# Patient Record
Sex: Male | Born: 1935 | Race: White | Hispanic: No | Marital: Married | Smoking: Never smoker
Health system: Southern US, Community
[De-identification: ages and names within clinical notes are randomized; demographics above are authoritative.]

## PROBLEM LIST (undated history)

## (undated) DIAGNOSIS — I421 Obstructive hypertrophic cardiomyopathy: Secondary | ICD-10-CM

## (undated) DIAGNOSIS — I69251 Hemiplegia and hemiparesis following other nontraumatic intracranial hemorrhage affecting right dominant side: Secondary | ICD-10-CM

## (undated) DIAGNOSIS — I959 Hypotension, unspecified: Secondary | ICD-10-CM

## (undated) DIAGNOSIS — I69351 Hemiplegia and hemiparesis following cerebral infarction affecting right dominant side: Secondary | ICD-10-CM

## (undated) DIAGNOSIS — D696 Thrombocytopenia, unspecified: Secondary | ICD-10-CM

## (undated) DIAGNOSIS — I69391 Dysphagia following cerebral infarction: Secondary | ICD-10-CM

## (undated) DIAGNOSIS — R410 Disorientation, unspecified: Secondary | ICD-10-CM

## (undated) DIAGNOSIS — R1312 Dysphagia, oropharyngeal phase: Secondary | ICD-10-CM

## (undated) DIAGNOSIS — R569 Unspecified convulsions: Secondary | ICD-10-CM

## (undated) DIAGNOSIS — M25551 Pain in right hip: Secondary | ICD-10-CM

## (undated) DIAGNOSIS — R498 Other voice and resonance disorders: Secondary | ICD-10-CM

## (undated) DIAGNOSIS — I69128 Other speech and language deficits following nontraumatic intracerebral hemorrhage: Secondary | ICD-10-CM

## (undated) DIAGNOSIS — I10 Essential (primary) hypertension: Secondary | ICD-10-CM

## (undated) DIAGNOSIS — T7840XA Allergy, unspecified, initial encounter: Secondary | ICD-10-CM

## (undated) DIAGNOSIS — F419 Anxiety disorder, unspecified: Secondary | ICD-10-CM

## (undated) DIAGNOSIS — M6281 Muscle weakness (generalized): Secondary | ICD-10-CM

## (undated) DIAGNOSIS — I69320 Aphasia following cerebral infarction: Secondary | ICD-10-CM

## (undated) DIAGNOSIS — S72001D Fracture of unspecified part of neck of right femur, subsequent encounter for closed fracture with routine healing: Secondary | ICD-10-CM

## (undated) DIAGNOSIS — M47812 Spondylosis without myelopathy or radiculopathy, cervical region: Secondary | ICD-10-CM

## (undated) DIAGNOSIS — K5901 Slow transit constipation: Secondary | ICD-10-CM

## (undated) DIAGNOSIS — S42112D Displaced fracture of body of scapula, left shoulder, subsequent encounter for fracture with routine healing: Secondary | ICD-10-CM

## (undated) DIAGNOSIS — I447 Left bundle-branch block, unspecified: Secondary | ICD-10-CM

## (undated) DIAGNOSIS — Z9889 Other specified postprocedural states: Secondary | ICD-10-CM

## (undated) DIAGNOSIS — J300 Vasomotor rhinitis: Secondary | ICD-10-CM

## (undated) DIAGNOSIS — F331 Major depressive disorder, recurrent, moderate: Secondary | ICD-10-CM

## (undated) DIAGNOSIS — G4700 Insomnia, unspecified: Secondary | ICD-10-CM

## (undated) DIAGNOSIS — I480 Paroxysmal atrial fibrillation: Secondary | ICD-10-CM

## (undated) DIAGNOSIS — E785 Hyperlipidemia, unspecified: Secondary | ICD-10-CM

## (undated) DIAGNOSIS — I69918 Other symptoms and signs involving cognitive functions following unspecified cerebrovascular disease: Secondary | ICD-10-CM

## (undated) HISTORY — PX: INGUINAL HERNIA REPAIR: SHX194

## (undated) HISTORY — DX: Allergy, unspecified, initial encounter: T78.40XA

---
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
## 2017-02-08 ENCOUNTER — Encounter: Payer: Self-pay | Admitting: Physical Therapy

## 2017-02-08 ENCOUNTER — Ambulatory Visit: Payer: Medicare Other | Attending: Orthopedic Surgery | Admitting: Physical Therapy

## 2017-02-08 ENCOUNTER — Other Ambulatory Visit: Payer: Self-pay

## 2017-02-08 DIAGNOSIS — R2689 Other abnormalities of gait and mobility: Secondary | ICD-10-CM | POA: Insufficient documentation

## 2017-02-08 DIAGNOSIS — M542 Cervicalgia: Secondary | ICD-10-CM

## 2017-02-08 DIAGNOSIS — M791 Myalgia, unspecified site: Secondary | ICD-10-CM | POA: Insufficient documentation

## 2017-02-08 NOTE — Patient Instructions (Signed)
[facility]  (dragging arms without lifting   10  Reps     _______  Back of head, and upper arms pressed with slight chin tuck    5 sec holds with breathing x 5      ________  Notice when crossing arms

## 2017-02-10 NOTE — Therapy (Addendum)
[facility] MAIN [facility] SERVICES [address] [place], [place], [address] Phone: [phone]   Fax:  [phone]  Physical Therapy Evaluation  Patient Details  Name: Alvin Hudson MRN: [id number] Date of Birth: 16-Jul-1935 Referring Provider: Leonel Ramsay    Encounter Date: 02/08/2017  PT End of Session - 02/13/17 2040    Visit Number  1    Number of Visits  12    Date for PT Re-Evaluation  05/05/17    PT Start Time  1405    PT Stop Time  1515    PT Time Calculation (min)  70 min    Activity Tolerance  Patient tolerated treatment well;No increased pain    Behavior During Therapy  WFL for tasks assessed/performed       Past Medical History:  Diagnosis Date  . Allergy     Past Surgical History:  Procedure Laterality Date  . INGUINAL HERNIA REPAIR Bilateral     There were no vitals filed for this visit.   Subjective Assessment - 02/13/17 2029    Subjective  Pt reported when he was 60-75 years old, he hurt his neck real badly when playing a game. He is always aware of it and is always in the same place on both sides where the shoulder meets the neck.  50 years ago, pt woke up because the pain was so intense and his wife brought him to the ER.  Pt received a shot that at that time which knocked him out. The next morning the pain was gone.   Since that severe episode, the pain had returned to normal.  Pt has tolerated it. 4-5/10 pain.  Denied tingling/ numbness nor weakness in B arms and hands. Pt performs yoga 1-2 x week and it does not bother him. Pt has stopped doing cobra pose. It did not seem comfortable enough yet it does not increase neck pain.  Pt has had X- rays in the past 10 years ago but has not had updated xrays. 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 pt has not sat over a drawing board for the past 20 years. Pt sleeps 10 hours and uses 2 pillows under his head. Nocturia  2-3x /night.  Pt sleeps on his side  R > L and never on his back nor belly.      Patient Stated Goals  to learn exercises to relief the pain.         [facility] PT Assessment - 02/13/17 2029      Assessment   Medical Diagnosis  shoulder pain     Referring Provider  Votyek       Precautions   Precautions  None      Restrictions   Weight Bearing Restrictions  No      Balance Screen   Has the patient fallen in the past 6 months  No      Observation/Other Assessments   Observations  L scapular delayed in downward rotation compare R, functional reach behind back digit III L at C7, R at T3.  behind back < L digit III T3, R T7         Coordination   Gross Motor Movements are Fluid and Coordinated  -- L scapular delayed in downward rotation      Other:   Other/ Comments  endurance of cervial extensors: 2:02 min befor e fatigue and untolerable pain  AROM   Overall AROM Comments  ext 45 deg ( pain) , flex 45 . L sidebend 45 deg , R 55 deg with pain.  L rotate 40 deg, R 40 deg pain with both rotation      Strength   Overall Strength Comments  L shoulder flexion 4-/5, L 5/5  other  MMT equal  of BUE        Palpation   Palpation comment  reproduction of pain T1-2, R laterally shifted, hypomobilty upper thoraicic              Objective measurements completed on examination: See above findings.      [facility] Adult PT Treatment/Exercise - 02/13/17 2030      Neuro Re-ed    Neuro Re-ed Details   see pt instructions      Manual Therapy   Manual therapy comments  STM at medial border of scapula and inferior mob at L first rib and spine of scapula , STM upper trap, scalenes. levator  order to faciliate downward rotation of L scapula                   PT Long Term Goals - 02/10/17 1433      PT LONG TERM GOAL #1   Title  Pt will decrease his NDI score from 16% to < % in order to improve QOL    Time  12    Period  Weeks    Status  New    Target Date  05/05/17       PT LONG TERM GOAL #2   Title  Pt will demo no deviation of upper thoracic segments , no increased mm tightness at medial border of scapula L in order to progress to deep extensor mm strengthening to support weight of skull and to increase ROM for driving     Time  2    Period  Weeks    Target Date  02/24/17      PT LONG TERM GOAL #3   Title  Pt will demo increased cervical mm endurance from 2 min to 2:30 min in order to minimize pain     Time  8    Period  Weeks    Status  New    Target Date  04/07/17      PT LONG TERM GOAL #4   Title  Pt will demo IND with modificaitons and proper alignment and technique to yoga poses in order to minimize injuries nor worseing of Sx in community yoga classes    Time  10    Period  Weeks    Status  New    Target Date  04/21/17      PT LONG TERM GOAL #5   Title  Pt will demo increased AROM to [facility] in extensions, sidebend, rotation B without pain and without cue for scapular retraction/depression prior to cervical movements in order to ambulate safely with head turns and change lanes in car     Time  4    Period  Weeks    Status  New    Target Date  05/05/17             Plan - 02/13/17 2048    Clinical Impression Statement  Pt is a 82 yo male who reports chronic neck pain that is non-radiating that impacts his QOL. Pt's clinical presentations include L scapular dyskinesis, increased mm tensions medial to L scapula, slight thoracic kyphosis, forward head, weakness  in scapular stabilizing mm, poor sitting posture, and  limited cervical ROM. Pt also sleeps on 2 pillows on his R side more than L and never on his back. Following Tx today, pt reported a decrease from 5/10 to 4/10 and demonstrated decreased mm tensions, increased L downward scapular mobility, and increased propioception of cervical retraction/scapular depression/retraction. Pt also demo'd improved sitting posture.    Clinical Presentation  Stable    Clinical Decision Making  Low     Rehab Potential  Good    PT Frequency  1x / week    PT Duration  12 weeks    PT Treatment/Interventions  Gait training;Therapeutic exercise;Therapeutic activities;Neuromuscular re-education;Moist Heat;Manual techniques;Patient/family education;Scar mobilization;Functional mobility training    Consulted and Agree with Plan of Care  Patient       Patient will benefit from skilled therapeutic intervention in order to improve the following deficits and impairments:  Improper body mechanics, Pain, Postural dysfunction, Increased muscle spasms, Decreased coordination, Decreased mobility, Decreased endurance, Decreased range of motion, Decreased safety awareness, Hypomobility, Decreased strength  Visit Diagnosis: Cervicalgia  Myalgia  Other abnormalities of gait and mobility     Problem List There are no active problems to display for this patient.   [name] ,PT, DPT, E-RYT  02/13/2017, 8:49 PM  [facility] MAIN [facility] SERVICES [address] [place], [place], [address] Phone: [phone]   Fax:  [phone]  Name: Alvin Hudson MRN: [id number] Date of Birth: May 09, 1935

## 2017-02-13 NOTE — Addendum Note (Signed)
Addended by: Mariane MastersYEUNG, SHIN-YIING on: 02/13/2017 08:59 PM   Modules accepted: Orders

## 2017-02-15 ENCOUNTER — Ambulatory Visit: Payer: Medicare Other | Admitting: Physical Therapy

## 2017-02-15 DIAGNOSIS — M542 Cervicalgia: Secondary | ICD-10-CM | POA: Diagnosis not present

## 2017-02-15 DIAGNOSIS — M791 Myalgia, unspecified site: Secondary | ICD-10-CM

## 2017-02-15 DIAGNOSIS — R2689 Other abnormalities of gait and mobility: Secondary | ICD-10-CM

## 2017-02-15 NOTE — Therapy (Signed)
[facility] MAIN [facility] SERVICES [address] [place], [place], [address] Phone: [phone]   Fax:  [phone]  Physical Therapy Treatment  Patient Details  Name: Alvin Hudson MRN: [id number] Date of Birth: Oct 04, 1935 Referring Provider: Leonel Ramsay    Encounter Date: 02/15/2017  PT End of Session - 02/15/17 1558    Visit Number  2    Number of Visits  12    Date for PT Re-Evaluation  05/05/17    PT Start Time  1405    PT Stop Time  1500    PT Time Calculation (min)  55 min    Activity Tolerance  Patient tolerated treatment well;No increased pain    Behavior During Therapy  WFL for tasks assessed/performed       Past Medical History:  Diagnosis Date  . Allergy     Past Surgical History:  Procedure Laterality Date  . INGUINAL HERNIA REPAIR Bilateral     There were no vitals filed for this visit.  Subjective Assessment - 02/15/17 1405    Subjective  Pt reports his neck pain is a 4/10 today, Pt felt good after last session. Pt is doing his exercises once a day     Patient Stated Goals  to exercises to relief the pain.         [facility] PT Assessment - 02/15/17 1406      Coordination   Gross Motor Movements are Fluid and Coordinated  -- R scapular downward rotation limited, L scap normal       AROM   Overall AROM Comments  seated: cervical ext 50 deg no pain, sidebend 45 deg B no pain .  supine: pain on R abduction at ~160 deg ( post Tx: no pain)         Palpation   Palpation comment  hypomobility at T3-5, tensions at upper trap/scalenes/ supraspinatus on R                   [facility] Adult PT Treatment/Exercise - 02/15/17 1555      Neuro Re-ed    Neuro Re-ed Details   see pt instructions. yoga modifications to increase neuromuscular re-ed for cervical/ scap retraction       Manual Therapy   Manual therapy comments  inferior mob at scapula spine, PA mobs at T3-5, STM at [employer] R             PT Education -  02/15/17 1555    Education provided  Yes    Education Details  HEP    Person(s) Educated  Patient    Methods  Explanation;Demonstration;Tactile cues;Verbal cues;Handout    Comprehension  Returned demonstration;Verbalized understanding          PT Long Term Goals - 02/15/17 1604      PT LONG TERM GOAL #1   Title  Pt will decrease his NDI score from 16% to < % in order to improve QOL    Time  12    Period  Weeks    Status  New      PT LONG TERM GOAL #2   Title  Pt will demo no deviation of upper thoracic segments , no increased mm tightness at medial border of scapula L in order to progress to deep extensor mm strengthening to support weight of skull and to increase ROM for driving     Time  2    Period  Weeks      PT LONG TERM  GOAL #3   Title  Pt will demo increased cervical mm endurance from 2 min to 2:30 min in order to minimize pain     Time  8    Period  Weeks    Status  New      PT LONG TERM GOAL #4   Title  Pt will demo IND with modificaitons and proper alignment and technique to yoga poses in order to minimize injuries nor worseing of Sx in community yoga classes    Time  10    Period  Weeks    Status  New      PT LONG TERM GOAL #5   Title  Pt will demo increased AROM to [facility] in extensions, sidebend, rotation B without pain and without cue for scapular retraction/depression prior to cervical movements in order to ambulate safely with head turns and change lanes in car     Time  4    Period  Weeks    Status  New            Plan - 02/15/17 1601    Clinical Impression Statement  Pt showed good carryover from last session and reported today decreased ain compared to last session ( 6/10 to 4/10). Pt showed restored cervical ROM B.   Applied manual Tx to addressed R scapular downward rotation which was limited and related to pain at R abduction at ~160 deg. Pt showed decreased thoracic hypomobility at T3-5 and mm tensions above spine scapula post Tx.  Addressed  proper technique with yoga poses to promote more neuromuscular re-edu for scapular stabilization. Pt demo'd correctly after 3 trials with downward facing dog to standing. Introduced low cobra and locust for neuromuscular training but not added to HEP.  Pt continues to benefit from skilled PT.      Rehab Potential  Good    PT Frequency  1x / week    PT Duration  12 weeks    PT Treatment/Interventions  Gait training;Therapeutic exercise;Therapeutic activities;Neuromuscular re-education;Moist Heat;Manual techniques;Patient/family education;Scar mobilization;Functional mobility training    Consulted and Agree with Plan of Care  Patient       Patient will benefit from skilled therapeutic intervention in order to improve the following deficits and impairments:  Improper body mechanics, Pain, Postural dysfunction, Increased muscle spasms, Decreased coordination, Decreased mobility, Decreased endurance, Decreased range of motion, Decreased safety awareness, Hypomobility, Decreased strength  Visit Diagnosis: Cervicalgia  Myalgia  Other abnormalities of gait and mobility     Problem List There are no active problems to display for this patient.   Mariane MastersYeung,Shin Yiing ,PT, DPT, E-RYT  02/15/2017, 4:05 PM  [facility] MAIN [facility] [address] [place], [place], [address] Phone: [phone]   Fax:  [phone]  Name: Alvin Hudson MRN: [id number] Date of Birth: 06/10/1935

## 2017-02-15 NOTE — Patient Instructions (Signed)
Low cobra  Locust pose  With emphasis chin tuck, shoulders down and together    _________  Modified downward facing dog transition to standing , avoid forward fold in standing  Use: Step halfway to hands, wider, knees behind toes, scoop hips under  Mini squat and elbows back

## 2017-02-22 ENCOUNTER — Ambulatory Visit: Payer: Medicare Other | Admitting: Physical Therapy

## 2017-02-22 DIAGNOSIS — M542 Cervicalgia: Secondary | ICD-10-CM

## 2017-02-22 DIAGNOSIS — M791 Myalgia, unspecified site: Secondary | ICD-10-CM

## 2017-02-22 DIAGNOSIS — R2689 Other abnormalities of gait and mobility: Secondary | ICD-10-CM

## 2017-02-22 NOTE — Patient Instructions (Signed)
   Clam shells propped on L forearm R knee up, propped on forearms   20 reps

## 2017-02-24 NOTE — Therapy (Signed)
[facility] MAIN [facility] SERVICES [address] [place], [place], [address] Phone: [phone]   Fax:  [phone]  Physical Therapy Treatment  Patient Details  Name: Alvin Hudson MRN: [id number] Date of Birth: 1935-08-10 Referring Provider: Leonel Ramsay    Encounter Date: 02/22/2017    Past Medical History:  Diagnosis Date  . Allergy     Past Surgical History:  Procedure Laterality Date  . INGUINAL HERNIA REPAIR Bilateral     There were no vitals filed for this visit.  Subjective Assessment - 02/24/17 1545    Subjective  Pt reported feeling better after leaving last session but 3 days ago, pt woke up with a catch by the left shoulder blade which prevented him from doing his exercises. He notices it today but it is better.     Patient Stated Goals  to exercises to relief the pain.         [facility] PT Assessment - 02/24/17 1545      Observation/Other Assessments   Observations  5 cm shorter on L ASIS to medial malleoli       Coordination   Gross Motor Movements are Fluid and Coordinated  -- L scap delayed in downward rotation      AROM   Overall AROM Comments  cervical rotation 30 deg L, 40 deg R       Strength   Overall Strength Comments  R retraction 3/5, L 4/5. R hip abd 3+/5, L 4/5.       Palpation   Spinal mobility  L lumbar curve more posterior. minor tensions at L iliocostalis mm. limited costovertebral joins on L posterior ribs     SI assessment   R iliac crest higher >L .                  [facility] Adult PT Treatment/Exercise - 02/24/17 1545      Neuro Re-ed    Neuro Re-ed Details   see pt instructions.      Manual Therapy   Manual therapy comments  STM medial to L scapula , interspinals       provided shoe lift in  L shoe             PT Long Term Goals - 02/15/17 1604      PT LONG TERM GOAL #1   Title  Pt will decrease his NDI score from 16% to < % in order to improve QOL    Time  12    Period   Weeks    Status  New      PT LONG TERM GOAL #2   Title  Pt will demo no deviation of upper thoracic segments , no increased mm tightness at medial border of scapula L in order to progress to deep extensor mm strengthening to support weight of skull and to increase ROM for driving     Time  2    Period  Weeks      PT LONG TERM GOAL #3   Title  Pt will demo increased cervical mm endurance from 2 min to 2:30 min in order to minimize pain     Time  8    Period  Weeks    Status  New      PT LONG TERM GOAL #4   Title  Pt will demo IND with modificaitons and proper alignment and technique to yoga poses in order to minimize injuries nor worseing of Sx in community yoga  classes    Time  10    Period  Weeks    Status  New      PT LONG TERM GOAL #5   Title  Pt will demo increased AROM to [facility] in extensions, sidebend, rotation B without pain and without cue for scapular retraction/depression prior to cervical movements in order to ambulate safely with head turns and change lanes in car     Time  4    Period  Weeks    Status  New            Plan - 02/24/17 1543    Clinical Impression Statement Pt showed shorter L leg length compared to R with slight R thoracolumbar curve and R glut weakness, L para/interspinal mm tightness. Pt was provided a shoe lift in L shoe and R glut strengthening HEP. Pt's cervical ROM is improving but suspect his slightly scoliosis related imbalances (  L back and R lower kinetic chain) are involved and needs to be addressed for longer lasting benefits. Pt continues to benefit skilled PT.   Rehab Potential  Good    PT Frequency  1x / week    PT Duration  12 weeks    PT Treatment/Interventions  Gait training;Therapeutic exercise;Therapeutic activities;Neuromuscular re-education;Moist Heat;Manual techniques;Patient/family education;Scar mobilization;Functional mobility training    Consulted and Agree with Plan of Care  Patient       Patient will benefit from skilled  therapeutic intervention in order to improve the following deficits and impairments:  Improper body mechanics, Pain, Postural dysfunction, Increased muscle spasms, Decreased coordination, Decreased mobility, Decreased endurance, Decreased range of motion, Decreased safety awareness, Hypomobility, Decreased strength  Visit Diagnosis: Myalgia  Cervicalgia  Other abnormalities of gait and mobility     Problem List There are no active problems to display for this patient.   Mariane MastersYeung,Shin Yiing  ,PT, DPT, E-RYT  02/24/2017, 3:46 PM  [place] [facility] MAIN [facility] [address] [place], [place], [address] Phone: [phone]   Fax:  [phone]  Name: Alvin Hudson MRN: [id number] Date of Birth: 11/28/1935

## 2017-03-01 ENCOUNTER — Ambulatory Visit: Payer: Medicare Other | Attending: Orthopedic Surgery | Admitting: Physical Therapy

## 2017-03-01 DIAGNOSIS — M791 Myalgia, unspecified site: Secondary | ICD-10-CM | POA: Insufficient documentation

## 2017-03-01 DIAGNOSIS — R2689 Other abnormalities of gait and mobility: Secondary | ICD-10-CM | POA: Diagnosis present

## 2017-03-01 DIAGNOSIS — M542 Cervicalgia: Secondary | ICD-10-CM | POA: Diagnosis present

## 2017-03-01 NOTE — Patient Instructions (Signed)
Back turned away from door  Pull band in R hand, shoulder down and back  As you walk forward and back   10 reps      Wall lean for scoliosis   Forearm slide against wall as you stand perpendicular to wall, band under feet, feet hip width apart,  R  elbow by side, , imagine holding pencil under armpit as you lean toward wall, lowering forearm against the wall and opposite hand pulls band without letting elbow move away from side body  And slide back up , straightening body    Make sure the upper trapezius muscle does not hike up to ear as band is being pulled as you lean towards wall   10 x 2

## 2017-03-03 NOTE — Therapy (Signed)
[facility] [address] [place], [place], [address] Phone: [phone]   Fax:  [phone]  Physical Therapy Treatment  Patient Details  Name: Alvin BroodDon Cua MRN: [id number] Date of Birth: 09/17/1935 Referring Provider: Leonel RamsayVotyek    Encounter Date: 03/01/2017  PT End of Session - 03/03/17 1731    Visit Number  4    Number of Visits  12    Date for PT Re-Evaluation  05/05/17    PT Start Time  1405    PT Stop Time  1500    PT Time Calculation (min)  55 min    Activity Tolerance  Patient tolerated treatment well;No increased pain    Behavior During Therapy  WFL for tasks assessed/performed       Past Medical History:  Diagnosis Date  . Allergy     Past Surgical History:  Procedure Laterality Date  . INGUINAL HERNIA REPAIR Bilateral     There were no vitals filed for this visit.  Subjective Assessment - 03/03/17 1735    Subjective  Pt reported he is still feeling the catch behind the L shoulder blade and there is pain when he moves his arms in "angel wing" exercise    Patient Stated Goals  to exercises to relief the pain.         [facility] - 03/03/17 1730      Observation/Other Assessments   Observations  post Tx: less report of pulling on the back of R upper arm during shoulder abduction on R       Coordination   Gross Motor Movements are Fluid and Coordinated  -- improved scap downrotation post Tx , less winging of R       Palpation   Spinal mobility  interspinals at T3 and rhombhoids L with tensions and tenderness, slight tingling along the top of shoulder blade ( post Tx: no tingling and less tightenss /tenderness)                   [facility] Adult PT Treatment/Exercise - 03/03/17 1727      Manual Therapy   Manual therapy comments  STM medial to L scapula , interspinals, PA mob /medial glide in supine at L, T 4-6 segments        Neuromuscular reedu   See pt instructions              PT Long Term Goals - 03/03/17 1733      PT LONG TERM GOAL #1   Title  Pt will decrease his NDI score from 16% to < % in order to improve QOL    Time  12    Period  Weeks    Status  On-going      PT LONG TERM GOAL #2   Title  Pt will demo no deviation of upper thoracic segments , no increased mm tightness at medial border of scapula L in order to progress to deep extensor mm strengthening to support weight of skull and to increase ROM for driving     Time  2    Period  Weeks    Status  On-going      PT LONG TERM GOAL #3   Title  Pt will demo increased cervical mm endurance from 2 min to 2:30 min in order to minimize pain     Time  8    Period  Weeks    Status  On-going  PT LONG TERM GOAL #4   Title  Pt will demo IND with modificaitons and proper alignment and technique to yoga poses in order to minimize injuries nor worseing of Sx in community yoga classes    Time  10    Period  Weeks    Status  On-going      PT LONG TERM GOAL #5   Title  Pt will demo increased AROM to [facility] in extensions, sidebend, rotation B without pain and without cue for scapular retraction/depression prior to cervical movements in order to ambulate safely with head turns and change lanes in car     Time  4    Period  Weeks    Status  On-going      Additional Long Term Goals   Additional Long Term Goals  Yes      PT LONG TERM GOAL #6   Title  Pt will be IND with HEP customized for scoliosis    Time  8    Period  Weeks    Status  New    Target Date  04/28/17            Plan - 03/03/17 1731    Clinical Impression Statement  Pt showed improved scapular downward rotation after manual Tx which targeted tight muscles at T3, rhomboids and slightly lateral shifted T3-6 segments.  Pt reported no tingling and pain with shoulder abduction in supine position post Tx. Added HEP for upper spine , customized for scoliosis.  At next session, plan to address increased L paraspinal mm  compared to R. Pt continues to benefit from skilled PT.      Rehab Potential  Good    PT Frequency  1x / week    PT Duration  12 weeks    PT Treatment/Interventions  Gait training;Therapeutic exercise;Therapeutic activities;Neuromuscular re-education;Moist Heat;Manual techniques;Patient/family education;Scar mobilization;Functional mobility training    Consulted and Agree with Plan of Care  Patient       Patient will benefit from skilled therapeutic intervention in order to improve the following deficits and impairments:  Improper body mechanics, Pain, Postural dysfunction, Increased muscle spasms, Decreased coordination, Decreased mobility, Decreased endurance, Decreased range of motion, Decreased safety awareness, Hypomobility, Decreased strength  Visit Diagnosis: Myalgia  Cervicalgia  Other abnormalities of gait and mobility     Problem List There are no active problems to display for this patient.   [name] ,PT, DPT, E-RYT    03/03/2017, 5:35 PM  [facility] MAIN [facility] SERVICES [address] [place], [place], [address] Phone: [phone]   Fax:  [phone]  Name: Doroteo Nickolson MRN: [id number] Date of Birth: 25-Mar-1935

## 2017-03-15 ENCOUNTER — Ambulatory Visit: Payer: Medicare Other | Admitting: Physical Therapy

## 2017-03-15 DIAGNOSIS — M791 Myalgia, unspecified site: Secondary | ICD-10-CM

## 2017-03-15 DIAGNOSIS — R2689 Other abnormalities of gait and mobility: Secondary | ICD-10-CM

## 2017-03-15 DIAGNOSIS — M542 Cervicalgia: Secondary | ICD-10-CM

## 2017-03-15 NOTE — Patient Instructions (Addendum)
Mini Lunges  10 reps on R side only      Feet are hip width apart, L foot one behind like you are on ski tracks,   R knee bent over ankle but not more forward then the ankle.   Make sure 50% weight is in the front foot/leg , 50% weight is the back foot/ leg    Back toes tucked under, lower back knee while maintaining front knee above ankle.  Feel the power in the Back Buttocks as you straighten knee   Hand on wall      ______________  Tug-a war with green band doubled  10reps x 2    Ski track stance, L in front, R foot is back, knees slightly bent   Shoulders down and back Pull R elbow back on exhale    ________________  Raising arm up, make sure to squeeze shoulders down and back   Low cobra [place]ust  Warrior I

## 2017-03-15 NOTE — Therapy (Signed)
[facility] MAIN [facility] SERVICES [address] [place], [place], [address] Phone: [phone]   Fax:  [phone]  Physical Therapy Treatment  Patient Details  Name: Cordie Buening MRN: [id number] Date of Birth: [birth year] Referring Provider: Leonel Ramsay    Encounter Date: 03/15/2017  PT End of Session - 03/15/17 2224    Visit Number  5    Number of Visits  12    Date for PT Re-Evaluation  05/05/17    PT Start Time  1405    PT Stop Time  1500    PT Time Calculation (min)  55 min    Activity Tolerance  Patient tolerated treatment well;No increased pain    Behavior During Therapy  WFL for tasks assessed/performed       Past Medical History:  Diagnosis Date  . Allergy     Past Surgical History:  Procedure Laterality Date  . INGUINAL HERNIA REPAIR Bilateral     There were no vitals filed for this visit.  Subjective Assessment - 03/15/17 1410    Subjective  Pt reports the pain that runs up the R upper arm is not has painful during the "snow angel" and raising his arms up in yoga poses.  Pt no longer has the catch behind his L shoulder blade. The reamining ache in the upper shoulder mm.      Patient Stated Goals  to exercises to relief the pain.         [facility] PT Assessment - 03/15/17 2223      Observation/Other Assessments   Observations  less radiating upper arm pain with cue to squeeze shoudler down and back .  Showed dyskinesis without cue       Other:   Other/ Comments  front lunge with knees more anterior to ankle ( improved alignment wi th tactile and visual cues)       Strength   Overall Strength Comments  hip abd R 3+/5       Palpation   Palpation comment  no tightness in previous areas in thoracic area.  increased tightenss in upper trap where pt reported achiness.                  [facility] Adult PT Treatment/Exercise - 03/15/17 2224      Neuro Re-ed    Neuro Re-ed Details   see pt instructions.              PT Education - 03/15/17 2224    Education provided  Yes    Education Details  HEP    Person(s) Educated  Patient    Methods  Explanation;Demonstration;Tactile cues;Verbal cues;Handout    Comprehension  Returned demonstration;Verbalized understanding          PT Long Term Goals - 03/03/17 1733      PT LONG TERM GOAL #1   Title  Pt will decrease his NDI score from 16% to < % in order to improve QOL    Time  12    Period  Weeks    Status  On-going      PT LONG TERM GOAL #2   Title  Pt will demo no deviation of upper thoracic segments , no increased mm tightness at medial border of scapula L in order to progress to deep extensor mm strengthening to support weight of skull and to increase ROM for driving     Time  2    Period  Weeks    Status  On-going      PT LONG TERM GOAL #3   Title  Pt will demo increased cervical mm endurance from 2 min to 2:30 min in order to minimize pain     Time  8    Period  Weeks    Status  On-going      PT LONG TERM GOAL #4   Title  Pt will demo IND with modificaitons and proper alignment and technique to yoga poses in order to minimize injuries nor worseing of Sx in community yoga classes    Time  10    Period  Weeks    Status  On-going      PT LONG TERM GOAL #5   Title  Pt will demo increased [facility]OM to [facility] extensions, sidebend, rotation B without pain and without cue for scapular retraction/depression prior to cervical movements in order to ambulate safely with head turns and change lanes in car     Time  4    Period  Weeks    Status  On-going      Additional Long Term Goals   Additional Long Term Goals  Yes      PT LONG TERM GOAL #6   Title  Pt will be IND with HEP customized for scoliosis    Time  8    Period  Weeks    Status  New    Target Date  04/28/17            Plan - 03/15/17 2229    Clinical Impression Statement  Pt is progressing well with customized scoliosis HEP. Advanced pt propioception and  alignment training to promote improved balance. Pt presents with hypermobility and required emphasis on joint stability and body mechanics training with floor <> stand transfers from half kneeling and yoga poses.  Pt demo'd improved scapular control in functional positions and in yoga poses. Pt continues to be compliant with yoga modifications in his community classes. Pt continues to benefit from skilled PT.     Rehab Potential  Good    PT Frequency  1x / week    PT Duration  12 weeks    PT Treatment/Interventions  Gait training;Therapeutic exercise;Therapeutic activities;Neuromuscular re-education;Moist Heat;Manual techniques;Patient/family education;Scar mobilization;Functional mobility training    Consulted and Agree with Plan of Care  Patient       Patient will benefit from skilled therapeutic intervention in order to improve the following deficits and impairments:  Improper body mechanics, Pain, Postural dysfunction, Increased muscle spasms, Decreased coordination, Decreased mobility, Decreased endurance, Decreased range of motion, Decreased safety awareness, Hypomobility, Decreased strength  Visit Diagnosis: Myalgia  Cervicalgia  Other abnormalities of gait and mobility     Problem List There are no active problems to display for this patient.   Mariane MastersYeung,Shin Yiing ,PT, DPT, E-RYT  03/15/2017, 10:29 PM  [facility] [address] [place], [place], [address] Phone: [phone]   Fax:  [phone]  Name: Kipp BroodDon Upson MRN: [id number] Date of Birth: 09/06/1935

## 2017-03-29 ENCOUNTER — Ambulatory Visit: Payer: Medicare Other | Attending: Orthopedic Surgery | Admitting: Physical Therapy

## 2017-03-29 DIAGNOSIS — M791 Myalgia, unspecified site: Secondary | ICD-10-CM | POA: Insufficient documentation

## 2017-03-29 DIAGNOSIS — M542 Cervicalgia: Secondary | ICD-10-CM

## 2017-03-29 DIAGNOSIS — R2689 Other abnormalities of gait and mobility: Secondary | ICD-10-CM | POA: Diagnosis present

## 2017-03-29 NOTE — Patient Instructions (Signed)
Lions breath   Palm pressed all four corners, elbows straight and wrap around motion of shoulder blades  ... The neck lengthens long and the the lions breath  3 reps    Wall push ups with Palm pressed all four corners, elbows bent, and pointed down tot he floor, and wrap around motion of shoulder blade Without popping ribs forward  5

## 2017-03-30 NOTE — Therapy (Signed)
[place] [facility] MAIN [facility] SERVICES [address] [place], [place], [address] Phone: [phone]   Fax:  [phone]  Physical Therapy Treatment  Patient Details  Name: Alvin Hudson MRN: [id number] Date of Birth: 1935/12/11 Referring Provider: Leonel Ramsay    Encounter Date: 03/29/2017  PT End of Session - 03/30/17 0845    Visit Number  6    Number of Visits  12    Date for PT Re-Evaluation  05/05/17    PT Start Time  1400    PT Stop Time  1500    PT Time Calculation (min)  60 min    Activity Tolerance  Patient tolerated treatment well;No increased pain    Behavior During Therapy  WFL for tasks assessed/performed       Past Medical History:  Diagnosis Date  . Allergy     Past Surgical History:  Procedure Laterality Date  . INGUINAL HERNIA REPAIR Bilateral     There were no vitals filed for this visit.  Subjective Assessment - 03/29/17 1409    Subjective  Pt feels tightness in the upper shoulders, on either of his neck     Patient Stated Goals  to exercises to relief the pain.         [facility] PT Assessment - 03/30/17 0842      Observation/Other Assessments   Observations  difficulty with scap retraction/depression dissassociation with elbow mobility      Palpation   Spinal mobility  signficant tensions in upper trap /levator L, interspinal mm tensions at T3     Palpation comment  delayed downward rotation on R with shoulder adductor,  upward rotation of scap B with overactivity of upper trap. Improved post Tx                    [facility] Adult PT Treatment/Exercise - 03/30/17 0840      Neuro Re-ed    Neuro Re-ed Details   see pt instructions.      Manual Therapy   Manual therapy comments  R lateral glide at T3, distraction at lower cervical segments with upper trap/levator STM              PT Education - 03/30/17 0845    Education provided  Yes    Education Details  HEP    Person(s) Educated  Patient    Methods   Explanation;Demonstration;Tactile cues;Verbal cues;Handout    Comprehension  Returned demonstration;Verbalized understanding;Tactile cues required;Verbal cues required          PT Long Term Goals - 03/03/17 1733      PT LONG TERM GOAL #1   Title  Pt will decrease his NDI score from 16% to < % in order to improve QOL    Time  12    Period  Weeks    Status  On-going      PT LONG TERM GOAL #2   Title  Pt will demo no deviation of upper thoracic segments , no increased mm tightness at medial border of scapula L in order to progress to deep extensor mm strengthening to support weight of skull and to increase ROM for driving     Time  2    Period  Weeks    Status  On-going      PT LONG TERM GOAL #3   Title  Pt will demo increased cervical mm endurance from 2 min to 2:30 min in order to minimize pain  Time  8    Period  Weeks    Status  On-going      PT LONG TERM GOAL #4   Title  Pt will demo IND with modificaitons and proper alignment and technique to yoga poses in order to minimize injuries nor worseing of Sx in community yoga classes    Time  10    Period  Weeks    Status  On-going      PT LONG TERM GOAL #5   Title  Pt will demo increased AROM to [facility] in extensions, sidebend, rotation B without pain and without cue for scapular retraction/depression prior to cervical movements in order to ambulate safely with head turns and change lanes in car     Time  4    Period  Weeks    Status  On-going      Additional Long Term Goals   Additional Long Term Goals  Yes      PT LONG TERM GOAL #6   Title  Pt will be IND with HEP customized for scoliosis    Time  8    Period  Weeks    Status  New    Target Date  04/28/17            Plan - 03/30/17 0846    Clinical Impression Statement  Pt showed improved scapular downward rotation of L scapula post Tx and reported decreased tightness at his upper trap muscles bilaterally after Tx. Initiated serratus anterior activation  exercises for proper scapular control. Added scapular depression/ retraction neuromuscular reedu into HEP. Pt showed difficulty  with isolating this motion but demo'd improved control post Tx.  Pt continues to benefit from skilled PT.     Rehab Potential  Good    PT Frequency  1x / week    PT Duration  12 weeks    PT Treatment/Interventions  Gait training;Therapeutic exercise;Therapeutic activities;Neuromuscular re-education;Moist Heat;Manual techniques;Patient/family education;Scar mobilization;Functional mobility training    Consulted and Agree with Plan of Care  Patient       Patient will benefit from skilled therapeutic intervention in order to improve the following deficits and impairments:  Improper body mechanics, Pain, Postural dysfunction, Increased muscle spasms, Decreased coordination, Decreased mobility, Decreased endurance, Decreased range of motion, Decreased safety awareness, Hypomobility, Decreased strength  Visit Diagnosis: Myalgia  Cervicalgia  Other abnormalities of gait and mobility     Problem List There are no active problems to display for this patient.   Mariane MastersYeung,Shin Yiing ,PT, DPT, E-RYT  03/30/2017, 8:54 AM  [place] [address] [place], [place], [address] Phone: [phone]   Fax:  [phone]  Name: Kipp BroodDon Sellers MRN: [id number] Date of Birth: 09/10/1935

## 2017-04-12 ENCOUNTER — Ambulatory Visit: Payer: Medicare Other | Admitting: Physical Therapy

## 2017-04-26 ENCOUNTER — Ambulatory Visit: Payer: Medicare Other | Attending: Orthopedic Surgery | Admitting: Physical Therapy

## 2017-04-26 DIAGNOSIS — M542 Cervicalgia: Secondary | ICD-10-CM | POA: Insufficient documentation

## 2017-04-26 DIAGNOSIS — R2689 Other abnormalities of gait and mobility: Secondary | ICD-10-CM | POA: Diagnosis present

## 2017-04-26 DIAGNOSIS — M791 Myalgia, unspecified site: Secondary | ICD-10-CM | POA: Diagnosis not present

## 2017-04-26 NOTE — Patient Instructions (Addendum)
10 reps x 2 sideplank by the wall   Modified dolphin plank on forearms  ( squeeze " armpit muscles")  10 reps    Walking forward and back ward with band at B hips  Center of mass forward, sink into the floor Forward: ballmound down,  Backward ballmound then lower heel  While keeping center of mass forward

## 2017-04-29 NOTE — Therapy (Signed)
[place] [facility] [address] , [place], [address] Phone: [phone]   Fax:  [phone]  Physical Therapy Treatment Michael Litter/Progress Note  Patient Details  Name: Alvin Hudson MRN: [id number] Date of Birth: 09/27/1935 Referring Provider: Leonel RamsayVotyek    Encounter Date: 04/26/2017  PT End of Session - 04/29/17 1336    Visit Number  7    Number of Visits  12    Date for PT Re-Evaluation  07/19/17    PT Start Time  1305    PT Stop Time  1400    PT Time Calculation (min)  55 min    Activity Tolerance  Patient tolerated treatment well;No increased pain    Behavior During Therapy  WFL for tasks assessed/performed       Past Medical History:  Diagnosis Date  . Allergy     Past Surgical History:  Procedure Laterality Date  . INGUINAL HERNIA REPAIR Bilateral     There were no vitals filed for this visit.      [facility] - 04/29/17 1335      Observation/Other Assessments   Observations  poor eccentric control with band at waist on backward walking      Coordination   Gross Motor Movements are Fluid and Coordinated  -- scapular downward rotation equal and no delay      Balance   Balance comment  ankle strategies to pertubation at waist, delayed stepping strategies                    [facility] Adult PT Treatment/Exercise - 04/29/17 1335      Neuro Re-ed    Neuro Re-ed Details   see pt instructions, 5 sts of 20 ft perturbation training to increase stepping strategies .                  PT Long Term Goals - 04/26/17 1314      PT LONG TERM GOAL #1   Title  Pt will decrease his NDI score from 16% to < % in order to improve QOL    Time  12    Period  Weeks    Status  On-going      PT LONG TERM GOAL #2   Title  Pt will demo no deviation of upper thoracic segments , no increased mm tightness at medial border of scapula L in order to progress to deep extensor mm strengthening to support weight of  skull and to increase ROM for driving     Time  2    Period  Weeks    Status  Achieved      PT LONG TERM GOAL #3   Title  Pt will demo increased cervical mm endurance from 2 min to 2:30 min in order to minimize pain     Time  8    Period  Weeks    Status  Achieved      PT LONG TERM GOAL #4   Title  Pt will demo IND with modificaitons and proper alignment and technique to yoga poses in order to minimize injuries nor worseing of Sx in community yoga classes    Time  10    Period  Weeks    Status  Achieved      PT LONG TERM GOAL #5   Title  Pt will demo increased AROM to [facility] in extensions, sidebend, rotation B without pain and without cue for scapular retraction/depression prior to  cervical movements in order to ambulate safely with head turns and change lanes in car     Time  4    Period  Weeks    Status  Achieved      Additional Long Term Goals   Additional Long Term Goals  Yes      PT LONG TERM GOAL #6   Title  Pt will be IND with HEP customized for scoliosis    Time  8    Period  Weeks    Status  Achieved      PT LONG TERM GOAL #7   Title  Pt will demo improved scapular stabilization neuromuscular control in body weight positions and yoga postures in order to minimize overuse of upper trap and return of neck pain    Time  8    Period  Weeks    Status  New    Target Date  06/24/17      PT LONG TERM GOAL #8   Title  Pt will demo no delay to stepping strategies with pertubations at the waist in order to improve reflexes, postural stability, and to minimize risk for falls    Time  12    Period  Weeks    Status  New    Target Date  07/22/17            Plan - 04/29/17 1335    Clinical Impression Statement  Pt has achieved 6/8 goals and is progressing well towards his remaining goals.  Pt has been able to perform yoga poses with less complaint of neck issues. Pt also showed increased cervical extensor mm endurance. Pt has demo'd improved posture, less deviations to his  scoliosis -related mm/ spinal segments, increased neuromuscular control of scapula/ shoulder complex.Pt's remaining issues include overuse of upper trap and will continue to require shoulder stabilization training and strengthening. Today, pt showed poor dynamic postural stability to perturbation training and used ankle strategies over stepping strategies. Pt will require balance training to minimize risk for falls and to train co-activation of lower kinetic chain as pt's leg length difference has been addressed. Pt will continues to benefit from skilled PT.        Rehab Potential  Good    PT Frequency  1x / week    PT Duration  12 weeks    PT Treatment/Interventions  Gait training;Therapeutic exercise;Therapeutic activities;Neuromuscular re-education;Moist Heat;Manual techniques;Patient/family education;Scar mobilization;Functional mobility training    Consulted and Agree with Plan of Care  Patient       Patient will benefit from skilled therapeutic intervention in order to improve the following deficits and impairments:  Improper body mechanics, Pain, Postural dysfunction, Increased muscle spasms, Decreased coordination, Decreased mobility, Decreased endurance, Decreased range of motion, Decreased safety awareness, Hypomobility, Decreased strength  Visit Diagnosis: Myalgia  Cervicalgia  Other abnormalities of gait and mobility     Problem List There are no active problems to display for this patient.   Alvin Hudson 04/29/2017, 1:46 PM  [facility] MAIN [facility] SERVICES [address] [place], [place], [address] Phone: [phone]   Fax:  [phone]  Name: Alvin Hudson MRN: [id number] Date of Birth: 03-27-1935

## 2017-05-10 ENCOUNTER — Ambulatory Visit: Payer: Medicare Other | Admitting: Physical Therapy

## 2017-05-10 DIAGNOSIS — M791 Myalgia, unspecified site: Secondary | ICD-10-CM | POA: Diagnosis not present

## 2017-05-10 DIAGNOSIS — R2689 Other abnormalities of gait and mobility: Secondary | ICD-10-CM

## 2017-05-10 DIAGNOSIS — M542 Cervicalgia: Secondary | ICD-10-CM

## 2017-05-10 NOTE — Patient Instructions (Signed)
   band under ballmounds  while laying on back w/ knees bent  Or seated (MAKE SURE TO NOT OVER ARCH THE BACK)   "W" exercise  10 reps x 3  sets   Band is placed under feet, knees bent, feet are hip width apart Hold band with thumbs point out, keep upper arm and elbow touching the bed the whole time  - inhale and then exhale pull bands by bending elbows hands move in a "w"  (feel shoulder blades squeezing)   ___________ Laying on back  Red band at the thigh, yellow band at hands Exhale:  "pull band apart with upper arms, nack pressed to the floor ( "pulling dough" while squeezing armpit muscle down and back )  At the thigh, open the thigh out ~ 30 deg against the red band  10 reps x 3  X 1x day

## 2017-05-10 NOTE — Therapy (Signed)
[place] [facility] MAIN [facility] SERVICES [address] [place], [place], [address] Phone: [phone]   Fax:  [phone]  Physical Therapy Treatment  Patient Details  Name: Alvin Hudson MRN: [id number] Date of Birth: [birth year] Referring Provider: Leonel Ramsay    Encounter Date: 05/10/2017  PT End of Session - 05/10/17 1400    Visit Number  8    Number of Visits  12    Date for PT Re-Evaluation  07/19/17    PT Start Time  1305    PT Stop Time  1400    PT Time Calculation (min)  55 min    Activity Tolerance  Patient tolerated treatment well;No increased pain    Behavior During Therapy  WFL for tasks assessed/performed       Past Medical History:  Diagnosis Date  . Allergy     Past Surgical History:  Procedure Laterality Date  . INGUINAL HERNIA REPAIR Bilateral     There were no vitals filed for this visit.  Subjective Assessment - 05/10/17 1312    Subjective  Pt continues to feel constant ache at both upper trap mm, but not more pain at the middle of the neck. PT has not done his HEP this past week    Patient Stated Goals  to exercises to relief the pain.         [facility] PT Assessment - 05/10/17 1313      Observation/Other Assessments   Observations  scpaular dyskinesis ( R delayed)       AROM   Overall AROM Comments  report of pain at ~100 deg shoulder ab R, post Tx: ~120 deg with cue for scap retraction / depression      Strength   Overall Strength Comments  RUE 3/5, LUE 4/5 for retraction, lower trap       Palpation   Palpation comment  increased tightness at anterior [facility] joint attachments of pects                    [facility] Adult PT Treatment/Exercise - 05/10/17 1354      Neuro Re-ed    Neuro Re-ed Details   see pt instructions, 5 sts of 20 ft perturbation training to increase stepping strategies .      Manual Therapy   Manual therapy comments  R long axis distraction, PA stability on R acromium, MWM with cues for  retraction/depression,  STM at anterior attachments of GH joint -Pect mm attachments                   PT Long Term Goals - 04/26/17 1314      PT LONG TERM GOAL #1   Title  Pt will decrease his NDI score from 16% to < % in order to improve QOL    Time  12    Period  Weeks    Status  On-going      PT LONG TERM GOAL #2   Title  Pt will demo no deviation of upper thoracic segments , no increased mm tightness at medial border of scapula L in order to progress to deep extensor mm strengthening to support weight of skull and to increase ROM for driving     Time  2    Period  Weeks    Status  Achieved      PT LONG TERM GOAL #3   Title  Pt will demo increased cervical mm endurance from 2 min to 2:30 min  in order to minimize pain     Time  8    Period  Weeks    Status  Achieved      PT LONG TERM GOAL #4   Title  Pt will demo IND with modificaitons and proper alignment and technique to yoga poses in order to minimize injuries nor worseing of Sx in community yoga classes    Time  10    Period  Weeks    Status  Achieved      PT LONG TERM GOAL #5   Title  Pt will demo increased [facility]OM to [facility] extensions, sidebend, rotation B without pain and without cue for scapular retraction/depression prior to cervical movements in order to ambulate safely with head turns and change lanes in car     Time  4    Period  Weeks    Status  Achieved      Additional Long Term Goals   Additional Long Term Goals  Yes      PT LONG TERM GOAL #6   Title  Pt will be IND with HEP customized for scoliosis    Time  8    Period  Weeks    Status  Achieved      PT LONG TERM GOAL #7   Title  Pt will demo improved scapular stabilization neuromuscular control in body weight positions and yoga postures in order to minimize overuse of upper trap and return of neck pain    Time  8    Period  Weeks    Status  New    Target Date  06/24/17      PT LONG TERM GOAL #8   Title  Pt will demo no delay to stepping  strategies with pertubations at the waist in order to improve reflexes, postural stability, and to minimize risk for falls    Time  12    Period  Weeks    Status  New    Target Date  07/22/17            Plan - 05/10/17 1401    Clinical Impression Statement  Pt continues to progress well with increased shoulder abduction and less upper trap mm tensions post TX today. Addressed tight R pectoralis mm and initiated scapular retraction/ depressionmotor control training with shoulder abduction movement.  Anticipate scapular stabilization strengthening will help minimize pt's remaining complaints of upper trap achiness. 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  Pt continues to benefit from skilled PT    Rehab Potential  Good    PT Frequency  1x / week    PT Duration  12 weeks    PT Treatment/Interventions  Gait training;Therapeutic exercise;Therapeutic activities;Neuromuscular re-education;Moist Heat;Manual techniques;Patient/family education;Scar mobilization;Functional mobility training    Consulted and Agree with Plan of Care  Patient       Patient will benefit from skilled therapeutic intervention in order to improve the following deficits and impairments:  Improper body mechanics, Pain, Postural dysfunction, Increased muscle spasms, Decreased coordination, Decreased mobility, Decreased endurance, Decreased range of motion, Decreased safety awareness, Hypomobility, Decreased strength  Visit Diagnosis: Myalgia  Cervicalgia  Other abnormalities of gait and mobility     Problem List There are no active problems to display for this patient.   Mariane MastersYeung,Shin Yiing ,PT, DPT, E-RYT  05/10/2017, 2:06 PM  [place] [facility]IN [facility] [address] [place], [place],  [address] Phone: [phone]   Fax:  [phone]  Name: Alvin Hudson MRN: [id number] Date of Birth: 23-Feb-1935

## 2017-05-24 ENCOUNTER — Ambulatory Visit: Payer: Medicare Other | Admitting: Physical Therapy

## 2017-05-24 DIAGNOSIS — R2689 Other abnormalities of gait and mobility: Secondary | ICD-10-CM

## 2017-05-24 DIAGNOSIS — M791 Myalgia, unspecified site: Secondary | ICD-10-CM | POA: Diagnosis not present

## 2017-05-24 DIAGNOSIS — M542 Cervicalgia: Secondary | ICD-10-CM

## 2017-05-24 NOTE — Therapy (Signed)
[place] [facility] MAIN [facility] SERVICES [address] [place], [place], [address] Phone: [phone]   Fax:  [phone]  Physical Therapy Treatment  Patient Details  Name: Alvin Hudson MRN: [id number] Date of Birth: [birth year] Referring Provider: Leonel Ramsay    Encounter Date: 05/24/2017  PT End of Session - 05/24/17 1714    Visit Number  9    Number of Visits  12    Date for PT Re-Evaluation  07/19/17    PT Start Time  1713    PT Stop Time  1751    PT Time Calculation (min)  38 min    Activity Tolerance  Patient tolerated treatment well;No increased pain    Behavior During Therapy  WFL for tasks assessed/performed       Past Medical History:  Diagnosis Date  . Allergy     Past Surgical History:  Procedure Laterality Date  . INGUINAL HERNIA REPAIR Bilateral     There were no vitals filed for this visit.      [facility] PT Assessment - 05/24/17 1731      Observation/Other Assessments   Observations  minor cues for cervical retraction with new HEP      Ambulation/Gait   Gait Comments  perturbation at waist:  locked knees, poor carry over                    [facility] Adult PT Treatment/Exercise - 05/24/17 1730      Therapeutic Activites    Therapeutic Activities  -- stepping strategies with perturbations at waist 4 laps10'      Exercises   Exercises  -- see pt instructions              PT Education - 05/24/17 1800    Education provided  Yes    Education Details  HEP    Person(s) Educated  Patient    Methods  Explanation;Demonstration;Tactile cues;Verbal cues;Handout    Comprehension  Returned demonstration;Verbalized understanding          PT Long Term Goals - 04/26/17 1314      PT LONG TERM GOAL #1   Title  Pt will decrease his NDI score from 16% to < % in order to improve QOL    Time  12    Period  Weeks    Status  On-going      PT LONG TERM GOAL #2   Title  Pt will demo no deviation of upper thoracic  segments , no increased mm tightness at medial border of scapula L in order to progress to deep extensor mm strengthening to support weight of skull and to increase ROM for driving     Time  2    Period  Weeks    Status  Achieved      PT LONG TERM GOAL #3   Title  Pt will demo increased cervical mm endurance from 2 min to 2:30 min in order to minimize pain     Time  8    Period  Weeks    Status  Achieved      PT LONG TERM GOAL #4   Title  Pt will demo IND with modificaitons and proper alignment and technique to yoga poses in order to minimize injuries nor worseing of Sx in community yoga classes    Time  10    Period  Weeks    Status  Achieved      PT LONG TERM GOAL #5  Title  Pt will demo increased AROM to [facility] in extensions, sidebend, rotation B without pain and without cue for scapular retraction/depression prior to cervical movements in order to ambulate safely with head turns and change lanes in car     Time  4    Period  Weeks    Status  Achieved      Additional Long Term Goals   Additional Long Term Goals  Yes      PT LONG TERM GOAL #6   Title  Pt will be IND with HEP customized for scoliosis    Time  8    Period  Weeks    Status  Achieved      PT LONG TERM GOAL #7   Title  Pt will demo improved scapular stabilization neuromuscular control in body weight positions and yoga postures in order to minimize overuse of upper trap and return of neck pain    Time  8    Period  Weeks    Status  New    Target Date  06/24/17      PT LONG TERM GOAL #8   Title  Pt will demo no delay to stepping strategies with pertubations at the waist in order to improve reflexes, postural stability, and to minimize risk for falls    Time  12    Period  Weeks    Status  New    Target Date  07/22/17            Plan - 05/24/17 1801    Clinical Impression Statement  Pt is progressing well with less cue for cervical / scapular retraction with new seated HEP with resistance bands. Added  golfer's lift with isometric strengthening on single UE against wall to improve shoulder strengthening and balance. Pt showed poor carry over with stepping strategies to perturbation training and required cues. Plan to challenge balance with head turns since pt has increased neck ROM. Pt continues to benefit from skilled PT.      Rehab Potential  Good    PT Frequency  1x / week    PT Duration  12 weeks    PT Treatment/Interventions  Gait training;Therapeutic exercise;Therapeutic activities;Neuromuscular re-education;Moist Heat;Manual techniques;Patient/family education;Scar mobilization;Functional mobility training    Consulted and Agree with Plan of Care  Patient       Patient will benefit from skilled therapeutic intervention in order to improve the following deficits and impairments:  Improper body mechanics, Pain, Postural dysfunction, Increased muscle spasms, Decreased coordination, Decreased mobility, Decreased endurance, Decreased range of motion, Decreased safety awareness, Hypomobility, Decreased strength  Visit Diagnosis: Cervicalgia  Myalgia  Other abnormalities of gait and mobility     Problem List There are no active problems to display for this patient.   [name] 05/24/2017, 6:14 PM  [place] [facility] MAIN [facility] SERVICES [address] [place], [place], [address] Phone: [phone]   Fax:  [phone]  Name: Alvin Hudson MRN: [id number] Date of Birth: 05-09-1935

## 2017-05-24 NOTE — Patient Instructions (Addendum)
With illustrations and recorded on patient's phone as videos  Seated:  "w" with elbows at ribs, with yellow band  Open knees apart ~20 deg .  red band at thighs, feet on the floor , hip width apart   30 reps  X 2    _________   New:  Yellow band wrapped around L thigh, hold in R hand, elbow by ribs, pull band out from 0 deg to 30 deg for shoulder strengthening    30 x 2  ________   Alvin Hudson 's lift with one hand against the wall   Feet, hips, knees, squared to the front    15 reps on each leg

## 2017-06-08 ENCOUNTER — Ambulatory Visit: Payer: Medicare Other | Attending: Orthopedic Surgery | Admitting: Physical Therapy

## 2017-06-08 DIAGNOSIS — R2689 Other abnormalities of gait and mobility: Secondary | ICD-10-CM | POA: Insufficient documentation

## 2017-06-08 DIAGNOSIS — M791 Myalgia, unspecified site: Secondary | ICD-10-CM | POA: Insufficient documentation

## 2017-06-08 DIAGNOSIS — M542 Cervicalgia: Secondary | ICD-10-CM | POA: Diagnosis not present

## 2017-06-08 NOTE — Therapy (Signed)
[place] [facility] MAIN [facility] SERVICES [address] [place], [place], [address] Phone: [phone]   Fax:  [phone]  Physical Therapy Treatment / Discharge Note  Patient Details  Name: Alvin Hudson MRN: [id number] Date of Birth: 1935-08-12 Referring Provider: Leonel Ramsay    Encounter Date: 06/08/2017  PT End of Session - 06/08/17 2354    Visit Number  10    Number of Visits  12    Date for PT Re-Evaluation  07/19/17    PT Start Time  1510    PT Stop Time  1545    PT Time Calculation (min)  35 min    Activity Tolerance  Patient tolerated treatment well;No increased pain    Behavior During Therapy  WFL for tasks assessed/performed       Past Medical History:  Diagnosis Date  . Allergy     Past Surgical History:  Procedure Laterality Date  . INGUINAL HERNIA REPAIR Bilateral     There were no vitals filed for this visit.  Subjective Assessment - 06/08/17 1514    Subjective  [place] 10 hour for 2x day without issues. Pt has been lazy with his exercises.    Patient Stated Goals  to exercises to relief the pain.                      Reassessed goals and perturbation training ( Tx)               PT Long Term Goals - 06/08/17 1515      PT LONG TERM GOAL #1   Title  Pt will decrease his NDI score from 16% to < 11% in order to improve QOL  ( 06/08/17  6%)     Time  12    Period  Weeks    Status  Achieved      PT LONG TERM GOAL #2   Title  Pt will demo no deviation of upper thoracic segments , no increased mm tightness at medial border of scapula L in order to progress to deep extensor mm strengthening to support weight of skull and to increase ROM for driving     Time  2    Period  Weeks    Status  Achieved      PT LONG TERM GOAL #3   Title  Pt will demo increased cervical mm endurance from 2 min to 2:30 min in order to minimize pain     Time  8    Period  Weeks    Status  Achieved       PT LONG TERM GOAL #4   Title  Pt will demo IND with modificaitons and proper alignment and technique to yoga poses in order to minimize injuries nor worseing of Sx in community yoga classes    Time  10    Period  Weeks    Status  Achieved      PT LONG TERM GOAL #5   Title  Pt will demo increased AROM to [facility] in extensions, sidebend, rotation B without pain and without cue for scapular retraction/depression prior to cervical movements in order to ambulate safely with head turns and change lanes in car     Time  4    Period  Weeks    Status  Achieved      PT LONG TERM GOAL #6   Title  Pt will be IND with  HEP customized for scoliosis    Time  8    Period  Weeks    Status  Achieved      PT LONG TERM GOAL #7   Title  Pt will demo improved scapular stabilization neuromuscular control in body weight positions and yoga postures in order to minimize overuse of upper trap and return of neck pain    Time  8    Period  Weeks    Status  Achieved      PT LONG TERM GOAL #8   Title  Pt will demo no delay to stepping strategies with pertubations at the waist in order to improve reflexes, postural stability, and to minimize risk for falls    Time  12    Period  Weeks    Status  Achieved            Plan - 06/08/17 2355    Clinical Impression Statement  Pt has achieved 100% of his goals and demonstrates significantly improved posture, increased cervical ROM, gait, and decreased mm neck and shoulder tensions. Pt's scoliosis and leg length have been addressed with manual Tx and customized HEP which decreased mm imbalances and hypomobility in cervicothoracic spine. Pt demonstrated improved scapular control  with increased thoracolumbar/ glut strength.  Pt continues to be compliant with shoe lift wear to accommodate for leg length difference.Today, pt required minor cues for perturbation training to improve balance.Pt reports his neck Sx have imrproved "Quite a Bit Better" since [facility].   Pt is ready  for d/c.    Rehab Potential  Good    PT Frequency  1x / week    PT Duration  12 weeks    PT Treatment/Interventions  Gait training;Therapeutic exercise;Therapeutic activities;Neuromuscular re-education;Moist Heat;Manual techniques;Patient/family education;Scar mobilization;Functional mobility training    Consulted and Agree with Plan of Care  Patient       Patient will benefit from skilled therapeutic intervention in order to improve the following deficits and impairments:  Improper body mechanics, Pain, Postural dysfunction, Increased muscle spasms, Decreased coordination, Decreased mobility, Decreased endurance, Decreased range of motion, Decreased safety awareness, Hypomobility, Decreased strength  Visit Diagnosis: Cervicalgia  Myalgia  Other abnormalities of gait and mobility     Problem List There are no active problems to display for this patient.   [name] ,PT, DPT, E-RYT  06/08/2017, 11:59 PM  [place] [facility] MAIN [facility] SERVICES [address] [place], [place], [address] Phone: [phone]   Fax:  [phone]  Name: Alvin Hudson MRN: [id number] Date of Birth: 1935-09-12

## 2017-06-22 ENCOUNTER — Ambulatory Visit: Payer: Medicare Other | Admitting: Physical Therapy

## 2017-06-27 ENCOUNTER — Encounter: Payer: Medicare Other | Admitting: Physical Therapy

## 2018-03-01 ENCOUNTER — Other Ambulatory Visit: Payer: Self-pay

## 2018-03-01 ENCOUNTER — Emergency Department
Admission: EM | Admit: 2018-03-01 | Discharge: 2018-03-01 | Disposition: A | Discharge status: Home / Self Care | Payer: Medicare Other | Attending: Emergency Medicine | Admitting: Emergency Medicine

## 2018-03-01 ENCOUNTER — Emergency Department: Payer: Medicare Other

## 2018-03-01 DIAGNOSIS — R55 Syncope and collapse: Secondary | ICD-10-CM | POA: Insufficient documentation

## 2018-03-01 DIAGNOSIS — Z79899 Other long term (current) drug therapy: Secondary | ICD-10-CM | POA: Diagnosis not present

## 2018-03-01 DIAGNOSIS — Z7982 Long term (current) use of aspirin: Secondary | ICD-10-CM | POA: Insufficient documentation

## 2018-03-01 HISTORY — DX: Unspecified convulsions: R56.9

## 2018-03-01 LAB — URINALYSIS, COMPLETE (UACMP) WITH MICROSCOPIC
Bacteria, UA: NONE SEEN
Bilirubin Urine: NEGATIVE
Glucose, UA: NEGATIVE mg/dL
Ketones, ur: NEGATIVE mg/dL
Leukocytes, UA: NEGATIVE
Nitrite: NEGATIVE
Protein, ur: NEGATIVE mg/dL
Specific Gravity, Urine: 1.014 (ref 1.005–1.030)
Squamous Epithelial / HPF: NONE SEEN (ref 0–5)
pH: 5 (ref 5.0–8.0)

## 2018-03-01 LAB — CBC
HCT: 42.3 % (ref 39.0–52.0)
Hemoglobin: 14 g/dL (ref 13.0–17.0)
MCH: 35.8 pg — ABNORMAL HIGH (ref 26.0–34.0)
MCHC: 33.1 g/dL (ref 30.0–36.0)
MCV: 108.2 fL — ABNORMAL HIGH (ref 80.0–100.0)
Platelets: 170 10*3/uL (ref 150–400)
RBC: 3.91 MIL/uL — ABNORMAL LOW (ref 4.22–5.81)
RDW: 13.1 % (ref 11.5–15.5)
WBC: 7.6 10*3/uL (ref 4.0–10.5)
nRBC: 0 % (ref 0.0–0.2)

## 2018-03-01 LAB — BASIC METABOLIC PANEL
Anion gap: 4 — ABNORMAL LOW (ref 5–15)
BUN: 16 mg/dL (ref 8–23)
CO2: 30 mmol/L (ref 22–32)
CREATININE: 0.83 mg/dL (ref 0.61–1.24)
Calcium: 9.3 mg/dL (ref 8.9–10.3)
Chloride: 105 mmol/L (ref 98–111)
GFR calc Af Amer: >60 mL/min (ref >60)
GFR calc non Af Amer: >60 mL/min (ref >60)
Glucose, Bld: 109 mg/dL — ABNORMAL HIGH (ref 70–99)
Potassium: 3.9 mmol/L (ref 3.5–5.1)
Sodium: 139 mmol/L (ref 135–145)

## 2018-03-01 LAB — TROPONIN I
Troponin I: <0.03 ng/mL (ref <0.03)
Troponin I: <0.03 ng/mL (ref <0.03)

## 2018-03-01 LAB — GLUCOSE, CAPILLARY: Glucose-Capillary: 154 mg/dL — ABNORMAL HIGH (ref 70–99)

## 2018-03-01 MED ORDER — SODIUM CHLORIDE 0.9% FLUSH
3.0000 mL | Freq: Once | INTRAVENOUS | Status: AC
  Administered 2018-03-01: 3 mL via INTRAVENOUS

## 2018-03-01 NOTE — ED Provider Notes (Signed)
[facility] Emergency Department Provider Note   ____________________________________________   I have reviewed the triage vital signs and the nursing notes.   HISTORY  Chief Complaint Syncope  History limited by: Not Limited   HPI Alvin Hudson is a 83 y.o. male who presents to the emergency department today after a syncopal episode.  The patient had just driven to the grocery store and got out of the car.  He then felt dizzy and passed out.  Did hit his head on the way down to the ground.  When he came to he noticed a headache located in bilateral temples.  Is now primarily located in the left temple.  Patient denies any chest pain or palpitations prior to the syncopal episode.  Denies any headache prior to the syncopal episode.  Had been having a normal morning form.  Ate and drink his normal amount.  Denies previous syncopal episodes. No seizure like activity or incontinence.    Per medical record review patient has a history of seizures.  Past Medical History:  Diagnosis Date  . Allergy   . Seizures (HCC)     There are no active problems to display for this patient.   Past Surgical History:  Procedure Laterality Date  . INGUINAL HERNIA REPAIR Bilateral     Prior to Admission medications   Medication Sig Start Date End Date Taking? Authorizing Provider  aspirin EC 81 MG tablet Take 81 mg by mouth daily.    [provider]  atorvastatin (LIPITOR) 20 MG tablet Take 20 mg by mouth daily.    [provider]  b complex vitamins tablet Take 1 tablet by mouth daily.    [provider]  levETIRAcetam (KEPPRA) 750 MG tablet Take 750 mg by mouth 2 (two) times daily.    [provider]  sertraline (ZOLOFT) 100 MG tablet Take 100 mg by mouth daily.    [provider]    Allergies Aspirin and Naproxen  No family history on file.  Social History Social History   Tobacco Use  . Smoking status: Never Smoker   . Smokeless tobacco: Never Used  Substance Use Topics  . Alcohol use: Yes    Alcohol/week: 14.0 standard drinks    Types: 14 Standard drinks or equivalent per week  . Drug use: No    Review of Systems Constitutional: No fever/chills Eyes: No visual changes. ENT: No sore throat. Cardiovascular: Denies chest pain. Respiratory: Denies shortness of breath. Gastrointestinal: No abdominal pain.  No nausea, no vomiting.  No diarrhea.   Genitourinary: Negative for dysuria. Musculoskeletal: Negative for back pain. Skin: Negative for rash. Neurological: Positive for headache.   ____________________________________________   PHYSICAL EXAM:  VITAL SIGNS: ED Triage Vitals  Enc Vitals Group     BP 03/01/18 1530 (!) 153/89     Pulse Rate 03/01/18 1530 67     Resp 03/01/18 1530 18     Temp 03/01/18 1530 97.9 F (36.6 C)     Temp Source 03/01/18 1530 Oral     SpO2 03/01/18 1530 100 %     Weight 03/01/18 1527 155 lb (70.3 kg)     Height 03/01/18 1527 5\' 11"  (1.803 m)     Head Circumference --      Peak Flow --      Pain Score 03/01/18 1526 10    Constitutional: Alert and oriented.  Eyes: Conjunctivae are normal.  ENT      Head: Normocephalic and atraumatic.  Nose: No congestion/rhinnorhea.      Mouth/Throat: Mucous membranes are moist.      Neck: No stridor. Hematological/Lymphatic/Immunilogical: No cervical lymphadenopathy. Cardiovascular: Normal rate, regular rhythm.  Systolic murmur.  Respiratory: Normal respiratory effort without tachypnea nor retractions. Breath sounds are clear and equal bilaterally. No wheezes/rales/rhonchi. Gastrointestinal: Soft and non tender. No rebound. No guarding.  Genitourinary: Deferred Musculoskeletal: Normal range of motion in all extremities. No lower extremity edema. Neurologic:  Normal speech and language. No gross focal neurologic deficits are appreciated.  Skin:  Skin is warm, dry and intact. No rash noted. Psychiatric: Mood and  affect are normal. Speech and behavior are normal. Patient exhibits appropriate insight and judgment.  ____________________________________________    LABS (pertinent positives/negatives)  BMP wnl except glu 109, anion gap 4 CBC wbc 7.6, hgb 14.0, plt 170  ____________________________________________   EKG  I, Phineas Semen, attending physician, personally viewed and interpreted this EKG  EKG Time: 1526 Rate: 66 Rhythm: sinus rhythm with 1st degree av block Axis: left axis deviation Intervals: qtc 471 QRS: nonspecific intraventricular block ST changes: repol in v1-v4, st depression v6 Impression: abnormal ekg  I, Phineas Semen, attending physician, personally viewed and interpreted this EKG  EKG Time: 1949 Rate: 60 Rhythm: sinus rhythm with 1st degree av block Axis: left axis deviation Intervals: qtc 470 QRS: nonspecific intraventricular block ST changes: repol in v1-v4, st depression v6 Impression: abnormal ekg   ____________________________________________    RADIOLOGY  CT head/cervical spine No acute intracranial process/ no fracture  ____________________________________________   PROCEDURES  Procedures  ____________________________________________   INITIAL IMPRESSION / ASSESSMENT AND PLAN / ED COURSE  Pertinent labs & imaging results that were available during my care of the patient were reviewed by me and considered in my medical decision making (see chart for details).   Patient presented to the emergency department today after a syncopal episode.  Differential would include arrhythmia, ACS, vasovagal, intracranial bleed, anemia, electrolyte abnormality amongst other etiologies.  On exam patient appears well.  Blood work with negative troponin x2.  Repeat EKG continues to show IVCD.  Per care everywhere written description shows he has had ventricular block in the past.  Patient continued to feel well here.  Head CT without any concerning bleed.   Will plan on discharge to follow-up with primary care.   ____________________________________________   FINAL CLINICAL IMPRESSION(S) / ED DIAGNOSES  Final diagnoses:  Syncope, unspecified syncope type     Note: This dictation was prepared with Dragon dictation. Any transcriptional errors that result from this process are unintentional     Phineas Semen, MD 03/01/18 1958

## 2018-03-01 NOTE — ED Notes (Signed)
Patient awaiting ct head. Presently denies any cp/sob or concerns. Reports he fell back wards going to hi car today. Denies LOC, denies hitting head. S/o at bedside.

## 2018-03-01 NOTE — ED Notes (Signed)
Full rainbow sent to lab.  

## 2018-03-01 NOTE — ED Notes (Signed)
RN Para March updated on pt's complaint. RN at bedside at this time.

## 2018-03-01 NOTE — Discharge Instructions (Addendum)
Please seek medical attention for any high fevers, chest pain, shortness of breath, change in behavior, persistent vomiting, bloody stool or any other new or concerning symptoms.  

## 2018-03-01 NOTE — ED Triage Notes (Signed)
Pt comes via POV from home and was about to get into his car and just passed out. Pt states he believes he went unresponsive.  Pt states terrible headache. Pt denies any blurred vision, chest pain and SOB.  Pt hit the back head. Pt not on blood thinners. Pt has hx of seizures per wife.  Last seizure couple of years ago. Wife also reports pt was having chills yesterday.

## 2018-03-01 NOTE — ED Notes (Signed)
Patient back from ct head.

## 2018-03-01 NOTE — ED Notes (Signed)
Blood glucose 154

## 2018-03-10 ENCOUNTER — Emergency Department (HOSPITAL_COMMUNITY): Payer: Medicare Other | Admitting: Certified Registered"

## 2018-03-10 ENCOUNTER — Encounter (HOSPITAL_COMMUNITY): Payer: Self-pay

## 2018-03-10 ENCOUNTER — Inpatient Hospital Stay (HOSPITAL_COMMUNITY): Payer: Medicare Other

## 2018-03-10 ENCOUNTER — Emergency Department: Payer: Medicare Other

## 2018-03-10 ENCOUNTER — Emergency Department
Admission: EM | Admit: 2018-03-10 | Discharge: 2018-03-10 | Disposition: A | Discharge status: Other Acute Inpatient Hospital | Payer: Medicare Other | Attending: Emergency Medicine | Admitting: Emergency Medicine

## 2018-03-10 ENCOUNTER — Encounter (HOSPITAL_COMMUNITY)
Admission: EM | Disposition: A | Discharge status: Rehabilitation Facility | Payer: Self-pay | Source: Home / Self Care | Attending: Neurosurgery

## 2018-03-10 ENCOUNTER — Inpatient Hospital Stay (HOSPITAL_COMMUNITY)
Admission: EM | Admit: 2018-03-10 | Discharge: 2018-03-12 | DRG: 026 | Disposition: A | Discharge status: Rehabilitation Facility | Payer: Medicare Other | Attending: Neurosurgery | Admitting: Neurosurgery

## 2018-03-10 ENCOUNTER — Inpatient Hospital Stay
Admission: RE | Admit: 2018-03-10 | Payer: Medicare Other | Source: Other Acute Inpatient Hospital | Admitting: Neurosurgery

## 2018-03-10 DIAGNOSIS — S065XAA Traumatic subdural hemorrhage with loss of consciousness status unknown, initial encounter: Secondary | ICD-10-CM

## 2018-03-10 DIAGNOSIS — I6602 Occlusion and stenosis of left middle cerebral artery: Secondary | ICD-10-CM | POA: Diagnosis present

## 2018-03-10 DIAGNOSIS — S065X9A Traumatic subdural hemorrhage with loss of consciousness of unspecified duration, initial encounter: Secondary | ICD-10-CM | POA: Diagnosis present

## 2018-03-10 DIAGNOSIS — S065X1S Traumatic subdural hemorrhage with loss of consciousness of 30 minutes or less, sequela: Secondary | ICD-10-CM | POA: Diagnosis not present

## 2018-03-10 DIAGNOSIS — I248 Other forms of acute ischemic heart disease: Secondary | ICD-10-CM | POA: Diagnosis present

## 2018-03-10 DIAGNOSIS — S065X0S Traumatic subdural hemorrhage without loss of consciousness, sequela: Secondary | ICD-10-CM | POA: Diagnosis not present

## 2018-03-10 DIAGNOSIS — I351 Nonrheumatic aortic (valve) insufficiency: Secondary | ICD-10-CM | POA: Diagnosis not present

## 2018-03-10 DIAGNOSIS — R479 Unspecified speech disturbances: Secondary | ICD-10-CM | POA: Diagnosis present

## 2018-03-10 DIAGNOSIS — S065X2S Traumatic subdural hemorrhage with loss of consciousness of 31 minutes to 59 minutes, sequela: Secondary | ICD-10-CM | POA: Diagnosis not present

## 2018-03-10 DIAGNOSIS — Z8679 Personal history of other diseases of the circulatory system: Secondary | ICD-10-CM | POA: Diagnosis not present

## 2018-03-10 DIAGNOSIS — G40909 Epilepsy, unspecified, not intractable, without status epilepticus: Secondary | ICD-10-CM | POA: Diagnosis present

## 2018-03-10 DIAGNOSIS — I4811 Longstanding persistent atrial fibrillation: Secondary | ICD-10-CM | POA: Diagnosis not present

## 2018-03-10 DIAGNOSIS — D531 Other megaloblastic anemias, not elsewhere classified: Secondary | ICD-10-CM | POA: Diagnosis present

## 2018-03-10 DIAGNOSIS — I6200 Nontraumatic subdural hemorrhage, unspecified: Secondary | ICD-10-CM | POA: Insufficient documentation

## 2018-03-10 DIAGNOSIS — E871 Hypo-osmolality and hyponatremia: Secondary | ICD-10-CM | POA: Diagnosis not present

## 2018-03-10 DIAGNOSIS — I480 Paroxysmal atrial fibrillation: Secondary | ICD-10-CM | POA: Diagnosis not present

## 2018-03-10 DIAGNOSIS — Q248 Other specified congenital malformations of heart: Secondary | ICD-10-CM | POA: Diagnosis not present

## 2018-03-10 DIAGNOSIS — R4781 Slurred speech: Secondary | ICD-10-CM | POA: Diagnosis present

## 2018-03-10 DIAGNOSIS — G8384 Todd's paralysis (postepileptic): Secondary | ICD-10-CM | POA: Diagnosis present

## 2018-03-10 DIAGNOSIS — I340 Nonrheumatic mitral (valve) insufficiency: Secondary | ICD-10-CM | POA: Diagnosis not present

## 2018-03-10 DIAGNOSIS — R40242 Glasgow coma scale score 9-12, unspecified time: Secondary | ICD-10-CM | POA: Diagnosis present

## 2018-03-10 DIAGNOSIS — D638 Anemia in other chronic diseases classified elsewhere: Secondary | ICD-10-CM | POA: Diagnosis not present

## 2018-03-10 DIAGNOSIS — I679 Cerebrovascular disease, unspecified: Secondary | ICD-10-CM | POA: Diagnosis not present

## 2018-03-10 DIAGNOSIS — R569 Unspecified convulsions: Secondary | ICD-10-CM | POA: Diagnosis not present

## 2018-03-10 DIAGNOSIS — R4701 Aphasia: Secondary | ICD-10-CM | POA: Diagnosis present

## 2018-03-10 DIAGNOSIS — G8191 Hemiplegia, unspecified affecting right dominant side: Secondary | ICD-10-CM | POA: Diagnosis present

## 2018-03-10 DIAGNOSIS — D539 Nutritional anemia, unspecified: Secondary | ICD-10-CM | POA: Diagnosis present

## 2018-03-10 DIAGNOSIS — R410 Disorientation, unspecified: Secondary | ICD-10-CM | POA: Diagnosis not present

## 2018-03-10 DIAGNOSIS — J9811 Atelectasis: Secondary | ICD-10-CM | POA: Diagnosis present

## 2018-03-10 DIAGNOSIS — R1312 Dysphagia, oropharyngeal phase: Secondary | ICD-10-CM | POA: Diagnosis present

## 2018-03-10 DIAGNOSIS — Z79899 Other long term (current) drug therapy: Secondary | ICD-10-CM | POA: Diagnosis not present

## 2018-03-10 DIAGNOSIS — I6201 Nontraumatic acute subdural hemorrhage: Principal | ICD-10-CM | POA: Diagnosis present

## 2018-03-10 DIAGNOSIS — I4891 Unspecified atrial fibrillation: Secondary | ICD-10-CM | POA: Diagnosis present

## 2018-03-10 DIAGNOSIS — S065X9S Traumatic subdural hemorrhage with loss of consciousness of unspecified duration, sequela: Secondary | ICD-10-CM | POA: Diagnosis not present

## 2018-03-10 DIAGNOSIS — R7989 Other specified abnormal findings of blood chemistry: Secondary | ICD-10-CM | POA: Diagnosis not present

## 2018-03-10 DIAGNOSIS — R4702 Dysphasia: Secondary | ICD-10-CM | POA: Diagnosis present

## 2018-03-10 DIAGNOSIS — J811 Chronic pulmonary edema: Secondary | ICD-10-CM | POA: Diagnosis present

## 2018-03-10 DIAGNOSIS — I421 Obstructive hypertrophic cardiomyopathy: Secondary | ICD-10-CM | POA: Diagnosis present

## 2018-03-10 DIAGNOSIS — Z886 Allergy status to analgesic agent status: Secondary | ICD-10-CM

## 2018-03-10 DIAGNOSIS — R509 Fever, unspecified: Secondary | ICD-10-CM | POA: Diagnosis not present

## 2018-03-10 DIAGNOSIS — I10 Essential (primary) hypertension: Secondary | ICD-10-CM | POA: Diagnosis present

## 2018-03-10 DIAGNOSIS — I4819 Other persistent atrial fibrillation: Secondary | ICD-10-CM | POA: Diagnosis not present

## 2018-03-10 DIAGNOSIS — K5900 Constipation, unspecified: Secondary | ICD-10-CM | POA: Diagnosis not present

## 2018-03-10 DIAGNOSIS — Z681 Body mass index (BMI) 19 or less, adult: Secondary | ICD-10-CM | POA: Diagnosis not present

## 2018-03-10 DIAGNOSIS — S065X9D Traumatic subdural hemorrhage with loss of consciousness of unspecified duration, subsequent encounter: Secondary | ICD-10-CM | POA: Diagnosis present

## 2018-03-10 DIAGNOSIS — E46 Unspecified protein-calorie malnutrition: Secondary | ICD-10-CM | POA: Diagnosis present

## 2018-03-10 DIAGNOSIS — S065X0D Traumatic subdural hemorrhage without loss of consciousness, subsequent encounter: Secondary | ICD-10-CM | POA: Diagnosis not present

## 2018-03-10 DIAGNOSIS — I5032 Chronic diastolic (congestive) heart failure: Secondary | ICD-10-CM | POA: Diagnosis not present

## 2018-03-10 DIAGNOSIS — J90 Pleural effusion, not elsewhere classified: Secondary | ICD-10-CM | POA: Diagnosis present

## 2018-03-10 DIAGNOSIS — R1310 Dysphagia, unspecified: Secondary | ICD-10-CM | POA: Diagnosis not present

## 2018-03-10 DIAGNOSIS — R55 Syncope and collapse: Secondary | ICD-10-CM | POA: Diagnosis not present

## 2018-03-10 DIAGNOSIS — I447 Left bundle-branch block, unspecified: Secondary | ICD-10-CM | POA: Diagnosis present

## 2018-03-10 DIAGNOSIS — I609 Nontraumatic subarachnoid hemorrhage, unspecified: Secondary | ICD-10-CM | POA: Diagnosis not present

## 2018-03-10 DIAGNOSIS — W19XXXD Unspecified fall, subsequent encounter: Secondary | ICD-10-CM | POA: Diagnosis not present

## 2018-03-10 DIAGNOSIS — D72829 Elevated white blood cell count, unspecified: Secondary | ICD-10-CM | POA: Diagnosis present

## 2018-03-10 HISTORY — PX: CRANIOTOMY: SHX93

## 2018-03-10 LAB — BASIC METABOLIC PANEL
Anion gap: 6 (ref 5–15)
BUN: 10 mg/dL (ref 8–23)
CO2: 29 mmol/L (ref 22–32)
Calcium: 9.1 mg/dL (ref 8.9–10.3)
Chloride: 99 mmol/L (ref 98–111)
Creatinine, Ser: 0.66 mg/dL (ref 0.61–1.24)
GFR calc Af Amer: >60 mL/min (ref >60)
GFR calc non Af Amer: >60 mL/min (ref >60)
Glucose, Bld: 113 mg/dL — ABNORMAL HIGH (ref 70–99)
Potassium: 3.5 mmol/L (ref 3.5–5.1)
Sodium: 134 mmol/L — ABNORMAL LOW (ref 135–145)

## 2018-03-10 LAB — TYPE AND SCREEN
ABO/RH(D): O POS
Antibody Screen: NEGATIVE

## 2018-03-10 LAB — CBC
HCT: 39.9 % (ref 39.0–52.0)
Hemoglobin: 13.3 g/dL (ref 13.0–17.0)
MCH: 35.6 pg — ABNORMAL HIGH (ref 26.0–34.0)
MCHC: 33.3 g/dL (ref 30.0–36.0)
MCV: 106.7 fL — ABNORMAL HIGH (ref 80.0–100.0)
Platelets: 218 10*3/uL (ref 150–400)
RBC: 3.74 MIL/uL — ABNORMAL LOW (ref 4.22–5.81)
RDW: 13.1 % (ref 11.5–15.5)
WBC: 9.3 10*3/uL (ref 4.0–10.5)
nRBC: 0 % (ref 0.0–0.2)

## 2018-03-10 LAB — TROPONIN I: Troponin I: 0.04 ng/mL (ref <0.03)

## 2018-03-10 LAB — ABO/RH: ABO/RH(D): O POS

## 2018-03-10 LAB — GLUCOSE, CAPILLARY: Glucose-Capillary: 95 mg/dL (ref 70–99)

## 2018-03-10 SURGERY — CRANIOTOMY HEMATOMA EVACUATION SUBDURAL
Anesthesia: General | Laterality: Left

## 2018-03-10 MED ORDER — HYDRALAZINE HCL 20 MG/ML IJ SOLN
INTRAMUSCULAR | Status: AC
  Filled 2018-03-10: qty 1

## 2018-03-10 MED ORDER — OXYCODONE HCL 5 MG/5ML PO SOLN: 5.0000 mg | Freq: Once | ORAL | Status: DC | PRN

## 2018-03-10 MED ORDER — EPHEDRINE SULFATE-NACL 50-0.9 MG/10ML-% IV SOSY
PREFILLED_SYRINGE | INTRAVENOUS | Status: DC | PRN
  Administered 2018-03-10 (×2): 10 mg via INTRAVENOUS

## 2018-03-10 MED ORDER — HYDRALAZINE HCL 20 MG/ML IJ SOLN
INTRAMUSCULAR | Status: DC | PRN
  Administered 2018-03-10 (×2): 5 mg via INTRAVENOUS

## 2018-03-10 MED ORDER — ESMOLOL HCL 100 MG/10ML IV SOLN
INTRAVENOUS | Status: DC | PRN
  Administered 2018-03-10 (×2): 10 mg via INTRAVENOUS
  Administered 2018-03-10: 20 mg via INTRAVENOUS
  Administered 2018-03-10: 50 mg via INTRAVENOUS

## 2018-03-10 MED ORDER — THROMBIN 5000 UNITS EX SOLR
OROMUCOSAL | Status: DC | PRN
  Administered 2018-03-10: 17:00:00 via TOPICAL

## 2018-03-10 MED ORDER — FENTANYL CITRATE (PF) 100 MCG/2ML IJ SOLN
INTRAMUSCULAR | Status: DC | PRN
  Administered 2018-03-10: 50 ug via INTRAVENOUS
  Administered 2018-03-10: 25 ug via INTRAVENOUS
  Administered 2018-03-10: 100 ug via INTRAVENOUS
  Administered 2018-03-10: 25 ug via INTRAVENOUS
  Administered 2018-03-10: 50 ug via INTRAVENOUS
  Administered 2018-03-10: 25 ug via INTRAVENOUS

## 2018-03-10 MED ORDER — ACETAMINOPHEN 650 MG RE SUPP: 650.0000 mg | RECTAL | Status: DC | PRN

## 2018-03-10 MED ORDER — PHENYLEPHRINE 40 MCG/ML (10ML) SYRINGE FOR IV PUSH (FOR BLOOD PRESSURE SUPPORT)
PREFILLED_SYRINGE | INTRAVENOUS | Status: AC
  Filled 2018-03-10: qty 10

## 2018-03-10 MED ORDER — LIDOCAINE-EPINEPHRINE 1 %-1:100000 IJ SOLN
INTRAMUSCULAR | Status: AC
  Filled 2018-03-10: qty 1

## 2018-03-10 MED ORDER — DEXAMETHASONE SODIUM PHOSPHATE 10 MG/ML IJ SOLN
INTRAMUSCULAR | Status: DC | PRN
  Administered 2018-03-10: 10 mg via INTRAVENOUS

## 2018-03-10 MED ORDER — FENTANYL CITRATE (PF) 250 MCG/5ML IJ SOLN
INTRAMUSCULAR | Status: AC
  Filled 2018-03-10: qty 5

## 2018-03-10 MED ORDER — BACITRACIN ZINC 500 UNIT/GM EX OINT
TOPICAL_OINTMENT | CUTANEOUS | Status: DC | PRN
  Administered 2018-03-10 (×2): 1 via TOPICAL

## 2018-03-10 MED ORDER — ONDANSETRON HCL 4 MG/2ML IJ SOLN: 4.0000 mg | INTRAMUSCULAR | Status: DC | PRN

## 2018-03-10 MED ORDER — CEFAZOLIN SODIUM-DEXTROSE 2-4 GM/100ML-% IV SOLN
INTRAVENOUS | Status: AC
  Filled 2018-03-10: qty 100

## 2018-03-10 MED ORDER — SUCCINYLCHOLINE CHLORIDE 200 MG/10ML IV SOSY
PREFILLED_SYRINGE | INTRAVENOUS | Status: AC
  Filled 2018-03-10: qty 10

## 2018-03-10 MED ORDER — DEXMEDETOMIDINE HCL 200 MCG/2ML IV SOLN
INTRAVENOUS | Status: DC | PRN
  Administered 2018-03-10: 8 ug via INTRAVENOUS
  Administered 2018-03-10: 32 ug via INTRAVENOUS

## 2018-03-10 MED ORDER — HYDRALAZINE HCL 20 MG/ML IJ SOLN
10.0000 mg | INTRAMUSCULAR | Status: DC | PRN
  Administered 2018-03-10: 10 mg via INTRAVENOUS
  Filled 2018-03-10: qty 1

## 2018-03-10 MED ORDER — THROMBIN 5000 UNITS EX SOLR
CUTANEOUS | Status: AC
  Filled 2018-03-10: qty 5000

## 2018-03-10 MED ORDER — LEVETIRACETAM 750 MG PO TABS
750.0000 mg | ORAL_TABLET | Freq: Two times a day (BID) | ORAL | Status: DC
  Administered 2018-03-10 – 2018-03-12 (×4): 750 mg via ORAL
  Filled 2018-03-10 (×4): qty 1

## 2018-03-10 MED ORDER — PROMETHAZINE HCL 25 MG PO TABS: 12.5000 mg | ORAL_TABLET | ORAL | Status: DC | PRN

## 2018-03-10 MED ORDER — PROPOFOL 10 MG/ML IV BOLUS
INTRAVENOUS | Status: DC | PRN
  Administered 2018-03-10: 50 mg via INTRAVENOUS
  Administered 2018-03-10: 180 mg via INTRAVENOUS

## 2018-03-10 MED ORDER — BUPIVACAINE-EPINEPHRINE 0.5% -1:200000 IJ SOLN
INTRAMUSCULAR | Status: DC | PRN
  Administered 2018-03-10: 10 mL

## 2018-03-10 MED ORDER — PANTOPRAZOLE SODIUM 40 MG IV SOLR
40.0000 mg | Freq: Every day | INTRAVENOUS | Status: DC
  Administered 2018-03-10: 40 mg via INTRAVENOUS
  Filled 2018-03-10: qty 40

## 2018-03-10 MED ORDER — ONDANSETRON HCL 4 MG PO TABS: 4.0000 mg | ORAL_TABLET | ORAL | Status: DC | PRN

## 2018-03-10 MED ORDER — SUGAMMADEX SODIUM 200 MG/2ML IV SOLN
INTRAVENOUS | Status: DC | PRN
  Administered 2018-03-10: 200 mg via INTRAVENOUS

## 2018-03-10 MED ORDER — ESMOLOL HCL 100 MG/10ML IV SOLN
INTRAVENOUS | Status: AC
  Filled 2018-03-10: qty 10

## 2018-03-10 MED ORDER — ATORVASTATIN CALCIUM 10 MG PO TABS
20.0000 mg | ORAL_TABLET | Freq: Every day | ORAL | Status: DC
  Administered 2018-03-10 – 2018-03-12 (×3): 20 mg via ORAL
  Filled 2018-03-10 (×3): qty 2

## 2018-03-10 MED ORDER — OXYCODONE HCL 5 MG PO TABS: 5.0000 mg | ORAL_TABLET | Freq: Once | ORAL | Status: DC | PRN

## 2018-03-10 MED ORDER — SODIUM CHLORIDE 0.9 % IV SOLN
INTRAVENOUS | Status: DC
  Administered 2018-03-10 (×2): via INTRAVENOUS

## 2018-03-10 MED ORDER — SODIUM CHLORIDE 0.9 % IV SOLN
INTRAVENOUS | Status: DC | PRN
  Administered 2018-03-10: 25 ug/min via INTRAVENOUS

## 2018-03-10 MED ORDER — ONDANSETRON HCL 4 MG/2ML IJ SOLN: 4.0000 mg | Freq: Four times a day (QID) | INTRAMUSCULAR | Status: DC | PRN

## 2018-03-10 MED ORDER — LIDOCAINE 2% (20 MG/ML) 5 ML SYRINGE
INTRAMUSCULAR | Status: DC | PRN
  Administered 2018-03-10: 80 mg via INTRAVENOUS

## 2018-03-10 MED ORDER — THROMBIN 20000 UNITS EX SOLR
CUTANEOUS | Status: DC | PRN
  Administered 2018-03-10: 18:00:00 via TOPICAL

## 2018-03-10 MED ORDER — FENTANYL CITRATE (PF) 100 MCG/2ML IJ SOLN: 25.0000 ug | INTRAMUSCULAR | Status: DC | PRN

## 2018-03-10 MED ORDER — ONDANSETRON HCL 4 MG/2ML IJ SOLN
INTRAMUSCULAR | Status: AC
  Filled 2018-03-10: qty 2

## 2018-03-10 MED ORDER — DEXMEDETOMIDINE HCL IN NACL 200 MCG/50ML IV SOLN
INTRAVENOUS | Status: AC
  Filled 2018-03-10: qty 50

## 2018-03-10 MED ORDER — HYDROCODONE-ACETAMINOPHEN 5-325 MG PO TABS
1.0000 | ORAL_TABLET | ORAL | Status: DC | PRN
  Administered 2018-03-11 – 2018-03-12 (×6): 1 via ORAL
  Filled 2018-03-10 (×6): qty 1

## 2018-03-10 MED ORDER — ROCURONIUM BROMIDE 50 MG/5ML IV SOSY
PREFILLED_SYRINGE | INTRAVENOUS | Status: DC | PRN
  Administered 2018-03-10: 50 mg via INTRAVENOUS
  Administered 2018-03-10 (×2): 10 mg via INTRAVENOUS
  Administered 2018-03-10: 30 mg via INTRAVENOUS

## 2018-03-10 MED ORDER — DEXAMETHASONE SODIUM PHOSPHATE 10 MG/ML IJ SOLN
INTRAMUSCULAR | Status: AC
  Filled 2018-03-10: qty 1

## 2018-03-10 MED ORDER — MORPHINE SULFATE (PF) 2 MG/ML IV SOLN
1.0000 mg | INTRAVENOUS | Status: DC | PRN
  Administered 2018-03-10: 1 mg via INTRAVENOUS
  Administered 2018-03-10 – 2018-03-11 (×2): 2 mg via INTRAVENOUS
  Administered 2018-03-11: 1 mg via INTRAVENOUS
  Administered 2018-03-11: 2 mg via INTRAVENOUS
  Administered 2018-03-11: 1 mg via INTRAVENOUS
  Administered 2018-03-11 – 2018-03-12 (×5): 2 mg via INTRAVENOUS
  Filled 2018-03-10 (×10): qty 1

## 2018-03-10 MED ORDER — CEFAZOLIN SODIUM-DEXTROSE 2-4 GM/100ML-% IV SOLN
2.0000 g | Freq: Three times a day (TID) | INTRAVENOUS | Status: AC
  Administered 2018-03-11 (×2): 2 g via INTRAVENOUS
  Filled 2018-03-10 (×2): qty 100

## 2018-03-10 MED ORDER — BACITRACIN ZINC 500 UNIT/GM EX OINT
TOPICAL_OINTMENT | CUTANEOUS | Status: AC
  Filled 2018-03-10: qty 28.35

## 2018-03-10 MED ORDER — SODIUM CHLORIDE 0.9 % IV SOLN
INTRAVENOUS | Status: DC | PRN
  Administered 2018-03-10: 18:00:00 via INTRAVENOUS

## 2018-03-10 MED ORDER — LEVETIRACETAM IN NACL 500 MG/100ML IV SOLN
500.0000 mg | Freq: Once | INTRAVENOUS | Status: AC
  Administered 2018-03-10: 500 mg via INTRAVENOUS
  Filled 2018-03-10 (×2): qty 100

## 2018-03-10 MED ORDER — CEFAZOLIN SODIUM-DEXTROSE 2-3 GM-%(50ML) IV SOLR
INTRAVENOUS | Status: DC | PRN
  Administered 2018-03-10: 2 g via INTRAVENOUS

## 2018-03-10 MED ORDER — DOCUSATE SODIUM 100 MG PO CAPS
100.0000 mg | ORAL_CAPSULE | Freq: Two times a day (BID) | ORAL | Status: DC
  Administered 2018-03-10 – 2018-03-12 (×4): 100 mg via ORAL
  Filled 2018-03-10 (×4): qty 1

## 2018-03-10 MED ORDER — ACETAMINOPHEN 325 MG PO TABS: 650.0000 mg | ORAL_TABLET | ORAL | Status: DC | PRN

## 2018-03-10 MED ORDER — LABETALOL HCL 5 MG/ML IV SOLN
10.0000 mg | INTRAVENOUS | Status: DC | PRN
  Administered 2018-03-10: 10 mg via INTRAVENOUS
  Filled 2018-03-10: qty 4

## 2018-03-10 MED ORDER — THROMBIN 20000 UNITS EX SOLR
CUTANEOUS | Status: AC
  Filled 2018-03-10: qty 20000

## 2018-03-10 MED ORDER — 0.9 % SODIUM CHLORIDE (POUR BTL) OPTIME
TOPICAL | Status: DC | PRN
  Administered 2018-03-10 (×2): 1000 mL

## 2018-03-10 MED ORDER — ROCURONIUM BROMIDE 50 MG/5ML IV SOSY
PREFILLED_SYRINGE | INTRAVENOUS | Status: AC
  Filled 2018-03-10: qty 5

## 2018-03-10 MED ORDER — ONDANSETRON HCL 4 MG/2ML IJ SOLN
INTRAMUSCULAR | Status: DC | PRN
  Administered 2018-03-10: 4 mg via INTRAVENOUS

## 2018-03-10 MED ORDER — LIDOCAINE 2% (20 MG/ML) 5 ML SYRINGE
INTRAMUSCULAR | Status: AC
  Filled 2018-03-10: qty 5

## 2018-03-10 MED ORDER — LABETALOL HCL 5 MG/ML IV SOLN
INTRAVENOUS | Status: DC | PRN
  Administered 2018-03-10 (×4): 2.5 mg via INTRAVENOUS

## 2018-03-10 MED ORDER — SERTRALINE HCL 50 MG PO TABS
100.0000 mg | ORAL_TABLET | Freq: Every day | ORAL | Status: DC
  Administered 2018-03-10 – 2018-03-12 (×3): 100 mg via ORAL
  Filled 2018-03-10 (×3): qty 2

## 2018-03-10 MED ORDER — BUPIVACAINE HCL (PF) 0.5 % IJ SOLN
INTRAMUSCULAR | Status: AC
  Filled 2018-03-10: qty 30

## 2018-03-10 MED ORDER — PROPOFOL 10 MG/ML IV BOLUS
INTRAVENOUS | Status: AC
  Filled 2018-03-10: qty 20

## 2018-03-10 MED ORDER — SODIUM CHLORIDE 0.9 % IV SOLN
INTRAVENOUS | Status: DC | PRN
  Administered 2018-03-10: 17:00:00

## 2018-03-10 MED ORDER — PHENYLEPHRINE 40 MCG/ML (10ML) SYRINGE FOR IV PUSH (FOR BLOOD PRESSURE SUPPORT)
PREFILLED_SYRINGE | INTRAVENOUS | Status: DC | PRN
  Administered 2018-03-10: 80 ug via INTRAVENOUS
  Administered 2018-03-10: 120 ug via INTRAVENOUS
  Administered 2018-03-10 (×2): 80 ug via INTRAVENOUS

## 2018-03-10 MED ORDER — MORPHINE SULFATE (PF) 4 MG/ML IV SOLN
4.0000 mg | Freq: Once | INTRAVENOUS | Status: AC
  Administered 2018-03-10: 4 mg via INTRAVENOUS
  Filled 2018-03-10: qty 1

## 2018-03-10 MED ORDER — HEMOSTATIC AGENTS (NO CHARGE) OPTIME
TOPICAL | Status: DC | PRN
  Administered 2018-03-10: 1 via TOPICAL

## 2018-03-10 MED ORDER — POTASSIUM CHLORIDE IN NACL 20-0.9 MEQ/L-% IV SOLN
INTRAVENOUS | Status: DC
  Administered 2018-03-10 – 2018-03-12 (×3): via INTRAVENOUS
  Filled 2018-03-10 (×4): qty 1000

## 2018-03-10 SURGICAL SUPPLY — 69 items
BAG DECANTER FOR FLEXI CONT (MISCELLANEOUS) IMPLANT
BATTERY IQ STERILE (MISCELLANEOUS) ×3 IMPLANT
BIT DRILL WIRE PASS 1.3MM (BIT) IMPLANT
BLADE CLIPPER SPEC (BLADE) ×3 IMPLANT
BUR PRECISION FLUTE 6.0 (BURR) ×3 IMPLANT
BUR SPIRAL ROUTER 2.3 (BUR) IMPLANT
BUR SPIRAL ROUTER 2.3MM (BUR)
CANISTER SUCT 3000ML PPV (MISCELLANEOUS) ×3 IMPLANT
CARTRIDGE OIL MAESTRO DRILL (MISCELLANEOUS) ×1 IMPLANT
CLIP VESOCCLUDE MED 6/CT (CLIP) IMPLANT
COVER BACK TABLE 60X90IN (DRAPES) IMPLANT
COVER WAND RF STERILE (DRAPES) ×3 IMPLANT
DIFFUSER DRILL AIR PNEUMATIC (MISCELLANEOUS) ×3 IMPLANT
DRAIN HEMOVAC 1/8 X 5 (WOUND CARE) ×3 IMPLANT
DRAPE NEUROLOGICAL W/INCISE (DRAPES) ×3 IMPLANT
DRAPE SURG 17X23 STRL (DRAPES) IMPLANT
DRAPE WARM FLUID 44X44 (DRAPE) ×3 IMPLANT
DRILL WIRE PASS 1.3MM (BIT)
ELECT REM PT RETURN 9FT ADLT (ELECTROSURGICAL) ×3
ELECTRODE REM PT RTRN 9FT ADLT (ELECTROSURGICAL) ×1 IMPLANT
EVACUATOR 1/8 PVC DRAIN (DRAIN) IMPLANT
EVACUATOR SILICONE 100CC (DRAIN) ×3 IMPLANT
FORCEPS BIPOLAR SPETZLER 8 1.0 (NEUROSURGERY SUPPLIES) ×3 IMPLANT
GAUZE 4X4 16PLY RFD (DISPOSABLE) IMPLANT
GAUZE SPONGE 4X4 12PLY STRL (GAUZE/BANDAGES/DRESSINGS) ×3 IMPLANT
GLOVE BIO SURGEON STRL SZ 6.5 (GLOVE) ×2 IMPLANT
GLOVE BIO SURGEON STRL SZ8 (GLOVE) ×3 IMPLANT
GLOVE BIO SURGEON STRL SZ8.5 (GLOVE) ×3 IMPLANT
GLOVE BIO SURGEONS STRL SZ 6.5 (GLOVE) ×1
GLOVE BIOGEL PI IND STRL 6.5 (GLOVE) ×2 IMPLANT
GLOVE BIOGEL PI IND STRL 7.0 (GLOVE) ×2 IMPLANT
GLOVE BIOGEL PI IND STRL 7.5 (GLOVE) ×2 IMPLANT
GLOVE BIOGEL PI INDICATOR 6.5 (GLOVE) ×4
GLOVE BIOGEL PI INDICATOR 7.0 (GLOVE) ×4
GLOVE BIOGEL PI INDICATOR 7.5 (GLOVE) ×4
GOWN STRL REUS W/ TWL LRG LVL3 (GOWN DISPOSABLE) ×2 IMPLANT
GOWN STRL REUS W/ TWL XL LVL3 (GOWN DISPOSABLE) ×1 IMPLANT
GOWN STRL REUS W/TWL LRG LVL3 (GOWN DISPOSABLE) ×4
GOWN STRL REUS W/TWL XL LVL3 (GOWN DISPOSABLE) ×2
HEMOSTAT POWDER KIT SURGIFOAM (HEMOSTASIS) ×3 IMPLANT
KIT BASIN OR (CUSTOM PROCEDURE TRAY) ×3 IMPLANT
KIT CLIP RANEY GUN (KITS) ×3 IMPLANT
KIT TURNOVER KIT B (KITS) ×3 IMPLANT
MARKER SKIN DUAL TIP RULER LAB (MISCELLANEOUS) ×3 IMPLANT
NEEDLE HYPO 22GX1.5 SAFETY (NEEDLE) ×3 IMPLANT
NS IRRIG 1000ML POUR BTL (IV SOLUTION) ×6 IMPLANT
OIL CARTRIDGE MAESTRO DRILL (MISCELLANEOUS) ×3
PACK CRANIOTOMY CUSTOM (CUSTOM PROCEDURE TRAY) ×3 IMPLANT
PAD ARMBOARD 7.5X6 YLW CONV (MISCELLANEOUS) ×3 IMPLANT
PIN MAYFIELD SKULL DISP (PIN) ×3 IMPLANT
PLATE 1.5  2HOLE LNG NEURO (Plate) ×8 IMPLANT
PLATE 1.5 2HOLE LNG NEURO (Plate) ×4 IMPLANT
RUBBERBAND STERILE (MISCELLANEOUS) ×6 IMPLANT
SCREW SELF DRILL HT 1.5/4MM (Screw) ×24 IMPLANT
SPONGE NEURO XRAY DETECT 1X3 (DISPOSABLE) IMPLANT
SPONGE SURGIFOAM ABS GEL 100 (HEMOSTASIS) ×6 IMPLANT
STAPLER SKIN PROX WIDE 3.9 (STAPLE) ×3 IMPLANT
SUT ETHILON 3 0 FSL (SUTURE) IMPLANT
SUT NURALON 4 0 TR CR/8 (SUTURE) ×3 IMPLANT
SUT PROLENE 6 0 BV (SUTURE) IMPLANT
SUT VIC AB 2-0 CP2 18 (SUTURE) ×3 IMPLANT
SUT VIC AB 3-0 FS2 27 (SUTURE) IMPLANT
SUT VICRYL 4-0 PS2 18IN ABS (SUTURE) IMPLANT
TAPE CLOTH SURG 4X10 WHT LF (GAUZE/BANDAGES/DRESSINGS) ×3 IMPLANT
TOWEL GREEN STERILE (TOWEL DISPOSABLE) ×3 IMPLANT
TOWEL GREEN STERILE FF (TOWEL DISPOSABLE) ×3 IMPLANT
TRAY FOLEY MTR SLVR 16FR STAT (SET/KITS/TRAYS/PACK) ×3 IMPLANT
UNDERPAD 30X30 (UNDERPADS AND DIAPERS) IMPLANT
WATER STERILE IRR 1000ML POUR (IV SOLUTION) ×3 IMPLANT

## 2018-03-10 NOTE — Transfer of Care (Signed)
Immediate Anesthesia Transfer of Care Note  Patient: Alvin Hudson  Procedure(s) Performed: CRANIOTOMY HEMATOMA EVACUATION SUBDURAL (Left )  Patient Location: PACU  Anesthesia Type:General  Level of Consciousness: drowsy, patient cooperative and responds to stimulation  Airway & Oxygen Therapy: Patient Spontanous Breathing and Patient connected to face mask oxygen  Post-op Assessment: Report given to RN and Post -op Vital signs reviewed and stable  Post vital signs: Reviewed and stable  Last Vitals:  Vitals Value Taken Time  BP 110/67 03/10/2018  7:10 PM  Temp 36.4 C 03/10/2018  7:10 PM  Pulse 63 03/10/2018  7:19 PM  Resp 17 03/10/2018  7:19 PM  SpO2 96 % 03/10/2018  7:19 PM  Vitals shown include unvalidated device data.  Last Pain:  Vitals:   03/10/18 1520  TempSrc: Oral  PainSc:     Report to PACU RN; informed of post-extubation confusion, Lovell Sheehan MD aware; Joslin MD informed, precedex administered, see intraoperative report; VSS. Patient resting comfortably; SBP < 160 mmHg per Lovell Sheehan MD goal.     Complications: No apparent anesthesia complications

## 2018-03-10 NOTE — ED Provider Notes (Addendum)
[facility] Emergency Department Provider Note ____________________________________________   First MD Initiated Contact with Patient 03/10/18 0708     (approximate)  I have reviewed the triage vital signs and the nursing notes.   HISTORY  Chief Complaint Aphasia  Level 5 caveat: History of present illness limited due to aphasia  HPI Kipp BroodDon Knieriem is a 83 y.o. male with PMH as noted below who presents with speech disturbance, acute onset first noted when he awoke around 1 hour ago.  The wife states that the patient went to sleep around 9 or 10 PM last night and did not have this symptom at that time.  The patient fell 9 days ago during a syncopal episode and was evaluated at that time with CT head which was negative.  He states that since then he has had relatively severe headaches.  He denies any numbness or weakness.  Past Medical History:  Diagnosis Date  . Allergy   . Seizures (HCC)     There are no active problems to display for this patient.   Past Surgical History:  Procedure Laterality Date  . INGUINAL HERNIA REPAIR Bilateral     Prior to Admission medications   Medication Sig Start Date End Date Taking? Authorizing Provider  atorvastatin (LIPITOR) 20 MG tablet Take 20 mg by mouth daily.   Yes [provider]  B Complex-C-Folic Acid (B COMPLEX-VITAMIN C-FOLIC ACID) 1 MG tablet Take 1 tablet by mouth daily. 06/25/15  Yes [provider]  levETIRAcetam (KEPPRA) 750 MG tablet Take 750 mg by mouth 2 (two) times daily.   Yes [provider]  polycarbophil (FIBERCON) 625 MG tablet Take 625 mg by mouth daily.   Yes [provider]  sertraline (ZOLOFT) 100 MG tablet Take 100 mg by mouth daily.   Yes [provider]  temazepam (RESTORIL) 15 MG capsule Take 15 mg by mouth at bedtime. 03/06/18  Yes [provider]    Allergies Aspirin and Naproxen  No family history on file.  Social  History Social History   Tobacco Use  . Smoking status: Never Smoker  . Smokeless tobacco: Never Used  Substance Use Topics  . Alcohol use: Yes    Alcohol/week: 14.0 standard drinks    Types: 14 Standard drinks or equivalent per week  . Drug use: No    Review of Systems Level 5 caveat: Unable to obtain review of systems due to aphasia    ____________________________________________   PHYSICAL EXAM:  VITAL SIGNS: ED Triage Vitals [03/10/18 0656]  Enc Vitals Group     BP (!) 161/96     Pulse Rate (!) 59     Resp (!) 24     Temp 98.1 F (36.7 C)     Temp Source Oral     SpO2 98 %     Weight      Height      Head Circumference      Peak Flow      Pain Score      Pain Loc      Pain Edu?      Excl. in GC?     Constitutional: Alert and oriented.  Relatively comfortable appearing and in no acute distress. Eyes: Conjunctivae are normal.  EOMI.  PERRLA. Head: Atraumatic. Nose: No congestion/rhinnorhea. Mouth/Throat: Mucous membranes are moist.   Neck: Normal range of motion.  Cardiovascular: Normal rate, regular rhythm. Grossly normal heart sounds.  Good peripheral circulation. Respiratory: Normal respiratory effort.  No  retractions. Lungs CTAB. Gastrointestinal: No distention.  Musculoskeletal: Extremities warm and well perfused.  Neurologic: Expressive aphasia.  No dysarthria.  Motor and sensory intact in all extremities. Skin:  No rash noted. Psychiatric: Mood and affect are normal. Speech and behavior are normal.  ____________________________________________   LABS (all labs ordered are listed, but only abnormal results are displayed)  Labs Reviewed  BASIC METABOLIC PANEL - Abnormal; Notable for the following components:      Result Value   Sodium 134 (*)    Glucose, Bld 113 (*)    All other components within normal limits  CBC - Abnormal; Notable for the following components:   RBC 3.74 (*)    MCV 106.7 (*)    MCH 35.6 (*)    All other components  within normal limits  TROPONIN I - Abnormal; Notable for the following components:   Troponin I 0.04 (*)    All other components within normal limits  GLUCOSE, CAPILLARY   ____________________________________________  EKG  ED ECG REPORT I, Dionne Bucy, the attending physician, personally viewed and interpreted this ECG.  Date: 03/10/2018 EKG Time: 0655 Rate: 60 Rhythm: normal sinus rhythm QRS Axis: normal Intervals: Prolonged PR, intraventricular conduction delay ST/T Wave abnormalities: LVH with repolarization abnormality Narrative Interpretation: no evidence of acute ischemia; no significant change when compared to EKG of 03/01/2018  ____________________________________________  RADIOLOGY  CT head: 1.5 cm subdural hematoma on the left with 3 mm midline shift  ____________________________________________   PROCEDURES  Procedure(s) performed: No  Procedures  Critical Care performed: Yes  CRITICAL CARE Performed by: Dionne Bucy   Total critical care time: 40 minutes  Critical care time was exclusive of separately billable procedures and treating other patients.  Critical care was necessary to treat or prevent imminent or life-threatening deterioration.  Critical care was time spent personally by me on the following activities: development of treatment plan with patient and/or surrogate as well as nursing, discussions with consultants, evaluation of patient's response to treatment, examination of patient, obtaining history from patient or surrogate, ordering and performing treatments and interventions, ordering and review of laboratory studies, ordering and review of radiographic studies, pulse oximetry and re-evaluation of patient's condition. ____________________________________________   INITIAL IMPRESSION / ASSESSMENT AND PLAN / ED COURSE  Pertinent labs & imaging results that were available during my care of the patient were reviewed by me and  considered in my medical decision making (see chart for details).  83 year old male with PMH as noted above including a seizure disorder presents with expressive aphasia, noted when he awoke about an hour ago.  He was last seen at his baseline before going to sleep last night around 9 PM.  He denies any other neurologic symptoms.  I reviewed the past medical records in Epic.  The patient had a syncopal episode and hit his head 9 days ago and was evaluated at that time with negative CT head and C-spine.  On exam today, the vital signs are normal.  The patient does have an expressive aphasia although the remainder of the neuro exam is normal.  Given the history of the recent fall and persistent headaches since that time I am concerned for delayed intracranial hemorrhage.  Differential also includes acute ischemic stroke or less likely atypical migraine.  Since the patient was last seen at his baseline about 10 hours ago, he is out of the window for tPA.  We will obtain CT head, lab work-up, and reassess.  ----------------------------------------- 8:06 AM on 03/10/2018 -----------------------------------------  CT head shows 1.5 cm left-sided subdural hemorrhage with 3 mm midline shift.  I discussed this with the patient and family.  Patient will need transfer for neurosurgical services.  Family prefers [facility].  I discussed the case with the [facility] transfer center and I am waiting to speak to the neurosurgeon.  ----------------------------------------- 8:29 AM on 03/10/2018 -----------------------------------------  Per neurosurgery at [facility], there are no available ICU beds, and there is a significant wait list.  The ED is full and is not accepting any transfer patients ED to ED.  I discussed this with the family.  They approve [facility] as a second choice.  I contacted [facility] transfer center to arrange for transfer there.  ----------------------------------------- 10:44 AM on  03/10/2018 -----------------------------------------  I am still waiting to speak to neurosurgery at [facility].  We have checked twice with the transfer center and they are working on finding the patient a bed.  The patient remains clinically stable with no changes in his neurologic status.  ----------------------------------------- 12:28 PM on 03/10/2018 -----------------------------------------  After several hours, [facility] transfer center informed us that they also do not have any bed availability at any of their sites and would also need to put the patient on a wait list.  The patient continues to be clinically stable with no change in his mental status or neurologic exam.  However, given the nature of his intracranial hemorrhage he needs transfer to a tertiary facility with the appropriate neurosurgical services available as soon as possible, as it will be far less safe for him to wait for transfer in the ED indefinitely.  Based on further discussion with the patient and family members, they agreed with contacting [facility]  I contacted the transfer center at [facility] and discussed the case with Dr. Lovell Sheehan from neurosurgery who kindly agreed to accept the patient as an ED to ED transfer.  I discussed the case with Dr. Lockie Mola who is the accepting physician in the ED.  The patient is stable for transfer at this time. ____________________________________________   FINAL CLINICAL IMPRESSION(S) / ED DIAGNOSES  Final diagnoses:  Subdural hematoma (HCC)      NEW MEDICATIONS STARTED DURING THIS VISIT:  New Prescriptions   No medications on file     Note:  This document was prepared using Dragon voice recognition software and may include unintentional dictation errors.    Dionne Bucy, MD 03/10/18 1334    Dionne Bucy, MD 03/10/18 1335

## 2018-03-10 NOTE — Progress Notes (Signed)
NP on call for NS made aware of current neuro exam and BP. New orders in [facility] for BP.

## 2018-03-10 NOTE — ED Triage Notes (Signed)
Pt arrives by [facility] from [facility] c/o neuro changes; pt was experiencing aphasia last night and woke up with a "terrible headache." Pt stated he has been having these headaches and has been unable "to say his words properly" since he fell on 03/01/2018. Pt is currently having some trouble saying his words. Pt is HOH. Hx of seizure ~ 2 years ago per pt. Pt a&o x4. VSS at this time.  EMS vitals BP 160/93 SR  HR 60 RR 18 97% RA

## 2018-03-10 NOTE — ED Notes (Signed)
Checked [facility] for status 0930 and 1031 still pending acceptance

## 2018-03-10 NOTE — H&P (Signed)
Subjective: The patient is an 83 year old white male who is not on any anticoagulants.  He had a seizure about 2 years ago and has been on Keppra.  He took a fall 8 days ago and was worked up with a head CT which was negative.  He has developed a progressive headache.  He woke up this morning with some faculty speaking.  He was taken to [facility] where head scan was obtained.  Attempts were made to transfer him to [facility] and [facility] but they could not accommodate him because "the ICUs were full".  Patient is transferred to [facility] for further care.  Presently the patient is accompanied by his wife and daughter.  He admits to a headache.  He had some normal movement last night, possibly a seizure.  He has had some trouble speaking.  He does not take any anticoagulants.  Past Medical History:  Diagnosis Date  . Allergy   . Seizures (HCC)     Past Surgical History:  Procedure Laterality Date  . INGUINAL HERNIA REPAIR Bilateral     Allergies  Allergen Reactions  . Aspirin   . Naproxen     Social History   Tobacco Use  . Smoking status: Never Smoker  . Smokeless tobacco: Never Used  Substance Use Topics  . Alcohol use: Yes    Alcohol/week: 14.0 standard drinks    Types: 14 Standard drinks or equivalent per week    Comment: 2 martinis/ night per pt    History reviewed. No pertinent family history. Prior to Admission medications   Medication Sig Start Date End Date Taking? Authorizing Provider  atorvastatin (LIPITOR) 20 MG tablet Take 20 mg by mouth daily.    [provider]  B Complex-C-Folic Acid (B COMPLEX-VITAMIN C-FOLIC ACID) 1 MG tablet Take 1 tablet by mouth daily. 06/25/15   [provider]  levETIRAcetam (KEPPRA) 750 MG tablet Take 750 mg by mouth 2 (two) times daily.    [provider]  polycarbophil (FIBERCON) 625 MG tablet Take 625 mg by mouth daily.    [provider]  sertraline (ZOLOFT) 100 MG tablet Take 100 mg by mouth daily.     [provider]  temazepam (RESTORIL) 15 MG capsule Take 15 mg by mouth at bedtime. 03/06/18   [provider]     Review of Systems  Positive ROS: As above  All other systems have been reviewed and were otherwise negative with the exception of those mentioned in the HPI and as above.  Objective: Vital signs in last 24 hours: Temp:  [98.1 F (36.7 C)-98.8 F (37.1 C)] 98.8 F (37.1 C) (02/14 1252) Pulse Rate:  [56-89] 62 (02/14 1430) Resp:  [11-28] 18 (02/14 1252) BP: (130-172)/(79-107) 139/88 (02/14 1430) SpO2:  [85 %-98 %] 97 % (02/14 1430) Weight:  [71.2 kg] 71.2 kg (02/14 1433) Estimated body mass index is 21.9 kg/m as calculated from the following:   Height as of this encounter: 5\' 11"  (1.803 m).   Weight as of this encounter: 71.2 kg.  General: An alert and pleasant dysphasic 83 year old white male  HEENT: Normocephalic, atraumatic, pupils equal round reactive light, extraocular muscles are intact.  Neck: Supple with normal range of motion for age, Spurling's testing is negative.  Thorax: Symmetric  Abdomen: Soft  Extremities: Unremarkable  Neurologic exam: The patient is alert and oriented x2, person and [place].  He can tell me the month but not the date.  He has an aggressive dysphasia.  His  strength is grossly normal in spinal bicep, tricep, handgrip, gastrocnemius and dorsiflexors.  Cerebellar function is intact to rapid alternating movements of the upper extremities bilaterally.  Sensory function is intact to light touch sensation all tested dermatomes bilaterally.  Cranial nerves II through XII were grossly intact bilaterally.  Imaging studies.  I reviewed the patient's head CT performed at [facility] today.  He has a moderate large acute left subdural hematoma with midline shift.       Data Review Lab Results  Component Value Date   WBC 9.3 03/10/2018   HGB 13.3 03/10/2018   HCT 39.9 03/10/2018   MCV 106.7 (H) 03/10/2018   PLT 218  03/10/2018   Lab Results  Component Value Date   NA 134 (L) 03/10/2018   K 3.5 03/10/2018   CL 99 03/10/2018   CO2 29 03/10/2018   BUN 10 03/10/2018   CREATININE 0.66 03/10/2018   GLUCOSE 113 (H) 03/10/2018   No results found for: INR, PROTIME  Assessment/Plan: Left subdural hematoma, dysphagia: I have discussed the situation with the patient and his family.  I have reviewed his imaging studies with him and pointed out the abnormalities.  We have discussed the various treatment options including observation versus surgery.  It seems that he is gotten worse over the last week so I recommended surgery.  I have described a left craniotomy for evacuation of the subdural hematoma.  We have discussed the risks of surgery including the risk of anesthesia, hemorrhage, infection, seizures, recurrent subdural hematoma, medical risk, etc.  I have answered all their questions.  He has elected to proceed with surgery.  We will do this as soon as possible.   Cristi Loron 03/10/2018 2:58 PM

## 2018-03-10 NOTE — ED Notes (Signed)
Dr. Marisa Severin notified in person troponin 0.04. no new orders.

## 2018-03-10 NOTE — ED Notes (Signed)
Report given to [place], RN with [facility]

## 2018-03-10 NOTE — ED Notes (Signed)
[employer] and Necessity forms filled. Secretary to print and Glass blower/designer.

## 2018-03-10 NOTE — ED Notes (Signed)
ED Provider at bedside. 

## 2018-03-10 NOTE — ED Provider Notes (Signed)
I personally evaluated the patient on arrival.  He is a very pleasant 83 year old male who reports that he had a fall several days ago, he reports striking his head, this occurred in the parking lot, he had had a headache for several days but today noticed that he started to have some slurred speech.  A CT scan was performed at [facility] which showed that the patient had approximately 1-1/2 cm of acute subdural hematoma surrounding his left temporal brain with some shift and the patient was subsequently transferred to this hospital for neurosurgical evaluation and potential intervention.  On my exam the patient does have some expressive aphasia but otherwise his neurologic exam shows no focal weakness or numbness, no facial droop.  I have paged the neurosurgeon, 2:20 PM, patient otherwise appears to have no change from his documented physical exam during his prior visit.     Eber Hong, MD 03/11/18 [phone]

## 2018-03-10 NOTE — ED Triage Notes (Signed)
Pt here from home via ems , with c/o . Aphasia starting this am . Pt able to respond verbally  At this time. Glucose = 95

## 2018-03-10 NOTE — ED Notes (Signed)
Called [facility] for tranfer  [phone]

## 2018-03-10 NOTE — ED Notes (Signed)
[facility] called and informed [facility] no beds available 1152, called [facility] for patient to continue to be on wait list no beds available at this time 1153, called [facility] for transfer  [phone]

## 2018-03-10 NOTE — Anesthesia Preprocedure Evaluation (Signed)
Anesthesia Evaluation  Patient identified by MRN, date of birth, ID band Patient awake    Reviewed: Allergy & Precautions, H&P , NPO status , Patient's Chart, lab work & pertinent test results  Airway Mallampati: II   Neck ROM: full    Dental   Pulmonary neg pulmonary ROS,    breath sounds clear to auscultation       Cardiovascular  Rhythm:regular Rate:Normal  hypercholesterolemia   Neuro/Psych Seizures -,  +SDH    GI/Hepatic   Endo/Other    Renal/GU      Musculoskeletal   Abdominal   Peds  Hematology   Anesthesia Other Findings   Reproductive/Obstetrics                             Anesthesia Physical Anesthesia Plan  ASA: II and emergent  Anesthesia Plan: General   Post-op Pain Management:    Induction: Intravenous  PONV Risk Score and Plan: 2 and Ondansetron, Dexamethasone and Treatment may vary due to age or medical condition  Airway Management Planned: Oral ETT  Additional Equipment: Arterial line  Intra-op Plan:   Post-operative Plan: Extubation in OR  Informed Consent: I have reviewed the patients History and Physical, chart, labs and discussed the procedure including the risks, benefits and alternatives for the proposed anesthesia with the patient or authorized representative who has indicated his/her understanding and acceptance.       Plan Discussed with: CRNA, Anesthesiologist and Surgeon  Anesthesia Plan Comments:         Anesthesia Quick Evaluation

## 2018-03-10 NOTE — ED Notes (Signed)
Dr Lovell Sheehan called from [facility] and accepted patient  [phone]

## 2018-03-10 NOTE — Progress Notes (Signed)
Subjective: The patient was a bit agitated and was given Precedex.  He is is somnolent but arousable.  He is in no apparent distress.  Objective: Vital signs in last 24 hours: Temp:  [98.1 F (36.7 C)-99.5 F (37.5 C)] 99.5 F (37.5 C) (02/14 1520) Pulse Rate:  [56-89] 73 (02/14 1500) Resp:  [11-28] 18 (02/14 1252) BP: (130-172)/(79-107) 155/95 (02/14 1500) SpO2:  [85 %-98 %] 98 % (02/14 1500) Weight:  [71.2 kg] 71.2 kg (02/14 1433) Estimated body mass index is 21.9 kg/m as calculated from the following:   Height as of this encounter: 5\' 11"  (1.803 m).   Weight as of this encounter: 71.2 kg.   Intake/Output from previous day: No intake/output data recorded. Intake/Output this shift: No intake/output data recorded.  Physical exam the patient is arousable.  Glasgow Coma Scale 11, [id number].  He mumbles a bit.  He moves all 4 extremities well.  His pupils are equal.  Lab Results: Recent Labs    03/10/18 0659  WBC 9.3  HGB 13.3  HCT 39.9  PLT 218   BMET Recent Labs    03/10/18 0659  NA 134*  K 3.5  CL 99  CO2 29  GLUCOSE 113*  BUN 10  CREATININE 0.66  CALCIUM 9.1    Studies/Results: Ct Head Wo Contrast  Result Date: 03/10/2018 CLINICAL DATA:  Aphasic.  Recent fall EXAM: CT HEAD WITHOUT CONTRAST TECHNIQUE: Contiguous axial images were obtained from the base of the skull through the vertex without intravenous contrast. COMPARISON:  March 01, 2018 FINDINGS: Brain: There is an acute left-sided subdural hematoma with involvement primarily in the left temporal region but also to a lesser extent in the left frontal region. There is a maximum thickness of this subdural hematoma 1.5 cm. There is mass effect on the superior left temporal lobe and posterior most aspect of the left frontal lobe. There is 3 mm of midline shift to the right. There is mild underlying atrophy. No intra-axial mass or intra-axial hemorrhage noted. No other extra-axial fluid collections are identified.  There is slight small vessel disease in the centra semiovale bilaterally. No evident acute infarct. Vascular: No hyperdense vessels. There is calcification in each carotid siphon region. Skull: There is a subtle linear fracture in the left temporal bone. Bony calvarium elsewhere appears intact. Sinuses/Orbits: There is mucosal thickening in several ethmoid air cells. Other visualized paranasal sinuses are clear. Visualized orbits appear symmetric bilaterally. Other: Mastoid air cells are clear. IMPRESSION: Left-sided subdural hematoma, acute, with a maximum thickness of 1.5 cm. This subdural hematoma primarily involves the left temporal region but also involves the posterior left frontal region. There is mass effect on the left temporal lobe laterally with 3 mm of midline shift toward the right. No intra-axial hemorrhage or mass. Slight periventricular small vessel disease. Nondisplaced fracture left temporal bone. Mucosal thickening in several ethmoid air cells. Critical Value/emergent results were called by telephone at the time of interpretation on 03/10/2018 at 7:48 am to the covering ED department physician, who verbally acknowledged these results. Electronically Signed   By: Bretta Bang III M.D.   On: 03/10/2018 07:49    Assessment/Plan: This post craniotomy: He seems to be doing well.  I spoke with his family.  We will plan to repeat his CAT scan tomorrow.  LOS: 0 days     Cristi Loron 03/10/2018, 7:15 PM

## 2018-03-10 NOTE — ED Notes (Signed)
Neurosurgery MD at bedside.

## 2018-03-10 NOTE — Op Note (Signed)
Brief history: The patient is an 83 year old white male who presented to the ER this morning with a fascia and a large acute left subdural hematoma.  He was worked up with a CT scan and transferred to [facility].  I discussed the various treatment options and recommended surgery.  The patient, and his family, weighed the risks, benefits and alternatives of surgery and decided proceed with a craniotomy to evacuate the subdural hematoma.  Preop diagnosis: Acute left subdural hematoma  Postop diagnosis: The same  Procedure: Left frontotemporoparietal craniotomy for evacuation of subdural hematoma  Surgeon: Dr. Delma Officer  Assistant: Hildred Priest nurse practitioner  Anesthesia: General endotracheal  Estimated blood loss: 150 cc  Specimens: None  Drains: One subtemporal Jackson-Pratt drain  Complications: None  Description of procedure: The patient was brought to the operating room by the anesthesia team.  General endotracheal anesthesia was induced.  I applied the Mayfield three-point headrest to the patient's calvarium.  We placed a roll under his left shoulder.  His head was turned to the right exposing his left scalp.  His left scalp was shaved with clippers and care with Betadine scrub and Betadine solution.  Sterile drapes were applied.  I then injected the area to be incised with Marcaine with epinephrine solution.  I then used a scalpel to make a question mark type incision in the patient's left scalp.  We used Raney clips for wound edge hemostasis.  I then used electrocautery to divide the temporalis fascia and muscle and exposed the calvarium.  We further exposed the calvarium with periosteal elevators.  I tacked back the craniotomy flap with towel clamps and rubber bands.  I then used a high-speed drill to create a left temporal and parietal bur holes.  I then used a footplate device to create a large left frontotemporoparietal craniotomy flap.  We elevated the craniotomy  flap with the periosteal elevators.  This exposed the underlying dura.  The dura was tense.  I then incised the dura with a 15 blade scalpel and the Metzenbaum scissors.  We drilled holes in the skull and tacked up the dural edges.  As expected we encountered a large acute subdural hematoma.  We evacuated with irrigation and suction.  We encountered a small cortical bleeding artery in the left temporal region.  This was likely the source of the patient's acute subdural hematoma.  We coagulated it with bipolar electrocautery.  We continued use suction and irrigation to evacuate all the acute subdural hematoma we could see.  We irrigated the subdural space with saline until came back clear.  We then reapproximated the patient's dura with interrupted 4-0 Prolene sutures.  We placed a large piece of Gelfoam over the exposed dura.  We then reapproximated the patient's craniotomy flap with titanium mini plates and screws.  We then remove the retractors.  I placed a 10 mm flat Jackson-Pratt drain in the subtemporal space and tunneled it out through a separate stab wound.  We reapproximated the temporalis fascia and muscle with interrupted 2-0 Vicryl suture.  We reapproximated the galea with interrupted 2-0 Vicryl suture.  We reapproximated the skin with stainless steel staples.  The wound was then coated with bacitracin ointment.  A sterile dressing was applied.  The drapes were removed.  I then remove the Mayfield three-point headrest from the patient's calvarium.  By report all sponge, instrument, and needle counts were correct at the end of this case.

## 2018-03-10 NOTE — Progress Notes (Addendum)
On call provider order for head CT given neuro exam. Provider made aware when CT head completed. No new orders.

## 2018-03-10 NOTE — ED Notes (Signed)
meds given as ordered for headache 7/10, will reassess pain

## 2018-03-10 NOTE — Anesthesia Procedure Notes (Signed)
Arterial Line Insertion Start/End2/14/2020 3:55 PM, 03/10/2018 3:57 PM Performed by: Marena Chancy, CRNA, CRNA  Patient location: Pre-op. Preanesthetic checklist: patient identified, IV checked, risks and benefits discussed, surgical consent, pre-op evaluation and timeout performed Lidocaine 1% used for infiltration Right, radial was placed Catheter size: 20 G Hand hygiene performed  and maximum sterile barriers used  Allen's test indicative of satisfactory collateral circulation Attempts: 1 Procedure performed without using ultrasound guided technique. Following insertion, Biopatch and dressing applied. Post procedure assessment: normal  Patient tolerated the procedure well with no immediate complications.

## 2018-03-10 NOTE — ED Notes (Signed)
[facility] unavailable for transfer, [facility] [phone]

## 2018-03-10 NOTE — ED Notes (Signed)
Report given to RN at Short Stay; pt will be going to [facility].

## 2018-03-10 NOTE — Anesthesia Procedure Notes (Signed)
Procedure Name: Intubation Date/Time: 03/10/2018 4:25 PM Performed by: Jearld Pies, CRNA Pre-anesthesia Checklist: Patient identified, Emergency Drugs available, Suction available and Patient being monitored Patient Re-evaluated:Patient Re-evaluated prior to induction Oxygen Delivery Method: Circle System Utilized Preoxygenation: Pre-oxygenation with 100% oxygen Induction Type: IV induction Ventilation: Mask ventilation without difficulty Laryngoscope Size: Mac and 3 Grade View: Grade III Tube type: Oral Tube size: 7.5 mm Number of attempts: 1 Airway Equipment and Method: Stylet and Oral airway Placement Confirmation: ETT inserted through vocal cords under direct vision,  positive ETCO2 and breath sounds checked- equal and bilateral Secured at: 22 cm Tube secured with: Tape Dental Injury: Teeth and Oropharynx as per pre-operative assessment

## 2018-03-10 NOTE — ED Notes (Signed)
Assumed care of patient. Patient alert and oriented but demonstrated difficulty pronouncing words. Ct head showing positive subdural bleed. Patient complains of pain/ headache 6/10. VSS. Wife at bedside NIH score 1 for aphagia. Call light within reach, bed low and safety maintained. Awaiting transfer. Will continue to monitor.

## 2018-03-11 LAB — POCT I-STAT 7, (LYTES, BLD GAS, ICA,H+H)
Acid-base deficit: 1 mmol/L (ref 0.0–2.0)
Bicarbonate: 23.9 mmol/L (ref 20.0–28.0)
CALCIUM ION: 1.04 mmol/L — AB (ref 1.15–1.40)
HCT: 27 % — ABNORMAL LOW (ref 39.0–52.0)
Hemoglobin: 9.2 g/dL — ABNORMAL LOW (ref 13.0–17.0)
O2 Saturation: 100 %
POTASSIUM: 3.3 mmol/L — AB (ref 3.5–5.1)
Patient temperature: 36
Sodium: 138 mmol/L (ref 135–145)
TCO2: 25 mmol/L (ref 22–32)
pCO2 arterial: 36.1 mmHg (ref 32.0–48.0)
pH, Arterial: 7.426 (ref 7.350–7.450)
pO2, Arterial: 307 mmHg — ABNORMAL HIGH (ref 83.0–108.0)

## 2018-03-11 LAB — MRSA PCR SCREENING: MRSA by PCR: NEGATIVE

## 2018-03-11 MED ORDER — PANTOPRAZOLE SODIUM 40 MG PO TBEC
40.0000 mg | DELAYED_RELEASE_TABLET | Freq: Every day | ORAL | Status: DC
  Administered 2018-03-11: 40 mg via ORAL
  Filled 2018-03-11: qty 1

## 2018-03-11 NOTE — Progress Notes (Signed)
Subjective: The patient is alert and pleasant.  He is in no apparent distress.  His wife is at the bedside.  Objective: Vital signs in last 24 hours: Temp:  [97.5 F (36.4 C)-99.5 F (37.5 C)] 98.2 F (36.8 C) (02/15 0800) Pulse Rate:  [56-89] 58 (02/15 0730) Resp:  [11-28] 20 (02/15 0730) BP: (97-172)/(67-107) 117/71 (02/15 0615) SpO2:  [85 %-100 %] 98 % (02/15 0730) Arterial Line BP: (107-177)/(49-70) 145/58 (02/15 0634) Weight:  [71.2 kg] 71.2 kg (02/14 1433) Estimated body mass index is 21.9 kg/m as calculated from the following:   Height as of this encounter: 5\' 11"  (1.803 m).   Weight as of this encounter: 71.2 kg.   Intake/Output from previous day: 02/14 0701 - 02/15 0700 In: 1668.5 [I.V.:1668.5] Out: 750 [Urine:205; Drains:195; Blood:350] Intake/Output this shift: No intake/output data recorded.  Physical exam the patient is alert and pleasant.  He continues to have a dysphagia.  He is moving all 4 extremities well.  His pupils are equal.  I have reviewed the patient's follow-up head CT performed last evening.  It looks great.  Lab Results: Recent Labs    03/10/18 0659 03/10/18 1719  WBC 9.3  --   HGB 13.3 9.2*  HCT 39.9 27.0*  PLT 218  --    BMET Recent Labs    03/10/18 0659 03/10/18 1719  NA 134* 138  K 3.5 3.3*  CL 99  --   CO2 29  --   GLUCOSE 113*  --   BUN 10  --   CREATININE 0.66  --   CALCIUM 9.1  --     Studies/Results: Ct Head Wo Contrast  Result Date: 03/10/2018 CLINICAL DATA:  83 y/o  M; subdural hemorrhage for follow-up. EXAM: CT HEAD WITHOUT CONTRAST TECHNIQUE: Contiguous axial images were obtained from the base of the skull through the vertex without intravenous contrast. COMPARISON:  03/10/2018 CT head. FINDINGS: Brain: Interval left lateral craniotomy for evacuation of subdural hematoma. There is a small postoperative air and fluid-filled collection overlying the left cerebral convexity with minimal residual blood products. There is  an interval decrease of mass effect with improved patency of the left lateral ventricle and near complete resolution of left to right midline shift. No new intracranial hemorrhage, stroke, mass lesion, hydrocephalus, or herniation is identified. Vascular: Calcific atherosclerosis of carotid siphons. No hyperdense vessel. Skull: Postsurgical changes related to a left lateral craniotomy with air and edema in the overlying scalp and a scalp drain noted. Sinuses/Orbits: Mild mucosal thickening of the maxillary sinuses and the left posterior ethmoid air cells. Normal aeration of the mastoid air cells. Orbits are unremarkable. Other: None. IMPRESSION: 1. Interval left lateral craniotomy with expected postsurgical changes. 2. Small postoperative air and fluid-filled collection overlying the left cerebral convexity with minimal residual blood products. 3. Decreased mass effect with improved patency of the left lateral ventricle and near complete resolution of left to right midline shift. 4. No new intracranial abnormality identified. Electronically Signed   By: Mitzi Hansen M.D.   On: 03/10/2018 22:00   Ct Head Wo Contrast  Result Date: 03/10/2018 CLINICAL DATA:  Aphasic.  Recent fall EXAM: CT HEAD WITHOUT CONTRAST TECHNIQUE: Contiguous axial images were obtained from the base of the skull through the vertex without intravenous contrast. COMPARISON:  March 01, 2018 FINDINGS: Brain: There is an acute left-sided subdural hematoma with involvement primarily in the left temporal region but also to a lesser extent in the left frontal region. There is a  maximum thickness of this subdural hematoma 1.5 cm. There is mass effect on the superior left temporal lobe and posterior most aspect of the left frontal lobe. There is 3 mm of midline shift to the right. There is mild underlying atrophy. No intra-axial mass or intra-axial hemorrhage noted. No other extra-axial fluid collections are identified. There is slight  small vessel disease in the centra semiovale bilaterally. No evident acute infarct. Vascular: No hyperdense vessels. There is calcification in each carotid siphon region. Skull: There is a subtle linear fracture in the left temporal bone. Bony calvarium elsewhere appears intact. Sinuses/Orbits: There is mucosal thickening in several ethmoid air cells. Other visualized paranasal sinuses are clear. Visualized orbits appear symmetric bilaterally. Other: Mastoid air cells are clear. IMPRESSION: Left-sided subdural hematoma, acute, with a maximum thickness of 1.5 cm. This subdural hematoma primarily involves the left temporal region but also involves the posterior left frontal region. There is mass effect on the left temporal lobe laterally with 3 mm of midline shift toward the right. No intra-axial hemorrhage or mass. Slight periventricular small vessel disease. Nondisplaced fracture left temporal bone. Mucosal thickening in several ethmoid air cells. Critical Value/emergent results were called by telephone at the time of interpretation on 03/10/2018 at 7:48 am to the covering ED department physician, who verbally acknowledged these results. Electronically Signed   By: Bretta Bang III M.D.   On: 03/10/2018 07:49    Assessment/Plan: Left subdural hematoma status post craniotomy: The patient is improving clinically.  His postoperative CT scan looks good.  We will continue supportive care.  I will ask PT, OT, and speech to see the patient.  LOS: 1 day     Cristi Loron 03/11/2018, 8:40 AM

## 2018-03-12 ENCOUNTER — Encounter (HOSPITAL_COMMUNITY): Payer: Self-pay

## 2018-03-12 ENCOUNTER — Inpatient Hospital Stay (HOSPITAL_COMMUNITY)
Admission: EM | Admit: 2018-03-12 | Discharge: 2018-03-20 | Disposition: A | Discharge status: Rehabilitation Facility | Payer: Medicare Other | Source: Home / Self Care | Attending: Neurosurgery | Admitting: Neurosurgery

## 2018-03-12 ENCOUNTER — Emergency Department (HOSPITAL_COMMUNITY): Payer: Medicare Other

## 2018-03-12 DIAGNOSIS — R569 Unspecified convulsions: Secondary | ICD-10-CM

## 2018-03-12 DIAGNOSIS — R531 Weakness: Secondary | ICD-10-CM

## 2018-03-12 DIAGNOSIS — R4701 Aphasia: Secondary | ICD-10-CM

## 2018-03-12 DIAGNOSIS — R509 Fever, unspecified: Secondary | ICD-10-CM

## 2018-03-12 DIAGNOSIS — R1310 Dysphagia, unspecified: Secondary | ICD-10-CM

## 2018-03-12 DIAGNOSIS — I4891 Unspecified atrial fibrillation: Secondary | ICD-10-CM

## 2018-03-12 DIAGNOSIS — J90 Pleural effusion, not elsewhere classified: Secondary | ICD-10-CM

## 2018-03-12 DIAGNOSIS — Z8679 Personal history of other diseases of the circulatory system: Secondary | ICD-10-CM

## 2018-03-12 LAB — COMPREHENSIVE METABOLIC PANEL
ALT: 32 U/L (ref 0–44)
AST: 40 U/L (ref 15–41)
Albumin: 3.5 g/dL (ref 3.5–5.0)
Alkaline Phosphatase: 49 U/L (ref 38–126)
Anion gap: 8 (ref 5–15)
BILIRUBIN TOTAL: 1.8 mg/dL — AB (ref 0.3–1.2)
BUN: 8 mg/dL (ref 8–23)
CO2: 26 mmol/L (ref 22–32)
Calcium: 9 mg/dL (ref 8.9–10.3)
Chloride: 97 mmol/L — ABNORMAL LOW (ref 98–111)
Creatinine, Ser: 0.75 mg/dL (ref 0.61–1.24)
GFR calc Af Amer: >60 mL/min (ref >60)
GFR calc non Af Amer: >60 mL/min (ref >60)
Glucose, Bld: 138 mg/dL — ABNORMAL HIGH (ref 70–99)
Potassium: 3.7 mmol/L (ref 3.5–5.1)
Sodium: 131 mmol/L — ABNORMAL LOW (ref 135–145)
Total Protein: 6.3 g/dL — ABNORMAL LOW (ref 6.5–8.1)

## 2018-03-12 LAB — CBC
HCT: 36.4 % — ABNORMAL LOW (ref 39.0–52.0)
Hemoglobin: 12 g/dL — ABNORMAL LOW (ref 13.0–17.0)
MCH: 34.9 pg — ABNORMAL HIGH (ref 26.0–34.0)
MCHC: 33 g/dL (ref 30.0–36.0)
MCV: 105.8 fL — ABNORMAL HIGH (ref 80.0–100.0)
Platelets: 252 10*3/uL (ref 150–400)
RBC: 3.44 MIL/uL — ABNORMAL LOW (ref 4.22–5.81)
RDW: 13.4 % (ref 11.5–15.5)
WBC: 13.8 10*3/uL — ABNORMAL HIGH (ref 4.0–10.5)
nRBC: 0 % (ref 0.0–0.2)

## 2018-03-12 LAB — DIFFERENTIAL
Abs Immature Granulocytes: 0.09 10*3/uL — ABNORMAL HIGH (ref 0.00–0.07)
Basophils Absolute: 0 10*3/uL (ref 0.0–0.1)
Basophils Relative: 0 %
Eosinophils Absolute: 0.1 10*3/uL (ref 0.0–0.5)
Eosinophils Relative: 0 %
Immature Granulocytes: 1 %
LYMPHS ABS: 1.8 10*3/uL (ref 0.7–4.0)
LYMPHS PCT: 13 %
Monocytes Absolute: 1.5 10*3/uL — ABNORMAL HIGH (ref 0.1–1.0)
Monocytes Relative: 11 %
Neutro Abs: 10.3 10*3/uL — ABNORMAL HIGH (ref 1.7–7.7)
Neutrophils Relative %: 75 %

## 2018-03-12 LAB — PROTIME-INR
INR: 1.1
Prothrombin Time: 14.1 seconds (ref 11.4–15.2)

## 2018-03-12 LAB — CBG MONITORING, ED: Glucose-Capillary: 129 mg/dL — ABNORMAL HIGH (ref 70–99)

## 2018-03-12 LAB — APTT: aPTT: 34 seconds (ref 24–36)

## 2018-03-12 MED ORDER — DOCUSATE SODIUM 100 MG PO CAPS: 100.0000 mg | ORAL_CAPSULE | Freq: Two times a day (BID) | ORAL | 0 refills | Status: DC

## 2018-03-12 MED ORDER — ONDANSETRON HCL 4 MG PO TABS: 4.0000 mg | ORAL_TABLET | ORAL | Status: DC | PRN

## 2018-03-12 MED ORDER — LABETALOL HCL 5 MG/ML IV SOLN
10.0000 mg | INTRAVENOUS | Status: DC | PRN
  Filled 2018-03-12: qty 4

## 2018-03-12 MED ORDER — ATORVASTATIN CALCIUM 10 MG PO TABS
20.0000 mg | ORAL_TABLET | Freq: Every day | ORAL | Status: DC
  Administered 2018-03-16 – 2018-03-20 (×5): 20 mg via ORAL
  Filled 2018-03-12 (×5): qty 2

## 2018-03-12 MED ORDER — B COMPLEX-C PO TABS
1.0000 | ORAL_TABLET | Freq: Every day | ORAL | Status: DC
  Administered 2018-03-16 – 2018-03-20 (×5): 1 via ORAL
  Filled 2018-03-12 (×8): qty 1

## 2018-03-12 MED ORDER — CALCIUM POLYCARBOPHIL 625 MG PO TABS
625.0000 mg | ORAL_TABLET | Freq: Every day | ORAL | Status: DC
  Administered 2018-03-16 – 2018-03-20 (×5): 625 mg via ORAL
  Filled 2018-03-12 (×8): qty 1

## 2018-03-12 MED ORDER — ACETAMINOPHEN 650 MG RE SUPP
650.0000 mg | RECTAL | Status: DC | PRN
  Administered 2018-03-13 – 2018-03-14 (×2): 650 mg via RECTAL
  Filled 2018-03-12 (×2): qty 1

## 2018-03-12 MED ORDER — LEVETIRACETAM IN NACL 1000 MG/100ML IV SOLN
1000.0000 mg | Freq: Once | INTRAVENOUS | Status: AC
  Administered 2018-03-12: 1000 mg via INTRAVENOUS
  Filled 2018-03-12: qty 100

## 2018-03-12 MED ORDER — HYDROCODONE-ACETAMINOPHEN 5-325 MG PO TABS: 1.0000 | ORAL_TABLET | ORAL | 0 refills | Status: DC | PRN

## 2018-03-12 MED ORDER — TEMAZEPAM 15 MG PO CAPS
15.0000 mg | ORAL_CAPSULE | Freq: Every day | ORAL | Status: DC
  Administered 2018-03-16 – 2018-03-19 (×4): 15 mg via ORAL
  Filled 2018-03-12 (×4): qty 1

## 2018-03-12 MED ORDER — ACETAMINOPHEN 325 MG PO TABS: 650.0000 mg | ORAL_TABLET | ORAL | Status: DC | PRN

## 2018-03-12 MED ORDER — SERTRALINE HCL 50 MG PO TABS
100.0000 mg | ORAL_TABLET | Freq: Every day | ORAL | Status: DC
  Administered 2018-03-16 – 2018-03-20 (×5): 100 mg via ORAL
  Filled 2018-03-12 (×5): qty 2

## 2018-03-12 MED ORDER — NEPHRO-VITE RX 1 MG PO TABS: 1.0000 | ORAL_TABLET | Freq: Every day | ORAL | Status: DC

## 2018-03-12 MED ORDER — SODIUM CHLORIDE 0.9% FLUSH
3.0000 mL | Freq: Once | INTRAVENOUS | Status: DC
Start: 2018-03-12 — End: 2018-03-12

## 2018-03-12 MED ORDER — HYDROCODONE-ACETAMINOPHEN 5-325 MG PO TABS: 1.0000 | ORAL_TABLET | ORAL | Status: DC | PRN

## 2018-03-12 MED ORDER — PROMETHAZINE HCL 25 MG PO TABS: 12.5000 mg | ORAL_TABLET | ORAL | Status: DC | PRN

## 2018-03-12 MED ORDER — ONDANSETRON HCL 4 MG/2ML IJ SOLN: 4.0000 mg | INTRAMUSCULAR | Status: DC | PRN

## 2018-03-12 MED ORDER — DOCUSATE SODIUM 100 MG PO CAPS
100.0000 mg | ORAL_CAPSULE | Freq: Two times a day (BID) | ORAL | Status: DC
  Administered 2018-03-17 – 2018-03-20 (×6): 100 mg via ORAL
  Filled 2018-03-12 (×7): qty 1

## 2018-03-12 MED ORDER — LEVETIRACETAM 750 MG PO TABS: 750.0000 mg | ORAL_TABLET | Freq: Two times a day (BID) | ORAL | Status: DC

## 2018-03-12 NOTE — ED Provider Notes (Signed)
[facility] EMERGENCY DEPARTMENT Provider Note   CSN: [id number] Arrival date & time: 03/12/18  1940     History   Chief Complaint Altered mental status  HPI Alvin Hudson is a 83 y.o. male.  HPI Patient presents to the emergency room for a change in his mental status.  Patient was recently admitted to the hospital on February 14 for evacuation of a subdural hematoma.  Patient had a craniotomy procedure by Dr. Lovell Sheehan.  Patient had an a aphasia with his initial presentation.  That resolved after the ileotomy procedure.  Family brought the patient back into the ED today because of a change in his mental status.  Patient was apparently normal before he went to take a nap.  When he woke up he was having difficulty with his speech.  Patient is also having difficulty moving his right side.  Family is primarily providing the history.  Patient is having difficulty speaking Past Medical History:  Diagnosis Date  . Allergy   . Seizures [facility])     Patient Active Problem List   Diagnosis Date Noted  . Subdural hematoma (HCC) 03/10/2018    Past Surgical History:  Procedure Laterality Date  . INGUINAL HERNIA REPAIR Bilateral         Home Medications    Prior to Admission medications   Medication Sig Start Date End Date Taking? Authorizing Provider  atorvastatin (LIPITOR) 20 MG tablet Take 20 mg by mouth daily.    [provider]  B Complex-C-Folic Acid (B COMPLEX-VITAMIN C-FOLIC ACID) 1 MG tablet Take 1 tablet by mouth daily. 06/25/15   [provider]  docusate sodium (COLACE) 100 MG capsule Take 1 capsule (100 mg total) by mouth 2 (two) times daily. 03/12/18   Tressie Stalker, MD  HYDROcodone-acetaminophen (NORCO/VICODIN) 5-325 MG tablet Take 1 tablet by mouth every 4 (four) hours as needed for moderate pain. 03/12/18   Tressie Stalker, MD  levETIRAcetam (KEPPRA) 750 MG tablet Take 750 mg by mouth 2 (two) times daily.    [provider]    polycarbophil (FIBERCON) 625 MG tablet Take 625 mg by mouth daily.    [provider]  sertraline (ZOLOFT) 100 MG tablet Take 100 mg by mouth daily.    [provider]  temazepam (RESTORIL) 15 MG capsule Take 15 mg by mouth at bedtime. 03/06/18   [provider]    Family History History reviewed. No pertinent family history.  Social History Social History   Tobacco Use  . Smoking status: Never Smoker  . Smokeless tobacco: Never Used  Substance Use Topics  . Alcohol use: Yes    Alcohol/week: 14.0 standard drinks    Types: 14 Standard drinks or equivalent per week    Comment: 2 martinis/ night per pt  . Drug use: No     Allergies   Aspirin and Naproxen   Review of Systems Review of Systems  All other systems reviewed and are negative.    Physical Exam Updated Vital Signs BP 137/88 (BP Location: Right Arm)   Resp 18   Physical Exam Vitals signs and nursing note reviewed.  Constitutional:      General: He is not in acute distress.    Appearance: He is well-developed.  HENT:     Head: Normocephalic and atraumatic.     Right Ear: External ear normal.     Left Ear: External ear normal.  Eyes:     General: No scleral icterus.  Right eye: No discharge.        Left eye: No discharge.     Conjunctiva/sclera: Conjunctivae normal.  Neck:     Musculoskeletal: Neck supple.     Trachea: No tracheal deviation.  Cardiovascular:     Rate and Rhythm: Normal rate and regular rhythm.  Pulmonary:     Effort: Pulmonary effort is normal. No respiratory distress.     Breath sounds: Normal breath sounds. No stridor. No wheezing or rales.  Abdominal:     General: Bowel sounds are normal. There is no distension.     Palpations: Abdomen is soft.     Tenderness: There is no abdominal tenderness. There is no guarding or rebound.  Musculoskeletal:        General: No tenderness.  Skin:    General: Skin is warm and dry.     Findings: No rash.   Neurological:     Mental Status: He is alert and oriented to person, place, and time.     Cranial Nerves: Cranial nerve deficit (aphasia  ) and dysarthria present.     Sensory: No sensory deficit.     Motor: Weakness present. No abnormal muscle tone or seizure activity.     Comments: Unable to hold right arm or right leg off the bed, difficulty with speech, word finding issues and aphasia      ED Treatments / Results  Labs (all labs ordered are listed, but only abnormal results are displayed) Labs Reviewed  CBC - Abnormal; Notable for the following components:      Result Value   WBC 13.8 (*)    RBC 3.44 (*)    Hemoglobin 12.0 (*)    HCT 36.4 (*)    MCV 105.8 (*)    MCH 34.9 (*)    All other components within normal limits  DIFFERENTIAL - Abnormal; Notable for the following components:   Neutro Abs 10.3 (*)    Monocytes Absolute 1.5 (*)    Abs Immature Granulocytes 0.09 (*)    All other components within normal limits  COMPREHENSIVE METABOLIC PANEL - Abnormal; Notable for the following components:   Sodium 131 (*)    Chloride 97 (*)    Glucose, Bld 138 (*)    Total Protein 6.3 (*)    Total Bilirubin 1.8 (*)    All other components within normal limits  CBG MONITORING, ED - Abnormal; Notable for the following components:   Glucose-Capillary 129 (*)    All other components within normal limits  PROTIME-INR  APTT    EKG EKG Interpretation  Date/Time:  Sunday March 12 2018 20:23:23 EST Ventricular Rate:  73 PR Interval:    QRS Duration: 150 QT Interval:  440 QTC Calculation: 485 R Axis:   -67 Text Interpretation:  Sinus rhythm Multiple premature complexes, vent & supraven Prolonged PR interval RBBB and LAFB LVH with secondary repolarization abnormality Anterior Q waves, possibly due to LVH No significant change since last tracing Confirmed by Linwood Dibbles [phone]) on 03/12/2018 8:34:24 PM   Radiology Ct Head Wo Contrast  Result Date: 03/12/2018 CLINICAL DATA:   Possible stroke, right-sided weakness with incomprehensible speech. History of seizures. EXAM: CT HEAD WITHOUT CONTRAST TECHNIQUE: Contiguous axial images were obtained from the base of the skull through the vertex without intravenous contrast. COMPARISON:  03/10/2018 FINDINGS: Brain: Redistribution of postop blood products over the left frontal and parietal convexities current exam. There is redemonstration of a small amount postoperative air overlying the left cerebral convexity. No  significant change in the appearance of brain is otherwise noted. No significant mass effect or midline shift. Chronic small vessel ischemic disease of periventricular white matter. No herniation. Midline ventricle basal cisterns without effacement. Brainstem and cerebellum are nonacute. Vascular: Atherosclerosis of the carotid siphons. No hyperdense vessel sign. Skull: Postop change from left lateral craniotomy with overlying scalp soft tissue emphysema and residual blood products with overlying skin staples. Sinuses/Orbits: Intact orbits and globes. Clear paranasal sinuses. Other: Left periorbital and malar soft tissue swelling. IMPRESSION: 1. Redistribution of postop blood products over the left frontal and parietal convexities on current exam. No significant mass effect or midline shift. No acute intracranial appearing abnormality. 2. Postop change from left lateral craniotomy with overlying scalp soft tissue emphysema and residual blood products. 3. Left periorbital and malar soft tissue swelling. Electronically Signed   By: Tollie Eth M.D.   On: 03/12/2018 20:52   Ct Head Wo Contrast  Result Date: 03/10/2018 CLINICAL DATA:  83 y/o  M; subdural hemorrhage for follow-up. EXAM: CT HEAD WITHOUT CONTRAST TECHNIQUE: Contiguous axial images were obtained from the base of the skull through the vertex without intravenous contrast. COMPARISON:  03/10/2018 CT head. FINDINGS: Brain: Interval left lateral craniotomy for evacuation of  subdural hematoma. There is a small postoperative air and fluid-filled collection overlying the left cerebral convexity with minimal residual blood products. There is an interval decrease of mass effect with improved patency of the left lateral ventricle and near complete resolution of left to right midline shift. No new intracranial hemorrhage, stroke, mass lesion, hydrocephalus, or herniation is identified. Vascular: Calcific atherosclerosis of carotid siphons. No hyperdense vessel. Skull: Postsurgical changes related to a left lateral craniotomy with air and edema in the overlying scalp and a scalp drain noted. Sinuses/Orbits: Mild mucosal thickening of the maxillary sinuses and the left posterior ethmoid air cells. Normal aeration of the mastoid air cells. Orbits are unremarkable. Other: None. IMPRESSION: 1. Interval left lateral craniotomy with expected postsurgical changes. 2. Small postoperative air and fluid-filled collection overlying the left cerebral convexity with minimal residual blood products. 3. Decreased mass effect with improved patency of the left lateral ventricle and near complete resolution of left to right midline shift. 4. No new intracranial abnormality identified. Electronically Signed   By: Mitzi Hansen M.D.   On: 03/10/2018 22:00    Procedures .Critical Care Performed by: Linwood Dibbles, MD Authorized by: Linwood Dibbles, MD   Critical care provider statement:    Critical care time (minutes):  45   Critical care was time spent personally by me on the following activities:  Discussions with consultants, evaluation of patient's response to treatment, examination of patient, ordering and performing treatments and interventions, ordering and review of laboratory studies, ordering and review of radiographic studies, pulse oximetry, re-evaluation of patient's condition, obtaining history from patient or surrogate and review of old charts   (including critical care  time)  Medications Ordered in ED Medications  sodium chloride flush (NS) 0.9 % injection 3 mL (has no administration in time range)  levETIRAcetam (KEPPRA) IVPB 1000 mg/100 mL premix (has no administration in time range)     Initial Impression / Assessment and Plan / ED Course  I have reviewed the triage vital signs and the nursing notes.  Pertinent labs & imaging results that were available during my care of the patient were reviewed by me and considered in my medical decision making (see chart for details).  Clinical Course as of Mar 12 2099  Wynelle Link  Mar 12, 2018  2038 Discussed with NSurg.  Plan on admission.     [JK]  2039 CT scan reviewed.  Radiology review still pending but on my review no large fluid collection or midline shift.   [JK]    Clinical Course User Index [JK] Linwood Dibbles, MD    Patient initially presented to the emergency room as a possible code stroke patient however patient had a recent subdural evacuation and is not a TPA candidate.  Patient does have new neurologic symptoms.  His CT scan does not show reaccumulation of blood.  Concern is for possible seizure and possible Todd's paralysis.  Patient has not taken his evening Keppra dose.  I have ordered Keppra IV.  Plan on admission to the neurosurgical service.  Final Clinical Impressions(s) / ED Diagnoses   Final diagnoses:  Aphasia  Weakness    ED Discharge Orders    None       Linwood Dibbles, MD 03/12/18 2102

## 2018-03-12 NOTE — ED Triage Notes (Signed)
Pt arrived to ER via POV, accompanied by wife and dtr; per family, pt released from hospital today at approx 1p s/p craniotomy on this past Fri. Family states patient was able to ambulate and had comprehensive speech prior to nap, but when he woke up at approx 4p pt was unable to speak and cld not utilize RA.

## 2018-03-12 NOTE — Anesthesia Postprocedure Evaluation (Signed)
Anesthesia Post Note  Patient: Doreen Balcerzak  Procedure(s) Performed: CRANIOTOMY HEMATOMA EVACUATION SUBDURAL (Left )     Patient location during evaluation: PACU Anesthesia Type: General Level of consciousness: lethargic and patient uncooperative Pain management: pain level controlled Vital Signs Assessment: post-procedure vital signs reviewed and stable Respiratory status: spontaneous breathing, nonlabored ventilation, respiratory function stable and patient connected to nasal cannula oxygen Cardiovascular status: blood pressure returned to baseline and stable Postop Assessment: no apparent nausea or vomiting Anesthetic complications: no    Last Vitals:  Vitals:   03/12/18 1000 03/12/18 1100  BP: (!) 152/130 (!) 158/89  Pulse: (!) 49 78  Resp: (!) 24   Temp:    SpO2: 95% 94%    Last Pain:  Vitals:   03/12/18 0800  TempSrc: Oral  PainSc: 4                  Arhan Mcmanamon COKER

## 2018-03-12 NOTE — Progress Notes (Signed)
Patient DC'd home via car with family.  DC instructions given and fully understood.  Indicated patient where to pick up prescriptions from.  Vital signs and assessments were stable prior to discharge.

## 2018-03-12 NOTE — Discharge Summary (Signed)
Physician Discharge Summary  Patient ID: Alvin Hudson MRN: [id number] DOB/AGE: 06/10/1935 83 y.o.  Admit date: 03/10/2018 Discharge date: 03/12/2018  Admission Diagnoses: Acute left subdural hematoma, aphasia  Discharge Diagnoses: The same Active Problems:   Subdural hematoma [facility])   Discharged Condition: good  Hospital Course: I performed a left frontotemporal parietal craniotomy for evacuation of subdural hematoma on 03/10/2018.  The surgery went well.  The patient's postoperative course was unremarkable.  His aphasia resolved.  His follow-up head CT looked good.  On 03/12/2018 the patient requested discharge to home.  He was given written and oral discharge instructions.  He was instructed to follow-up with his primary doctor regarding his hypertension.  All his questions were answered.  Consults: Physical therapy, Occupational Therapy, speech therapy Significant Diagnostic Studies: Head CTs Treatments: Left frontotemporoparietal craniotomy for evacuation of subdural hematoma Discharge Exam: Blood pressure (!) 171/117, pulse 96, temperature 98.6 F (37 C), temperature source Oral, resp. rate (!) 23, height 5\' 11"  (1.803 m), weight 71.2 kg, SpO2 95 %. The patient is alert and pleasant.  He looks well.  His speech is normal.  His strength is normal.  His dressing is clean and dry.  Disposition: Home,  Discharge Instructions    Call MD for:  difficulty breathing, headache or visual disturbances   Complete by:  As directed    Call MD for:  extreme fatigue   Complete by:  As directed    Call MD for:  hives   Complete by:  As directed    Call MD for:  persistant dizziness or light-headedness   Complete by:  As directed    Call MD for:  persistant nausea and vomiting   Complete by:  As directed    Call MD for:  redness, tenderness, or signs of infection (pain, swelling, redness, odor or green/yellow discharge around incision site)   Complete by:  As directed    Call MD for:  severe  uncontrolled pain   Complete by:  As directed    Call MD for:  temperature >100.4   Complete by:  As directed    Diet - low sodium heart healthy   Complete by:  As directed    Discharge instructions   Complete by:  As directed    Call [phone] for a followup appointment. Take a stool softener while you are using pain medications.   Driving Restrictions   Complete by:  As directed    Do not drive for 2 weeks.   Increase activity slowly   Complete by:  As directed    Lifting restrictions   Complete by:  As directed    Do not lift more than 5 pounds. No excessive bending or twisting.   May shower / Bathe   Complete by:  As directed    Remove the dressing for 3 days after surgery.  You may shower, but leave the incision alone.   Remove dressing in 24 hours   Complete by:  As directed      Allergies as of 03/12/2018      Reactions   Aspirin    Hole in his stomach   Naproxen       Medication List    TAKE these medications   atorvastatin 20 MG tablet Commonly known as:  LIPITOR Take 20 mg by mouth daily.   B complex-vitamin C-folic acid 1 MG tablet Take 1 tablet by mouth daily.   docusate sodium 100 MG capsule Commonly known as:  COLACE Take  1 capsule (100 mg total) by mouth 2 (two) times daily.   FIBERCON 625 MG tablet Generic drug:  polycarbophil Take 625 mg by mouth daily.   HYDROcodone-acetaminophen 5-325 MG tablet Commonly known as:  NORCO/VICODIN Take 1 tablet by mouth every 4 (four) hours as needed for moderate pain.   levETIRAcetam 750 MG tablet Commonly known as:  KEPPRA Take 750 mg by mouth 2 (two) times daily.   sertraline 100 MG tablet Commonly known as:  ZOLOFT Take 100 mg by mouth daily.   temazepam 15 MG capsule Commonly known as:  RESTORIL Take 15 mg by mouth at bedtime.        Signed: Cristi Loron 03/12/2018, 8:46 AM

## 2018-03-12 NOTE — Evaluation (Signed)
Physical Therapy Evaluation Patient Details Name: Alvin Hudson MRN: [id number] DOB: 03-Jun-1935 Today's Date: 03/12/2018   History of Present Illness  83yo males who sustained a fall 8 days ago worked up with CT which was negative. Pt developed progressive headache and woke up with difficulty speaking. CT revealed large L frontal temporal lobe hemorrhage. Pt underwent a L crani for evacuation of hemorrhage.  Clinical Impression  Pt admitted with above. Pt extremely HOH and fidgity at baseline. Despite shakiness pt ambulated 500' without AD and negotiated 12 stairs with min guard assist. Pt with report that his wife can assist 24/7 and they stay on the first floor of their home. Acute PT to cont to follow to progress indep.     Follow Up Recommendations No PT follow up;Supervision/Assistance - 24 hour    Equipment Recommendations  None recommended by PT    Recommendations for Other Services       Precautions / Restrictions Precautions Precautions: Fall Precaution Comments: figidity Restrictions Weight Bearing Restrictions: No      Mobility  Bed Mobility Overal bed mobility: Modified Independent             General bed mobility comments: HOB flat, mild increased time, pt very shaky/fidgity  Transfers Overall transfer level: Needs assistance Equipment used: None Transfers: Sit to/from Stand Sit to Stand: Min guard         General transfer comment: min guard due to first time up, pt steady despite shakiness/fidgity  Ambulation/Gait Ambulation/Gait assistance: Min guard Gait Distance (Feet): 500 Feet Assistive device: None Gait Pattern/deviations: WFL(Within Functional Limits) Gait velocity: wfll   General Gait Details: no episodes of LOB, mildly unsteady during quick turns. Pt remains mildly shaky/fidgity  Stairs Stairs: Yes Stairs assistance: Min guard Stair Management: One rail Left;Alternating pattern Number of Stairs: 12 General stair comments: no  difficulty  Wheelchair Mobility    Modified Rankin (Stroke Patients Only) Modified Rankin (Stroke Patients Only) Pre-Morbid Rankin Score: No symptoms Modified Rankin: Slight disability     Balance Overall balance assessment: Mild deficits observed, not formally tested                                           Pertinent Vitals/Pain Pain Assessment: 0-10 Pain Score: 2  Pain Location: headache Pain Descriptors / Indicators: Headache Pain Intervention(s): RN gave pain meds during session    Home Living Family/patient expects to be discharged to:: Private residence Living Arrangements: Spouse/significant other Available Help at Discharge: Family;Available 24 hours/day Type of Home: House Home Access: Stairs to enter Entrance Stairs-Rails: None Entrance Stairs-Number of Steps: 3 Home Layout: Two level;Able to live on main level with bedroom/bathroom Home Equipment: None      Prior Function Level of Independence: Independent         Comments: no AD, drives     Hand Dominance   Dominant Hand: Right    Extremity/Trunk Assessment   Upper Extremity Assessment Upper Extremity Assessment: Overall WFL for tasks assessed    Lower Extremity Assessment Lower Extremity Assessment: Overall WFL for tasks assessed    Cervical / Trunk Assessment Cervical / Trunk Assessment: Normal  Communication   Communication: Expressive difficulties;HOH(mild word finding difficulties, extremely HOH)  Cognition Arousal/Alertness: Awake/alert Behavior During Therapy: WFL for tasks assessed/performed Overall Cognitive Status: Within Functional Limits for tasks assessed  General Comments: pt very HOH which may contribute to his delayed processing and ability to follow commands accurately      General Comments General comments (skin integrity, edema, etc.): VSS, pt with edema around L eye, pt with dressing on L side of head,  pt sat EOB without difficulty to use urinal and Jacinto socks    Exercises     Assessment/Plan    PT Assessment Patient needs continued PT services  PT Problem List Decreased activity tolerance;Decreased balance;Decreased mobility       PT Treatment Interventions DME instruction;Gait training;Stair training;Functional mobility training;Therapeutic activities;Therapeutic exercise;Balance training;Cognitive remediation;Neuromuscular re-education    PT Goals (Current goals can be found in the Care Plan section)  Acute Rehab PT Goals Patient Stated Goal: home today PT Goal Formulation: With patient Time For Goal Achievement: 03/26/18 Potential to Achieve Goals: Good Additional Goals Additional Goal #1: Pt to score >19 on DGI to inidicate minimal falls risk.    Frequency Min 4X/week   Barriers to discharge        Co-evaluation               AM-PAC PT "6 Clicks" Mobility  Outcome Measure Help needed turning from your back to your side while in a flat bed without using bedrails?: None Help needed moving from lying on your back to sitting on the side of a flat bed without using bedrails?: None Help needed moving to and from a bed to a chair (including a wheelchair)?: None Help needed standing up from a chair using your arms (e.g., wheelchair or bedside chair)?: None Help needed to walk in hospital room?: A Little Help needed climbing 3-5 steps with a railing? : A Little 6 Click Score: 22    End of Session Equipment Utilized During Treatment: Gait belt Activity Tolerance: Patient tolerated treatment well Patient left: in chair;with call bell/phone within reach;with nursing/sitter in room Nurse Communication: Mobility status PT Visit Diagnosis: Unsteadiness on feet (R26.81);History of falling (Z91.81)    Time: 2330-0762 PT Time Calculation (min) (ACUTE ONLY): 22 min   Charges:   PT Evaluation $PT Eval Moderate Complexity: 1 Mod          Lewis Shock, PT,  DPT Acute Rehabilitation Services Pager #: [phone] Office #: [phone]   Iona Hansen 03/12/2018, 10:43 AM

## 2018-03-13 ENCOUNTER — Encounter (HOSPITAL_COMMUNITY): Payer: Self-pay | Admitting: Neurosurgery

## 2018-03-13 ENCOUNTER — Inpatient Hospital Stay (HOSPITAL_COMMUNITY): Payer: Medicare Other

## 2018-03-13 DIAGNOSIS — R509 Fever, unspecified: Secondary | ICD-10-CM

## 2018-03-13 DIAGNOSIS — I609 Nontraumatic subarachnoid hemorrhage, unspecified: Secondary | ICD-10-CM

## 2018-03-13 DIAGNOSIS — R410 Disorientation, unspecified: Secondary | ICD-10-CM

## 2018-03-13 LAB — URINALYSIS, COMPLETE (UACMP) WITH MICROSCOPIC
BILIRUBIN URINE: NEGATIVE
Glucose, UA: NEGATIVE mg/dL
Ketones, ur: 80 mg/dL — AB
LEUKOCYTE UA: NEGATIVE
Nitrite: NEGATIVE
Protein, ur: 30 mg/dL — AB
SPECIFIC GRAVITY, URINE: 1.032 — AB (ref 1.005–1.030)
pH: 6 (ref 5.0–8.0)

## 2018-03-13 MED ORDER — METOPROLOL TARTRATE 5 MG/5ML IV SOLN
INTRAVENOUS | Status: AC
  Administered 2018-03-13: 5 mg
  Filled 2018-03-13: qty 5

## 2018-03-13 MED ORDER — METOPROLOL TARTRATE 5 MG/5ML IV SOLN: 5.0000 mg | Freq: Once | INTRAVENOUS | Status: AC

## 2018-03-13 MED ORDER — ORAL CARE MOUTH RINSE
15.0000 mL | Freq: Two times a day (BID) | OROMUCOSAL | Status: DC
  Administered 2018-03-14 – 2018-03-19 (×9): 15 mL via OROMUCOSAL

## 2018-03-13 MED ORDER — NICARDIPINE HCL IN NACL 20-0.86 MG/200ML-% IV SOLN
INTRAVENOUS | Status: AC
  Filled 2018-03-13: qty 200

## 2018-03-13 MED ORDER — SODIUM CHLORIDE 0.9 % IV SOLN
750.0000 mg | Freq: Two times a day (BID) | INTRAVENOUS | Status: DC
  Administered 2018-03-13 – 2018-03-14 (×3): 750 mg via INTRAVENOUS
  Filled 2018-03-13 (×3): qty 7.5

## 2018-03-13 MED ORDER — GADOBUTROL 1 MMOL/ML IV SOLN
7.0000 mL | Freq: Once | INTRAVENOUS | Status: AC | PRN
  Administered 2018-03-13: 7 mL via INTRAVENOUS

## 2018-03-13 MED ORDER — CHLORHEXIDINE GLUCONATE 0.12 % MT SOLN
15.0000 mL | Freq: Two times a day (BID) | OROMUCOSAL | Status: DC
  Administered 2018-03-14 – 2018-03-20 (×13): 15 mL via OROMUCOSAL
  Filled 2018-03-13 (×9): qty 15

## 2018-03-13 MED ORDER — DILTIAZEM HCL-DEXTROSE 100-5 MG/100ML-% IV SOLN (PREMIX)
5.0000 mg/h | INTRAVENOUS | Status: DC
  Administered 2018-03-13: 5 mg/h via INTRAVENOUS
  Administered 2018-03-14 – 2018-03-16 (×5): 10 mg/h via INTRAVENOUS
  Filled 2018-03-13 (×8): qty 100

## 2018-03-13 MED ORDER — HALOPERIDOL LACTATE 5 MG/ML IJ SOLN
3.0000 mg | Freq: Once | INTRAMUSCULAR | Status: AC
  Administered 2018-03-13: 3 mg via INTRAVENOUS

## 2018-03-13 MED ORDER — SODIUM CHLORIDE 0.9 % IV SOLN
INTRAVENOUS | Status: DC
  Administered 2018-03-13: 20:00:00 via INTRAVENOUS

## 2018-03-13 MED ORDER — HALOPERIDOL LACTATE 5 MG/ML IJ SOLN
2.0000 mg | Freq: Four times a day (QID) | INTRAMUSCULAR | Status: DC | PRN
  Administered 2018-03-13: 2 mg via INTRAVENOUS
  Filled 2018-03-13: qty 1

## 2018-03-13 NOTE — Progress Notes (Signed)
LTM rehooked. No initial skin breakdown.

## 2018-03-13 NOTE — Progress Notes (Signed)
vLTM EEG running. No skin breakdown.  

## 2018-03-13 NOTE — Progress Notes (Signed)
LTM paused, eletrodes removed for pt to go to MRI.

## 2018-03-13 NOTE — Consult Note (Signed)
Neurology Consultation Note  Consult Requested by: Dr. Lovell Sheehan  Reason for Consult: AMS  Consult Date: 03/13/18  The history was obtained from the wife and daughters.  During history and examination, all items were able to obtain unless otherwise noted.  History of Present Illness:  Alvin Hudson is a 83 y.o. Caucasian male with PMH of seizure on Keppra and MCI following with Dr. Rulon Eisenmenger at [facility] first presented to ED status post fall on 03/01/2018, CT no acute abnormality and he was discharged home.  Over the time, he had progressive headache and became aphasic on 03/10/2018 along with one possible episode of tonic-clonic seizure during sleep.  CT showed left temporoparietal SDH.  He was admitted and underwent surgical evacuation by Dr. Lovell Sheehan.  Repeat CT post op showed decreased mass-effect and midline shift.  He was discharged yesterday home.  At home he had done nap, after getting up from nap, he was found to have altered mental status, global aphasia, not talking, not comprehend questions, with right arm weakness, and visual hallucinations.  He was sent back to ED, repeat CT showed no acute changes from last CT.  He was admitted with concerns for seizure.  He was given Keppra 1g load and then continued with Keppra 750 twice daily home dose.  He was put on long-term EEG today, so far no seizure seen.  However, patient neurologically no improvement since admission, neurology consulted for further assistant.  Patient had history of possible seizure on Keppra, and her treatment with Dr. Rulon Eisenmenger at [facility].  According to chart, "he had several spells consistent with seizure- none since he has been on keppra. At least 4 spells of dysfunction on 2 separate occasions. The first occurred while hanging a picture, with arms extended over his head and looking up- not very long at all. He felt dizzy and had a metallic taste in his mouth, followed by what his wife describes as "dithering"- he fiddled  with items and did not speak or follow commands, although he remained awake, eyes open and using both arms and legs. He was back to normal in 1-2 minutes and recalls the episode. The second occurred in his car, leaning forward to get something out of the glove-box, he felt a little dizzy, similar to episode #1 and then tasted a "date" and proceeded to look for the date in the car- again picking at items, aimlessly with complete resolution in 1-2 minutes. The other 2 spells occurred while walking up a slope he felt dizzy briefly, stopped with complete resolution of symptoms and a second while looking up at some roofers, he felt dizzy."   He was also recently follow with Dr. Rulon Eisenmenger for cognitive impairment, memory loss and diagnosed with MCI.  No medication given.  As per family, patient generally healthy gentleman at home, not taking medication except Keppra.  Denies smoking, alcohol or illicit drugs.   Past Medical History:  Diagnosis Date  . Allergy   . Seizures (HCC)     Past Surgical History:  Procedure Laterality Date  . CRANIOTOMY Left 03/10/2018   Procedure: CRANIOTOMY HEMATOMA EVACUATION SUBDURAL;  Surgeon: Tressie Stalker, MD;  Location: [facility] OR;  Service: Neurosurgery;  Laterality: Left;  . INGUINAL HERNIA REPAIR Bilateral     History reviewed. No pertinent family history.  Social History:  reports that he has never smoked. He has never used smokeless tobacco. He reports current alcohol use of about 14.0 standard drinks of alcohol per week. He reports  that he does not use drugs.  Allergies:  Allergies  Allergen Reactions  . Aspirin Other (See Comments)    Caused ulcers  . Naproxen Other (See Comments)    Caused ulcers    No current facility-administered medications on file prior to encounter.    Current Outpatient Medications on File Prior to Encounter  Medication Sig Dispense Refill  . atorvastatin (LIPITOR) 20 MG tablet Take 20 mg by mouth daily.    . B Complex-C-Folic  Acid (B COMPLEX-VITAMIN C-FOLIC ACID) 1 MG tablet Take 1 tablet by mouth daily.    Marland Kitchen docusate sodium (COLACE) 100 MG capsule Take 1 capsule (100 mg total) by mouth 2 (two) times daily. 60 capsule 0  . HYDROcodone-acetaminophen (NORCO/VICODIN) 5-325 MG tablet Take 1 tablet by mouth every 4 (four) hours as needed for moderate pain. 30 tablet 0  . levETIRAcetam (KEPPRA) 750 MG tablet Take 750 mg by mouth 2 (two) times daily.    . polycarbophil (FIBERCON) 625 MG tablet Take 625 mg by mouth daily.    . sertraline (ZOLOFT) 100 MG tablet Take 100 mg by mouth daily.    . temazepam (RESTORIL) 15 MG capsule Take 15 mg by mouth at bedtime.      Review of Systems: A full ROS was attempted today and was not able to be performed due to global aphasia.  Physical Examination: Temp:  [98.4 F (36.9 C)-100.3 F (37.9 C)] 100.3 F (37.9 C) (02/17 1200) Pulse Rate:  [64-94] 94 (02/17 1300) Resp:  [11-28] 20 (02/17 1300) BP: (108-159)/(66-138) 130/80 (02/17 1300) SpO2:  [92 %-99 %] 96 % (02/17 1300)  General - well nourished, well developed, mildly agitated, restless.    Ophthalmologic - fundi not visualized due to noncooperation.    Cardiovascular - regular rate and rhythm  Neuro -awake alert, restless, mildly agitated, global aphasia, only make limited sound, not following commands.  Not able to name or repeat.  Neck supple, no meningismus sign.  PERRL, EOMI, able to track on both sides.  Not consistent with visual threat testing, however seems to have right hemianopia versus neglect.  Right facial droop, tongue midline.  Left upper and bilateral lower extremities normal strength, right upper extremity drift with 3/5 proximal and 0/5 distal with hand gripping.  DTR 1+, no Babinski. Sensation, coordination and gait not tested.   Data Reviewed: Ct Head Wo Contrast  Result Date: 03/12/2018 CLINICAL DATA:  Possible stroke, right-sided weakness with incomprehensible speech. History of seizures. EXAM: CT  HEAD WITHOUT CONTRAST TECHNIQUE: Contiguous axial images were obtained from the base of the skull through the vertex without intravenous contrast. COMPARISON:  03/10/2018 FINDINGS: Brain: Redistribution of postop blood products over the left frontal and parietal convexities current exam. There is redemonstration of a small amount postoperative air overlying the left cerebral convexity. No significant change in the appearance of brain is otherwise noted. No significant mass effect or midline shift. Chronic small vessel ischemic disease of periventricular white matter. No herniation. Midline ventricle basal cisterns without effacement. Brainstem and cerebellum are nonacute. Vascular: Atherosclerosis of the carotid siphons. No hyperdense vessel sign. Skull: Postop change from left lateral craniotomy with overlying scalp soft tissue emphysema and residual blood products with overlying skin staples. Sinuses/Orbits: Intact orbits and globes. Clear paranasal sinuses. Other: Left periorbital and malar soft tissue swelling. IMPRESSION: 1. Redistribution of postop blood products over the left frontal and parietal convexities on current exam. No significant mass effect or midline shift. No acute intracranial appearing abnormality. 2. Postop  change from left lateral craniotomy with overlying scalp soft tissue emphysema and residual blood products. 3. Left periorbital and malar soft tissue swelling. Electronically Signed   By: Tollie Eth M.D.   On: 03/12/2018 20:52   Ct Head Wo Contrast  Result Date: 03/10/2018 CLINICAL DATA:  83 y/o  M; subdural hemorrhage for follow-up. EXAM: CT HEAD WITHOUT CONTRAST TECHNIQUE: Contiguous axial images were obtained from the base of the skull through the vertex without intravenous contrast. COMPARISON:  03/10/2018 CT head. FINDINGS: Brain: Interval left lateral craniotomy for evacuation of subdural hematoma. There is a small postoperative air and fluid-filled collection overlying the left  cerebral convexity with minimal residual blood products. There is an interval decrease of mass effect with improved patency of the left lateral ventricle and near complete resolution of left to right midline shift. No new intracranial hemorrhage, stroke, mass lesion, hydrocephalus, or herniation is identified. Vascular: Calcific atherosclerosis of carotid siphons. No hyperdense vessel. Skull: Postsurgical changes related to a left lateral craniotomy with air and edema in the overlying scalp and a scalp drain noted. Sinuses/Orbits: Mild mucosal thickening of the maxillary sinuses and the left posterior ethmoid air cells. Normal aeration of the mastoid air cells. Orbits are unremarkable. Other: None. IMPRESSION: 1. Interval left lateral craniotomy with expected postsurgical changes. 2. Small postoperative air and fluid-filled collection overlying the left cerebral convexity with minimal residual blood products. 3. Decreased mass effect with improved patency of the left lateral ventricle and near complete resolution of left to right midline shift. 4. No new intracranial abnormality identified. Electronically Signed   By: Mitzi Hansen M.D.   On: 03/10/2018 22:00   Ct Head Wo Contrast  Result Date: 03/10/2018 CLINICAL DATA:  Aphasic.  Recent fall EXAM: CT HEAD WITHOUT CONTRAST TECHNIQUE: Contiguous axial images were obtained from the base of the skull through the vertex without intravenous contrast. COMPARISON:  March 01, 2018 FINDINGS: Brain: There is an acute left-sided subdural hematoma with involvement primarily in the left temporal region but also to a lesser extent in the left frontal region. There is a maximum thickness of this subdural hematoma 1.5 cm. There is mass effect on the superior left temporal lobe and posterior most aspect of the left frontal lobe. There is 3 mm of midline shift to the right. There is mild underlying atrophy. No intra-axial mass or intra-axial hemorrhage noted. No  other extra-axial fluid collections are identified. There is slight small vessel disease in the centra semiovale bilaterally. No evident acute infarct. Vascular: No hyperdense vessels. There is calcification in each carotid siphon region. Skull: There is a subtle linear fracture in the left temporal bone. Bony calvarium elsewhere appears intact. Sinuses/Orbits: There is mucosal thickening in several ethmoid air cells. Other visualized paranasal sinuses are clear. Visualized orbits appear symmetric bilaterally. Other: Mastoid air cells are clear. IMPRESSION: Left-sided subdural hematoma, acute, with a maximum thickness of 1.5 cm. This subdural hematoma primarily involves the left temporal region but also involves the posterior left frontal region. There is mass effect on the left temporal lobe laterally with 3 mm of midline shift toward the right. No intra-axial hemorrhage or mass. Slight periventricular small vessel disease. Nondisplaced fracture left temporal bone. Mucosal thickening in several ethmoid air cells. Critical Value/emergent results were called by telephone at the time of interpretation on 03/10/2018 at 7:48 am to the covering ED department physician, who verbally acknowledged these results. Electronically Signed   By: Bretta Bang III M.D.   On: 03/10/2018 07:49  Ct Head Wo Contrast  Result Date: 03/01/2018 CLINICAL DATA:  Fall.  Headache EXAM: CT HEAD WITHOUT CONTRAST CT CERVICAL SPINE WITHOUT CONTRAST TECHNIQUE: Multidetector CT imaging of the head and cervical spine was performed following the standard protocol without intravenous contrast. Multiplanar CT image reconstructions of the cervical spine were also generated. COMPARISON:  None. FINDINGS: CT HEAD FINDINGS Brain: Generalized atrophy without hydrocephalus. Negative for acute infarct, hemorrhage, or mass lesion. Vascular: Negative for hyperdense vessel Skull: Negative foot Sinuses/Orbits: Negative Other: None CT CERVICAL SPINE FINDINGS  Alignment: Mild anterolisthesis C2-3. Mild retrolisthesis C3-4, C4-5, C5-6, and C6-7. Skull base and vertebrae: Negative for fracture Soft tissues and spinal canal: Negative Disc levels: Advanced disc degeneration and spurring C3 through C7. Multilevel spinal and foraminal stenosis due to spurring. Upper chest: Negative Other: None IMPRESSION: 1. No acute intracranial abnormality 2. Advanced cervical spondylosis.  Negative for fracture. Electronically Signed   By: Marlan Palauharles  Clark M.D.   On: 03/01/2018 16:33   Ct Cervical Spine Wo Contrast  Result Date: 03/01/2018 CLINICAL DATA:  Fall.  Headache EXAM: CT HEAD WITHOUT CONTRAST CT CERVICAL SPINE WITHOUT CONTRAST TECHNIQUE: Multidetector CT imaging of the head and cervical spine was performed following the standard protocol without intravenous contrast. Multiplanar CT image reconstructions of the cervical spine were also generated. COMPARISON:  None. FINDINGS: CT HEAD FINDINGS Brain: Generalized atrophy without hydrocephalus. Negative for acute infarct, hemorrhage, or mass lesion. Vascular: Negative for hyperdense vessel Skull: Negative foot Sinuses/Orbits: Negative Other: None CT CERVICAL SPINE FINDINGS Alignment: Mild anterolisthesis C2-3. Mild retrolisthesis C3-4, C4-5, C5-6, and C6-7. Skull base and vertebrae: Negative for fracture Soft tissues and spinal canal: Negative Disc levels: Advanced disc degeneration and spurring C3 through C7. Multilevel spinal and foraminal stenosis due to spurring. Upper chest: Negative Other: None IMPRESSION: 1. No acute intracranial abnormality 2. Advanced cervical spondylosis.  Negative for fracture. Electronically Signed   By: Marlan Palauharles  Clark M.D.   On: 03/01/2018 16:33    Assessment: 83 y.o. male PMH of seizure on Keppra and MCI [facility] for temporoparietal SDH. Underwent surgical evacuation by Dr. Lovell SheehanJenkins and repeat CT post op showed decreased mass-effect and midline shift.  He was  discharged yesterday home.  At home after nap, developed acute onset altered mental status, global aphasia, and right arm weakness. Repeat CT showed no acute changes. Received Keppra 1g load for concerns of seizure.  However, long-term EEG so far no seizure seen.  Patient neurologically no improvement so far.   Will need to have MRI to rule out stroke given global aphasia and right arm weakness and right facial droop.  Patient does have low-grade fever and leukocytosis, DDX also include encephalitis, brain abscess given recent surgery.  However, neck supple, no meningismus.  Metabolic encephalopathy also in different diagnosis given mildly hyponatremia and fever, however, this normally not happen acutely had seen this case.  Will check CBC/BMP/ammonia in a.m.  Continue Keppra and long-term EEG for seizure prevention and monitoring.  Plan: -Continue long-term EEG -Continue Keppra 750 twice daily -MRI with and without contrast and MRA stat -Chest x-ray, UA and blood culture for infectious work-up -Tylenol for fever -Monitor CBC, BMP and ammonia level -low dose Haldol PRN for agitation and restless -(QTC [phone]) -seizure precaution -ativan PRN for seizure episode - will follow  Thank you for this consultation and allowing us to participate in the care of this patient.   Marvel PlanJindong Teea Ducey, MD PhD Stroke Neurology 03/13/2018 2:40 PM

## 2018-03-13 NOTE — Progress Notes (Addendum)
Upon transfer from MRI table to ICU bed, pt HR was 150-160 a-fib/RVR. MD Marvel Plan was paged and informed of sustained high HR. EKG was obtained, cardizem gtt and metoprolol PRN ordered. Neurosurgery-Kim Meyran, NP has been informed- no new orders from neurosurgery at this time.

## 2018-03-13 NOTE — Progress Notes (Signed)
Subjective: The patient has recently been sedated.  His daughter is at the bedside.  Objective: Vital signs in last 24 hours: Temp:  [98.4 F (36.9 C)-100.3 F (37.9 C)] 100.3 F (37.9 C) (02/17 1200) Pulse Rate:  [64-98] 98 (02/17 1400) Resp:  [11-28] 22 (02/17 1400) BP: (108-159)/(66-138) 138/88 (02/17 1400) SpO2:  [92 %-99 %] 94 % (02/17 1400) Estimated body mass index is 21.9 kg/m as calculated from the following:   Height as of 03/10/18: 5\' 11"  (1.803 m).   Weight as of 03/10/18: 71.2 kg.   Intake/Output from previous day: 02/16 0701 - 02/17 0700 In: -  Out: 325 [Urine:325] Intake/Output this shift: Total I/O In: -  Out: 350 [Urine:350]  Physical exam by report the patient moves all 4 extremities.  He is not talking.  Lab Results: Recent Labs    03/10/18 1719 03/12/18 2001  WBC  --  13.8*  HGB 9.2* 12.0*  HCT 27.0* 36.4*  PLT  --  252   BMET Recent Labs    03/10/18 1719 03/12/18 2001  NA 138 131*  K 3.3* 3.7  CL  --  97*  CO2  --  26  GLUCOSE  --  138*  BUN  --  8  CREATININE  --  0.75  CALCIUM  --  9.0    Studies/Results: Ct Head Wo Contrast  Result Date: 03/12/2018 CLINICAL DATA:  Possible stroke, right-sided weakness with incomprehensible speech. History of seizures. EXAM: CT HEAD WITHOUT CONTRAST TECHNIQUE: Contiguous axial images were obtained from the base of the skull through the vertex without intravenous contrast. COMPARISON:  03/10/2018 FINDINGS: Brain: Redistribution of postop blood products over the left frontal and parietal convexities current exam. There is redemonstration of a small amount postoperative air overlying the left cerebral convexity. No significant change in the appearance of brain is otherwise noted. No significant mass effect or midline shift. Chronic small vessel ischemic disease of periventricular white matter. No herniation. Midline ventricle basal cisterns without effacement. Brainstem and cerebellum are nonacute. Vascular:  Atherosclerosis of the carotid siphons. No hyperdense vessel sign. Skull: Postop change from left lateral craniotomy with overlying scalp soft tissue emphysema and residual blood products with overlying skin staples. Sinuses/Orbits: Intact orbits and globes. Clear paranasal sinuses. Other: Left periorbital and malar soft tissue swelling. IMPRESSION: 1. Redistribution of postop blood products over the left frontal and parietal convexities on current exam. No significant mass effect or midline shift. No acute intracranial appearing abnormality. 2. Postop change from left lateral craniotomy with overlying scalp soft tissue emphysema and residual blood products. 3. Left periorbital and malar soft tissue swelling. Electronically Signed   By: Tollie Eth M.D.   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  His CAT scan looks good.  We will see what the MRI scan shows.  I appreciate neurology's input.  LOS: 1 day     Cristi Loron 03/13/2018, 3:38 PM

## 2018-03-14 ENCOUNTER — Inpatient Hospital Stay (HOSPITAL_COMMUNITY): Payer: Medicare Other

## 2018-03-14 DIAGNOSIS — I4891 Unspecified atrial fibrillation: Secondary | ICD-10-CM

## 2018-03-14 DIAGNOSIS — I447 Left bundle-branch block, unspecified: Secondary | ICD-10-CM

## 2018-03-14 DIAGNOSIS — R55 Syncope and collapse: Secondary | ICD-10-CM

## 2018-03-14 DIAGNOSIS — I480 Paroxysmal atrial fibrillation: Secondary | ICD-10-CM

## 2018-03-14 DIAGNOSIS — S065X9A Traumatic subdural hemorrhage with loss of consciousness of unspecified duration, initial encounter: Secondary | ICD-10-CM

## 2018-03-14 LAB — CBC
HCT: 33 % — ABNORMAL LOW (ref 39.0–52.0)
HEMOGLOBIN: 11 g/dL — AB (ref 13.0–17.0)
MCH: 34.6 pg — ABNORMAL HIGH (ref 26.0–34.0)
MCHC: 33.3 g/dL (ref 30.0–36.0)
MCV: 103.8 fL — ABNORMAL HIGH (ref 80.0–100.0)
Platelets: 240 10*3/uL (ref 150–400)
RBC: 3.18 MIL/uL — ABNORMAL LOW (ref 4.22–5.81)
RDW: 13.1 % (ref 11.5–15.5)
WBC: 12.9 10*3/uL — ABNORMAL HIGH (ref 4.0–10.5)
nRBC: 0 % (ref 0.0–0.2)

## 2018-03-14 LAB — BASIC METABOLIC PANEL
Anion gap: 15 (ref 5–15)
BUN: 11 mg/dL (ref 8–23)
CHLORIDE: 99 mmol/L (ref 98–111)
CO2: 20 mmol/L — AB (ref 22–32)
Calcium: 8.7 mg/dL — ABNORMAL LOW (ref 8.9–10.3)
Creatinine, Ser: 0.74 mg/dL (ref 0.61–1.24)
GFR calc Af Amer: >60 mL/min (ref >60)
GFR calc non Af Amer: >60 mL/min (ref >60)
Glucose, Bld: 116 mg/dL — ABNORMAL HIGH (ref 70–99)
Potassium: 3.6 mmol/L (ref 3.5–5.1)
Sodium: 134 mmol/L — ABNORMAL LOW (ref 135–145)

## 2018-03-14 LAB — TROPONIN I
Troponin I: 0.15 ng/mL (ref <0.03)
Troponin I: 0.1 ng/mL (ref <0.03)

## 2018-03-14 LAB — BRAIN NATRIURETIC PEPTIDE: B Natriuretic Peptide: 702.9 pg/mL — ABNORMAL HIGH (ref 0.0–100.0)

## 2018-03-14 LAB — AMMONIA: Ammonia: 21 umol/L (ref 9–35)

## 2018-03-14 MED ORDER — LORAZEPAM 2 MG/ML IJ SOLN
INTRAMUSCULAR | Status: AC
  Filled 2018-03-14: qty 1

## 2018-03-14 MED ORDER — LORAZEPAM 2 MG/ML IJ SOLN
INTRAMUSCULAR | Status: AC
  Administered 2018-03-14: 2 mg
  Filled 2018-03-14: qty 2

## 2018-03-14 MED ORDER — POTASSIUM CHLORIDE IN NACL 20-0.9 MEQ/L-% IV SOLN
INTRAVENOUS | Status: DC
  Administered 2018-03-14 – 2018-03-15 (×3): via INTRAVENOUS
  Filled 2018-03-14 (×3): qty 1000

## 2018-03-14 MED ORDER — LACTATED RINGERS IV BOLUS
1000.0000 mL | Freq: Once | INTRAVENOUS | Status: AC
  Administered 2018-03-14: 1000 mL via INTRAVENOUS

## 2018-03-14 MED ORDER — LORAZEPAM 2 MG/ML IJ SOLN: 2.0000 mg | Freq: Once | INTRAMUSCULAR | Status: AC

## 2018-03-14 MED ORDER — LEVETIRACETAM IN NACL 1000 MG/100ML IV SOLN
1000.0000 mg | Freq: Once | INTRAVENOUS | Status: AC
  Administered 2018-03-14: 1000 mg via INTRAVENOUS
  Filled 2018-03-14: qty 100

## 2018-03-14 MED ORDER — LEVETIRACETAM IN NACL 1500 MG/100ML IV SOLN
1500.0000 mg | Freq: Two times a day (BID) | INTRAVENOUS | Status: DC
  Administered 2018-03-14 – 2018-03-15 (×3): 1500 mg via INTRAVENOUS
  Filled 2018-03-14 (×3): qty 100

## 2018-03-14 NOTE — Evaluation (Signed)
Speech Language Pathology Evaluation Patient Details Name: Alvin Hudson MRN: [id number] DOB: 1935/10/10 Today's Date: 03/14/2018 Time: 1245-8099 SLP Time Calculation (min) (ACUTE ONLY): 5 min  Problem List:  Patient Active Problem List   Diagnosis Date Noted  . Aphasia 03/12/2018  . Seizure (HCC) 03/12/2018  . Subdural hematoma (HCC) 03/10/2018   Past Medical History:  Past Medical History:  Diagnosis Date  . Allergy   . Seizures (HCC)    Past Surgical History:  Past Surgical History:  Procedure Laterality Date  . CRANIOTOMY Left 03/10/2018   Procedure: CRANIOTOMY HEMATOMA EVACUATION SUBDURAL;  Surgeon: Tressie Stalker, MD;  Location: [facility] OR;  Service: Neurosurgery;  Laterality: Left;  . INGUINAL HERNIA REPAIR Bilateral    HPI:  The patient is an 83 year old white male who underwent craniotomy 2/14 following large acute left subdural hematoma and discharged 2/16. Presented to ED evening of 2/16 due to difficulty with speech and moving right side. MD suspected he had a prolonged Todd's paralysis after a seisure. CXR Cardiomegaly. No acute cardiopulmonary abnormality.   Assessment / Plan / Recommendation Clinical Impression  Pt exhibits global aphasia and moderate cognitive impairments. He was restless and had difficulty sustaining attention to therapist. He did not follow simple commands given a verbal and visual model. No initiation of phonation during requests to imitate and no spontaneous vocalizations present this assessment. RN has noted unintelligible verbalizations at bedside. ST will continue treatment to increase communication and cognitive function and recommend inpatient rehab setting.        SLP Assessment  SLP Visit Diagnosis: Dysphagia, unspecified (R13.10)    Follow Up Recommendations  Other (comment)(TBD)    Frequency and Duration min 2x/week         SLP Evaluation Cognition  Overall Cognitive Status: Impaired/Different from baseline Arousal/Alertness:  Awake/alert Orientation Level: Other (comment)(no response via gestures to yes/no) Attention: Sustained Sustained Attention: Impaired Sustained Attention Impairment: Verbal basic;Functional basic Memory: (TBA) Awareness: Impaired Awareness Impairment: Emergent impairment Problem Solving: Impaired Problem Solving Impairment: Functional basic Behaviors: Restless Safety/Judgment: Impaired       Comprehension  Auditory Comprehension Overall Auditory Comprehension: Impaired Yes/No Questions: Impaired Basic Biographical Questions: Other (comment)(no response) Commands: Impaired One Step Basic Commands: 0-24% accurate(no response) Interfering Components: Attention Visual Recognition/Discrimination Discrimination: Not tested Reading Comprehension Reading Status: (TBA)    Expression Expression Primary Mode of Expression: Other (comment)(no attempts) Verbal Expression Overall Verbal Expression: Impaired Initiation: Impaired Level of Generative/Spontaneous Verbalization: Word Repetition: (no response) Naming: Not tested Pragmatics: Impairment Impairments: Abnormal affect;Eye contact Interfering Components: Attention Written Expression Dominant Hand: Right Written Expression: (TBA)   Oral / Motor  Oral Motor/Sensory Function Overall Oral Motor/Sensory Function: Moderate impairment Facial ROM: Reduced right;Suspected CN VII (facial) dysfunction Facial Symmetry: Abnormal symmetry right;Suspected CN VII (facial) dysfunction Facial Strength: Reduced right;Suspected CN VII (facial) dysfunction Motor Speech Overall Motor Speech: Other (comment)(no phonation) Intelligibility: Unable to assess (comment)   GO                    [name] 03/14/2018, 9:44 AM  [name] [name] Nurse, children's [phone] Office [phone]

## 2018-03-14 NOTE — Progress Notes (Signed)
LTM discontinued; no skin breakdown was seen. 

## 2018-03-14 NOTE — Progress Notes (Signed)
Subjective: The patient is alert and in no apparent distress.  His wife is at the bedside.  Objective: Vital signs in last 24 hours: Temp:  [98.9 F (37.2 C)-100.3 F (37.9 C)] 99.1 F (37.3 C) (02/18 0400) Pulse Rate:  [70-119] 82 (02/18 0700) Resp:  [8-30] 8 (02/18 0700) BP: (86-150)/(52-100) 86/58 (02/18 0700) SpO2:  [91 %-100 %] 94 % (02/18 0700) Estimated body mass index is 21.9 kg/m as calculated from the following:   Height as of 03/10/18: 5\' 11"  (1.803 m).   Weight as of 03/10/18: 71.2 kg.   Intake/Output from previous day: 02/17 0701 - 02/18 0700 In: 1094.1 [I.V.:878.1; IV Piggyback:216] Out: 750 [Urine:750] Intake/Output this shift: No intake/output data recorded.  Physical exam the patient is alert.  He is right hemiparetic.  He is a phasic.  He follows commands on the left.  His wound is healing well.  I have reviewed the patient's brain MRI form yesterday.  He has a tiny left frontal residual or recurrent subdural hematoma without significant mass-effect.  There is no evidence of stroke.  He has some MCA stenosis. Lab Results: Recent Labs    03/12/18 2001 03/14/18 0422  WBC 13.8* 12.9*  HGB 12.0* 11.0*  HCT 36.4* 33.0*  PLT 252 240   BMET Recent Labs    03/12/18 2001 03/14/18 0422  NA 131* 134*  K 3.7 3.6  CL 97* 99  CO2 26 20*  GLUCOSE 138* 116*  BUN 8 11  CREATININE 0.75 0.74  CALCIUM 9.0 8.7*    Studies/Results: Ct Head Wo Contrast  Result Date: 03/12/2018 CLINICAL DATA:  Possible stroke, right-sided weakness with incomprehensible speech. History of seizures. EXAM: CT HEAD WITHOUT CONTRAST TECHNIQUE: Contiguous axial images were obtained from the base of the skull through the vertex without intravenous contrast. COMPARISON:  03/10/2018 FINDINGS: Brain: Redistribution of postop blood products over the left frontal and parietal convexities current exam. There is redemonstration of a small amount postoperative air overlying the left cerebral  convexity. No significant change in the appearance of brain is otherwise noted. No significant mass effect or midline shift. Chronic small vessel ischemic disease of periventricular white matter. No herniation. Midline ventricle basal cisterns without effacement. Brainstem and cerebellum are nonacute. Vascular: Atherosclerosis of the carotid siphons. No hyperdense vessel sign. Skull: Postop change from left lateral craniotomy with overlying scalp soft tissue emphysema and residual blood products with overlying skin staples. Sinuses/Orbits: Intact orbits and globes. Clear paranasal sinuses. Other: Left periorbital and malar soft tissue swelling. IMPRESSION: 1. Redistribution of postop blood products over the left frontal and parietal convexities on current exam. No significant mass effect or midline shift. No acute intracranial appearing abnormality. 2. Postop change from left lateral craniotomy with overlying scalp soft tissue emphysema and residual blood products. 3. Left periorbital and malar soft tissue swelling. Electronically Signed   By: Tollie Eth M.D.   On: 03/12/2018 20:52   Mr Maxine Glenn Head Wo Contrast  Result Date: 03/13/2018 CLINICAL DATA:  Altered mental status EXAM: MRI HEAD WITHOUT CONTRAST MRA HEAD WITHOUT CONTRAST TECHNIQUE: Multiplanar, multiecho pulse sequences of the brain and surrounding structures were obtained without intravenous contrast. Angiographic images of the head were obtained using MRA technique without contrast. COMPARISON:  Head CT 03/12/2018 FINDINGS: MRI HEAD FINDINGS BRAIN: There are extra-axial blood products over the left convexity. Multiple tiny foci of cortical diffusion weighted abnormality likely due to the presence of nearby blood. No acute infarct is identified. The left convexity collection measures up to  7 mm in thickness. The midline structures are normal. There are no old infarcts. The white matter signal is normal for the patient's age. Generalized atrophy without  lobar predilection. Susceptibility-sensitive sequences show no chronic microhemorrhage or superficial siderosis. SKULL AND UPPER CERVICAL SPINE: Status post left pterional craniotomy. SINUSES/ORBITS: No fluid levels or advanced mucosal thickening. No mastoid or middle ear effusion. The orbits are normal. MRA HEAD FINDINGS POSTERIOR CIRCULATION: --Basilar artery: Normal. --Posterior cerebral arteries: Normal. Both originate from the basilar artery. --Superior cerebellar arteries: Normal. --Inferior cerebellar arteries: Normal anterior and posterior inferior cerebellar arteries. ANTERIOR CIRCULATION: --Intracranial internal carotid arteries: Normal. --Anterior cerebral arteries: Normal. Both A1 segments are present. Patent anterior communicating artery. --Middle cerebral arteries: Multifocal moderate-to-severe atherosclerotic narrowing of the M2 branches. Unremarkable right MCA. --Posterior communicating arteries: Absent bilaterally. IMPRESSION: 1. 7 mm extra-axial hematoma over the left convexity, as previously demonstrated on earlier head CT. No new site of hemorrhage. No acute ischemia. 2. 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 No intracranial large vessel occlusion. Electronically Signed   By: Deatra Robinson M.D.   On: 03/13/2018 17:48   Mr Laqueta Jean NW Contrast  Result Date: 03/13/2018 CLINICAL DATA:  Altered mental status EXAM: MRI HEAD WITHOUT CONTRAST MRA HEAD WITHOUT CONTRAST TECHNIQUE: Multiplanar, multiecho pulse sequences of the brain and surrounding structures were obtained without intravenous contrast. Angiographic images of the head were obtained using MRA technique without contrast. COMPARISON:  Head CT 03/12/2018 FINDINGS: MRI HEAD FINDINGS BRAIN: There are extra-axial blood products over the left convexity. Multiple tiny foci of cortical diffusion weighted abnormality likely due to the presence of nearby blood. No acute infarct is identified. The left  convexity collection measures up to 7 mm in thickness. The midline structures are normal. There are no old infarcts. The white matter signal is normal for the patient's age. Generalized atrophy without lobar predilection. Susceptibility-sensitive sequences show no chronic microhemorrhage or superficial siderosis. SKULL AND UPPER CERVICAL SPINE: Status post left pterional craniotomy. SINUSES/ORBITS: No fluid levels or advanced mucosal thickening. No mastoid or middle ear effusion. The orbits are normal. MRA HEAD FINDINGS POSTERIOR CIRCULATION: --Basilar artery: Normal. --Posterior cerebral arteries: Normal. Both originate from the basilar artery. --Superior cerebellar arteries: Normal. --Inferior cerebellar arteries: Normal anterior and posterior inferior cerebellar arteries. ANTERIOR CIRCULATION: --Intracranial internal carotid arteries: Normal. --Anterior cerebral arteries: Normal. Both A1 segments are present. Patent anterior communicating artery. --Middle cerebral arteries: Multifocal moderate-to-severe atherosclerotic narrowing of the M2 branches. Unremarkable right MCA. --Posterior communicating arteries: Absent bilaterally. IMPRESSION: 1. 7 mm extra-axial hematoma over the left convexity, as previously demonstrated on earlier head CT. No new site of hemorrhage. No acute ischemia. 2. 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 No intracranial large vessel occlusion. Electronically Signed   By: Deatra Robinson M.D.   On: 03/13/2018 17:48   Dg Chest Port 1 View  Result Date: 03/14/2018 CLINICAL DATA:  83 year old male with fever for 1 day. Altered mental status. EXAM: PORTABLE CHEST 1 VIEW COMPARISON:  No prior chest imaging. FINDINGS: Portable AP supine view at 2129 hours. Cardiomegaly. Tortuous thoracic aorta. Other mediastinal contours are within normal limits. Visualized tracheal air column is within normal limits. Allowing for portable technique the lungs are  clear. No pneumothorax or pleural effusion is evident. No acute osseous abnormality identified. IMPRESSION: Cardiomegaly. No acute cardiopulmonary abnormality. Electronically Signed   By: Odessa Fleming M.D.   On: 03/14/2018 00:55    Assessment/Plan: Right hemiparasis, a aphasia:  The patient's postoperative head CTs and brain MRI look good except for some stenosis of the left MCA.  I do not know why he would have vasospasm as he did not have a significant subarachnoid hemorrhage.  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  We will increase his IV fluids and continue supportive care.  I have answered all the patient's wife's questions.  LOS: 2 days     Cristi LoronJeffrey D Ayriel Texidor 03/14/2018, 8:06 AM

## 2018-03-14 NOTE — Consult Note (Addendum)
The patient has been seen in conjunction with Marjie Skiff, PAC. All aspects of care have been considered and discussed. The patient has been personally interviewed, examined, and all clinical data has been reviewed.   Clinical data has been reviewed.  Paroxysmal atrial fibrillation in the setting of stress.  Patient also has left bundle branch block.  The patient suffered intracranial injury related to syncope/fall.  He has a systolic murmur compatible with aortic stenosis.  Plan to continue diltiazem intravenously until oral therapy can be started.  This will help to control rate if recurrent AF occurs.  Not a candidate for anticoagulation due to intracranial injury of recent surgery.  Timing of therapy if ever will depend upon recommendations from neurosurgery and neurology.  2D Doppler echocardiogram to assess severity of aortic stenosis.  We need a 30-day monitor to exclude excessive bradycardia as a possible explanation for syncope that led to injury.  Cardiology Consult    Patient ID: Alvin Hudson MRN: [id number], DOB/AGE: [birth year]   Admit date: 03/12/2018 Date of Consult: 03/14/2018  Primary Physician: Cain Sieve, MD Primary Cardiologist: New to [facility]  Requesting Provider: Tressie Stalker, MD  Patient Profile    Alvin Hudson is a 83 y.o. male with a history of seizures on Keppra and known LBBB but no known cardiac history who is being seen today for the evaluation of atrial fibrillation and elevated troponin at the request of Dr. Lovell Sheehan.  History of Present Illness    Alvin Hudson is a 83 year old male with a history of seizures on Keppra who presented to the [facility] ED on 03/01/2018 after syncopal episode. Per chart review, felt dizzy and passed out after getting out of his car at the grocery store and hit his head on the way down. When he came to, patient noticed headache in bilateral temples. Head CT showed no acute intracranial abnormality and patient was discharged  in stable condition. Patient retorned to the ED on 03/10/2018 after developing some slurred speech. Head CT showed a 1 to 1.5 cm acute subdural hematoma of the left temporal region with with mass effect with 3 mm of midline shift toward the right. Patient was transferred to [facility] for neurosurgical evaluation and potential intervention. He underwent a craniotomy for evacuation of the subdural hematoma and tolerated the procedure well. He was discharged on 03/12/2018. At home he took a nap and upon waking he was found to have altered mental status, global aphasia, right arm weakness, and visual hallucination. He returned to the ED where CT showed no acute changes and he was admitted with concerns for seizure. 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   Patient was noted to be in atrial fibrillation with RVR yesterday with maximal heart rates in the 160's. Patient was started on IV Cardizem with improvement of heart rates. Cardiology was consulted today for atrial fibrillation and elevated troponin. Troponin from 02/28/2018 minimally elevated at 0.04. Troponin yesterday 0.15. EKG showed atrial fibrillation with ventricular rate of 104 bpm with ST depression noted in lateral leads. Patient currently aphasic so unable to get history or ROS from him. Per chart review in Care Everywhere, it does look like he has had syncopal episodes before. Mos recent Echo in 06/2017 at [facility] showed LVEF of 50-55% with grade 1 diastolic dysfunction. Spoke with patient's wife and daughters. Patient has a history a aortic stenosis but has no other known cardiac history. He does not see  a Development worker, international aid and has never had an ischemic workup. Patient has never been told he has an irregular heart rhythm before. Wife reports patient has had a few episodes of dizziness before with position changes. Most recent  but states he has never complained of any chest pain.   Past Medical History    Past Medical History:  Diagnosis Date  . Allergy   . Seizures (HCC)     Past Surgical History:  Procedure Laterality Date  . CRANIOTOMY Left 03/10/2018   Procedure: CRANIOTOMY HEMATOMA EVACUATION SUBDURAL;  Surgeon: Tressie Stalker, MD;  Location: [facility] OR;  Service: Neurosurgery;  Laterality: Left;  . INGUINAL HERNIA REPAIR Bilateral      Allergies  Allergies  Allergen Reactions  . Aspirin Other (See Comments)    Caused ulcers  . Naproxen Other (See Comments)    Caused ulcers    Inpatient Medications    . atorvastatin  20 mg Oral Daily  . B-complex with vitamin C  1 tablet Oral Daily  . chlorhexidine  15 mL Mouth Rinse BID  . docusate sodium  100 mg Oral BID  . mouth rinse  15 mL Mouth Rinse q12n4p  . polycarbophil  625 mg Oral Daily  . sertraline  100 mg Oral Daily  . temazepam  15 mg Oral QHS    Family History    History reviewed. No pertinent family history. has no family status information on file.   Patient is aphasic so unable to gather family history.  Social History    Social History   Socioeconomic History  . Marital status: Married    Spouse name: Not on file  . Number of children: Not on file  . Years of education: Not on file  . Highest education level: Not on file  Occupational History  . Not on file  Social Needs  . Financial resource strain: Not on file  . Food insecurity:    Worry: Not on file    Inability: Not on file  . Transportation needs:    Medical: Not on file    Non-medical: Not on file  Tobacco Use  . Smoking status: Never Smoker  . Smokeless tobacco: Never Used  Substance and Sexual Activity  . Alcohol use: Yes    Alcohol/week: 14.0 standard drinks    Types: 14 Standard drinks or equivalent per week    Comment: 2 martinis/ night per pt  . Drug use: No  . Sexual activity: Not on file  Lifestyle  . Physical activity:    Days per week: Not on file    Minutes per session: Not on file  . Stress: Not on file   Relationships  . Social connections:    Talks on phone: Not on file    Gets together: Not on file    Attends religious service: Not on file    Active member of club or organization: Not on file    Attends meetings of clubs or organizations: Not on file    Relationship status: Not on file  . Intimate partner violence:    Fear of current or ex partner: Not on file    Emotionally abused: Not on file    Physically abused: Not on file    Forced sexual activity: Not on file  Other Topics Concern  . Not on file  Social History Narrative  . Not on file     Review of Systems    Review of Systems  Reason unable to perform ROS: aphasic.  Physical Exam    Blood pressure 117/78, pulse 91, temperature 98.9 F (37.2 C), temperature source Axillary, resp. rate 14, SpO2 95 %.  General: 83 y.o. Caucasian male resting comfortably in no acute distress. Pleasant and cooperative. HEENT: Bandages on left head from craniotomy. Sclera clear.   Neck: Supple. No carotid bruits or JVD appreciated. Lungs: No increased work of breathing. Clear to auscultation bilaterally. No wheezes, rhonchi, or rales. Heart: RRR. Distinct S1 and S2. III/VI systolic murmur heard throughout. No gallops or rubs.  Abdomen: Soft, non-distended, and non-tender to palpation. Bowel sounds present.   Extremities: No lower extremity edema. Radial pulses and distal pedal pulses 2+ and equal bilaterally. Skin: Warm and dry. Neuro: Awake and alert. Aphasic. Right sided facial droop. Right arm flaccid. Moves all other extremities spontaneously.  Labs    Troponin (Point of Care Test) No results for input(s): TROPIPOC in the last 72 hours. Recent Labs    03/14/18 0435  TROPONINI 0.15*   Lab Results  Component Value Date   WBC 12.9 (H) 03/14/2018   HGB 11.0 (L) 03/14/2018   HCT 33.0 (L) 03/14/2018   MCV 103.8 (H) 03/14/2018   PLT 240 03/14/2018    Recent Labs  Lab 03/12/18 2001 03/14/18 0422  NA 131* 134*  K 3.7  3.6  CL 97* 99  CO2 26 20*  BUN 8 11  CREATININE 0.75 0.74  CALCIUM 9.0 8.7*  PROT 6.3*  --   BILITOT 1.8*  --   ALKPHOS 49  --   ALT 32  --   AST 40  --   GLUCOSE 138* 116*   No results found for: CHOL, HDL, LDLCALC, TRIG No results found for: [facility]   Radiology Studies    Ct Head Wo Contrast  Result Date: 03/12/2018 CLINICAL DATA:  Possible stroke, right-sided weakness with incomprehensible speech. History of seizures. EXAM: CT HEAD WITHOUT CONTRAST TECHNIQUE: Contiguous axial images were obtained from the base of the skull through the vertex without intravenous contrast. COMPARISON:  03/10/2018 FINDINGS: Brain: Redistribution of postop blood products over the left frontal and parietal convexities current exam. There is redemonstration of a small amount postoperative air overlying the left cerebral convexity. No significant change in the appearance of brain is otherwise noted. No significant mass effect or midline shift. Chronic small vessel ischemic disease of periventricular white matter. No herniation. Midline ventricle basal cisterns without effacement. Brainstem and cerebellum are nonacute. Vascular: Atherosclerosis of the carotid siphons. No hyperdense vessel sign. Skull: Postop change from left lateral craniotomy with overlying scalp soft tissue emphysema and residual blood products with overlying skin staples. Sinuses/Orbits: Intact orbits and globes. Clear paranasal sinuses. Other: Left periorbital and malar soft tissue swelling. IMPRESSION: 1. Redistribution of postop blood products over the left frontal and parietal convexities on current exam. No significant mass effect or midline shift. No acute intracranial appearing abnormality. 2. Postop change from left lateral craniotomy with overlying scalp soft tissue emphysema and residual blood products. 3. Left periorbital and malar soft tissue swelling. Electronically Signed   By: Tollie Ethavid  Kwon M.D.   On: 03/12/2018 20:52   Ct Head Wo  Contrast  Result Date: 03/10/2018 CLINICAL DATA:  83 y/o  M; subdural hemorrhage for follow-up. EXAM: CT HEAD WITHOUT CONTRAST TECHNIQUE: Contiguous axial images were obtained from the base of the skull through the vertex without intravenous contrast. COMPARISON:  03/10/2018 CT head. FINDINGS: Brain: Interval left lateral craniotomy for evacuation of subdural hematoma. There is a small postoperative air and  fluid-filled collection overlying the left cerebral convexity with minimal residual blood products. There is an interval decrease of mass effect with improved patency of the left lateral ventricle and near complete resolution of left to right midline shift. No new intracranial hemorrhage, stroke, mass lesion, hydrocephalus, or herniation is identified. Vascular: Calcific atherosclerosis of carotid siphons. No hyperdense vessel. Skull: Postsurgical changes related to a left lateral craniotomy with air and edema in the overlying scalp and a scalp drain noted. Sinuses/Orbits: Mild mucosal thickening of the maxillary sinuses and the left posterior ethmoid air cells. Normal aeration of the mastoid air cells. Orbits are unremarkable. Other: None. IMPRESSION: 1. Interval left lateral craniotomy with expected postsurgical changes. 2. Small postoperative air and fluid-filled collection overlying the left cerebral convexity with minimal residual blood products. 3. Decreased mass effect with improved patency of the left lateral ventricle and near complete resolution of left to right midline shift. 4. No new intracranial abnormality identified. Electronically Signed   By: Mitzi Hansen M.D.   On: 03/10/2018 22:00   Ct Head Wo Contrast  Result Date: 03/10/2018 CLINICAL DATA:  Aphasic.  Recent fall EXAM: CT HEAD WITHOUT CONTRAST TECHNIQUE: Contiguous axial images were obtained from the base of the skull through the vertex without intravenous contrast. COMPARISON:  March 01, 2018 FINDINGS: Brain: There is an  acute left-sided subdural hematoma with involvement primarily in the left temporal region but also to a lesser extent in the left frontal region. There is a maximum thickness of this subdural hematoma 1.5 cm. There is mass effect on the superior left temporal lobe and posterior most aspect of the left frontal lobe. There is 3 mm of midline shift to the right. There is mild underlying atrophy. No intra-axial mass or intra-axial hemorrhage noted. No other extra-axial fluid collections are identified. There is slight small vessel disease in the centra semiovale bilaterally. No evident acute infarct. Vascular: No hyperdense vessels. There is calcification in each carotid siphon region. Skull: There is a subtle linear fracture in the left temporal bone. Bony calvarium elsewhere appears intact. Sinuses/Orbits: There is mucosal thickening in several ethmoid air cells. Other visualized paranasal sinuses are clear. Visualized orbits appear symmetric bilaterally. Other: Mastoid air cells are clear. IMPRESSION: Left-sided subdural hematoma, acute, with a maximum thickness of 1.5 cm. This subdural hematoma primarily involves the left temporal region but also involves the posterior left frontal region. There is mass effect on the left temporal lobe laterally with 3 mm of midline shift toward the right. No intra-axial hemorrhage or mass. Slight periventricular small vessel disease. Nondisplaced fracture left temporal bone. Mucosal thickening in several ethmoid air cells. Critical Value/emergent results were called by telephone at the time of interpretation on 03/10/2018 at 7:48 am to the covering ED department physician, who verbally acknowledged these results. Electronically Signed   By: Bretta Bang III M.D.   On: 03/10/2018 07:49   Ct Head Wo Contrast  Result Date: 03/01/2018 CLINICAL DATA:  Fall.  Headache EXAM: CT HEAD WITHOUT CONTRAST CT CERVICAL SPINE WITHOUT CONTRAST TECHNIQUE: Multidetector CT imaging of the head  and cervical spine was performed following the standard protocol without intravenous contrast. Multiplanar CT image reconstructions of the cervical spine were also generated. COMPARISON:  None. FINDINGS: CT HEAD FINDINGS Brain: Generalized atrophy without hydrocephalus. Negative for acute infarct, hemorrhage, or mass lesion. Vascular: Negative for hyperdense vessel Skull: Negative foot Sinuses/Orbits: Negative Other: None CT CERVICAL SPINE FINDINGS Alignment: Mild anterolisthesis C2-3. Mild retrolisthesis C3-4, C4-5, C5-6, and C6-7. Skull base  and vertebrae: Negative for fracture Soft tissues and spinal canal: Negative Disc levels: Advanced disc degeneration and spurring C3 through C7. Multilevel spinal and foraminal stenosis due to spurring. Upper chest: Negative Other: None IMPRESSION: 1. No acute intracranial abnormality 2. Advanced cervical spondylosis.  Negative for fracture. Electronically Signed   By: Marlan Palau M.D.   On: 03/01/2018 16:33   Ct Cervical Spine Wo Contrast  Result Date: 03/01/2018 CLINICAL DATA:  Fall.  Headache EXAM: CT HEAD WITHOUT CONTRAST CT CERVICAL SPINE WITHOUT CONTRAST TECHNIQUE: Multidetector CT imaging of the head and cervical spine was performed following the standard protocol without intravenous contrast. Multiplanar CT image reconstructions of the cervical spine were also generated. COMPARISON:  None. FINDINGS: CT HEAD FINDINGS Brain: Generalized atrophy without hydrocephalus. Negative for acute infarct, hemorrhage, or mass lesion. Vascular: Negative for hyperdense vessel Skull: Negative foot Sinuses/Orbits: Negative Other: None CT CERVICAL SPINE FINDINGS Alignment: Mild anterolisthesis C2-3. Mild retrolisthesis C3-4, C4-5, C5-6, and C6-7. Skull base and vertebrae: Negative for fracture Soft tissues and spinal canal: Negative Disc levels: Advanced disc degeneration and spurring C3 through C7. Multilevel spinal and foraminal stenosis due to spurring. Upper chest: Negative  Other: None IMPRESSION: 1. No acute intracranial abnormality 2. Advanced cervical spondylosis.  Negative for fracture. Electronically Signed   By: Marlan Palau M.D.   On: 03/01/2018 16:33   Mr Maxine Glenn Head Wo Contrast  Result Date: 03/13/2018 CLINICAL DATA:  Altered mental status EXAM: MRI HEAD WITHOUT CONTRAST MRA HEAD WITHOUT CONTRAST TECHNIQUE: Multiplanar, multiecho pulse sequences of the brain and surrounding structures were obtained without intravenous contrast. Angiographic images of the head were obtained using MRA technique without contrast. COMPARISON:  Head CT 03/12/2018 FINDINGS: MRI HEAD FINDINGS BRAIN: There are extra-axial blood products over the left convexity. Multiple tiny foci of cortical diffusion weighted abnormality likely due to the presence of nearby blood. No acute infarct is identified. The left convexity collection measures up to 7 mm in thickness. The midline structures are normal. There are no old infarcts. The white matter signal is normal for the patient's age. Generalized atrophy without lobar predilection. Susceptibility-sensitive sequences show no chronic microhemorrhage or superficial siderosis. SKULL AND UPPER CERVICAL SPINE: Status post left pterional craniotomy. SINUSES/ORBITS: No fluid levels or advanced mucosal thickening. No mastoid or middle ear effusion. The orbits are normal. MRA HEAD FINDINGS POSTERIOR CIRCULATION: --Basilar artery: Normal. --Posterior cerebral arteries: Normal. Both originate from the basilar artery. --Superior cerebellar arteries: Normal. --Inferior cerebellar arteries: Normal anterior and posterior inferior cerebellar arteries. ANTERIOR CIRCULATION: --Intracranial internal carotid arteries: Normal. --Anterior cerebral arteries: Normal. Both A1 segments are present. Patent anterior communicating artery. --Middle cerebral arteries: Multifocal moderate-to-severe atherosclerotic narrowing of the M2 branches. Unremarkable right MCA. --Posterior  communicating arteries: Absent bilaterally. IMPRESSION: 1. 7 mm extra-axial hematoma over the left convexity, as previously demonstrated on earlier head CT. No new site of hemorrhage. No acute ischemia. 2. 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 No intracranial large vessel occlusion. Electronically Signed   By: Deatra Robinson M.D.   On: 03/13/2018 17:48   Mr Laqueta Jean BX Contrast  Result Date: 03/13/2018 CLINICAL DATA:  Altered mental status EXAM: MRI HEAD WITHOUT CONTRAST MRA HEAD WITHOUT CONTRAST TECHNIQUE: Multiplanar, multiecho pulse sequences of the brain and surrounding structures were obtained without intravenous contrast. Angiographic images of the head were obtained using MRA technique without contrast. COMPARISON:  Head CT 03/12/2018 FINDINGS: MRI HEAD FINDINGS BRAIN: There are extra-axial blood products over the left convexity. Multiple  tiny foci of cortical diffusion weighted abnormality likely due to the presence of nearby blood. No acute infarct is identified. The left convexity collection measures up to 7 mm in thickness. The midline structures are normal. There are no old infarcts. The white matter signal is normal for the patient's age. Generalized atrophy without lobar predilection. Susceptibility-sensitive sequences show no chronic microhemorrhage or superficial siderosis. SKULL AND UPPER CERVICAL SPINE: Status post left pterional craniotomy. SINUSES/ORBITS: No fluid levels or advanced mucosal thickening. No mastoid or middle ear effusion. The orbits are normal. MRA HEAD FINDINGS POSTERIOR CIRCULATION: --Basilar artery: Normal. --Posterior cerebral arteries: Normal. Both originate from the basilar artery. --Superior cerebellar arteries: Normal. --Inferior cerebellar arteries: Normal anterior and posterior inferior cerebellar arteries. ANTERIOR CIRCULATION: --Intracranial internal carotid arteries: Normal. --Anterior cerebral arteries: Normal. Both  A1 segments are present. Patent anterior communicating artery. --Middle cerebral arteries: Multifocal moderate-to-severe atherosclerotic narrowing of the M2 branches. Unremarkable right MCA. --Posterior communicating arteries: Absent bilaterally. IMPRESSION: 1. 7 mm extra-axial hematoma over the left convexity, as previously demonstrated on earlier head CT. No new site of hemorrhage. No acute ischemia. 2. 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 No intracranial large vessel occlusion. Electronically Signed   By: Deatra Robinson M.D.   On: 03/13/2018 17:48   Dg Chest Port 1 View  Result Date: 03/14/2018 CLINICAL DATA:  83 year old male with fever for 1 day. Altered mental status. EXAM: PORTABLE CHEST 1 VIEW COMPARISON:  No prior chest imaging. FINDINGS: Portable AP supine view at 2129 hours. Cardiomegaly. Tortuous thoracic aorta. Other mediastinal contours are within normal limits. Visualized tracheal air column is within normal limits. Allowing for portable technique the lungs are clear. No pneumothorax or pleural effusion is evident. No acute osseous abnormality identified. IMPRESSION: Cardiomegaly. No acute cardiopulmonary abnormality. Electronically Signed   By: Odessa Fleming M.D.   On: 03/14/2018 00:55    EKG     EKG: EKG was personally reviewed and demonstrates: Atrial fibrillation with ventricular rate of 104 bpm with some ST depression noted in lateral leads but in the setting of known LBBB.  Telemetry: Telemetry was personally reviewed and demonstrates: Currently in sinus rhythm vs. wandering atrial pacemaker with episodes of atrial fibrillation with ventricular rates as high as the 140's.   Cardiac Imaging    Echocardiogram 07/21/2017 [facility]):  Left ventricular hypertrophy - moderate  Normal left ventricular systolic function, ejection fraction 50 - 55%  Diastolic dysfunction - grade I (normal filling pressures)  Dilated left atrium -  mild  Aortic sclerosis-cannot exclude aortic stenosis please see detailed  below  Normal right ventricular systolic function  Dilated right ventricle - mild  Dilated right atrium - mild  Assessment & Plan    Atrial Fibrillation with RVR - Patient noted to be in atrial fibrillation with RVR yesterday.  - EKG from this morning showed atrial fibrillation with ventricular rate of 104 bpm with some ST depression in lateral leads but in the setting of known LBBB. - Currently in sinus rhythm vs. wandering atrial pacemaker on telemetry with heart rates well controlled on IV Cardizem. - Will check Echo. - Continue IV Cardizem. When patient passes swallow study, can switch to PO medications. - CHA2DS2-VASc = 2 (age). Given recent acute subdural hematoma, will not start anticoagulation. Will defer to MD and Neurology. - Prior to discharge, will set up 30 day event monitor given patient appears to have had more than one syncopal episode.   Elevated Troponin - Troponin  minimally elevated at 0.04 (on 03/10/2018) and 0.15 (today). Will check one more troponin. Likely demand ischemia in the setting of atrial fibrillation with RVR. - Patient has no known cardiac history and has not had any chest pain. - Will check Echo as above.  Aortic Stenosis - Patient reportedly has a history of aortic stenosis.  - Most [facility] reports "aortic stenosis - cannot exclude aortic stenosis." - Will check Echo as above.    Signed, Corrin Parkerallie E Goodrich, PA-C 03/14/2018, 1:34 PM  For questions or updates, please contact   Please consult [email or web address] for contact info under Cardiology/STEMI.

## 2018-03-14 NOTE — Progress Notes (Signed)
LTM EEG checked, no skin breakdown noted. Cz was reapplied

## 2018-03-14 NOTE — Plan of Care (Signed)
Called by RN that pt had two episode of right gaze and right facial twitching with right arm rhythmic shaking, lasting 30-45 sec. Very short post ictal state. Given 2mg  ativan at the 2nd episode. Will give 1g keppra load and increase keppra maintenance dose to 1500mg  bid. Will put LTM EEG back on. EEG lab notified.   Marvel Plan, MD PhD Stroke Neurology 03/14/2018 5:04 PM

## 2018-03-14 NOTE — Evaluation (Signed)
Clinical/Bedside Swallow Evaluation Patient Details  Name: Alvin Hudson MRN: [id number] Date of Birth: Jun 22, 1935  Today's Date: 03/14/2018 Time: SLP Start Time (ACUTE ONLY): 5916 SLP Stop Time (ACUTE ONLY): 3846 SLP Time Calculation (min) (ACUTE ONLY): 11 min  Past Medical History:  Past Medical History:  Diagnosis Date  . Allergy   . Seizures (HCC)    Past Surgical History:  Past Surgical History:  Procedure Laterality Date  . CRANIOTOMY Left 03/10/2018   Procedure: CRANIOTOMY HEMATOMA EVACUATION SUBDURAL;  Surgeon: Tressie Stalker, MD;  Location: [facility] OR;  Service: Neurosurgery;  Laterality: Left;  . INGUINAL HERNIA REPAIR Bilateral    HPI:  The patient is an 83 year old white male who underwent craniotomy 2/14 following large acute left subdural hematoma and discharged 2/16. Presented to ED evening of 2/16 due to difficulty with speech and moving right side. MD suspected he had a prolonged Todd's paralysis after a seisure. CXR Cardiomegaly. No acute cardiopulmonary abnormality.   Assessment / Plan / Recommendation Clinical Impression  Pt demonstrated CN VII involvement marked by reduced facial ROM on right with labial spill during cup sips.  SLP unable to elicit phonation during assessment ; (+) s/s aspiration with immediate cough after several cup sips water (unable to comprehend and execute 3 oz water). Applesauce consumed without outward signs but unable to fully evaulate integrity of swallow function at bedside. Recommend instrumental assessment via FEES or MBS. Continue NPO with oral care until instrumental is performed.    SLP Visit Diagnosis: Dysphagia, unspecified (R13.10)    Aspiration Risk  Moderate aspiration risk    Diet Recommendation NPO   Medication Administration: Via alternative means    Other  Recommendations Oral Care Recommendations: Oral care QID   Follow up Recommendations Other (comment)(TBD)      Frequency and Duration min 2x/week  2 weeks        Prognosis Prognosis for Safe Diet Advancement: Good      Swallow Study   General HPI: The patient is an 83 year old white male who underwent craniotomy 2/14 following large acute left subdural hematoma and discharged 2/16. Presented to ED evening of 2/16 due to difficulty with speech and moving right side. MD suspected he had a prolonged Todd's paralysis after a seisure. CXR Cardiomegaly. No acute cardiopulmonary abnormality. Type of Study: Bedside Swallow Evaluation Previous Swallow Assessment: none Diet Prior to this Study: NPO Temperature Spikes Noted: Yes Respiratory Status: Room air History of Recent Intubation: No Behavior/Cognition: Alert;Requires cueing;Distractible;Cooperative Oral Cavity Assessment: Other (comment)(? candidia) Oral Care Completed by SLP: Yes Oral Cavity - Dentition: Adequate natural dentition Vision: Functional for self-feeding Self-Feeding Abilities: Needs assist;Needs set up Patient Positioning: Upright in bed Baseline Vocal Quality: Other (comment)(no phonation) Volitional Cough: Other (Comment)(suspect apraxia) Volitional Swallow: Unable to elicit    Oral/Motor/Sensory Function Overall Oral Motor/Sensory Function: Moderate impairment Facial ROM: Reduced right;Suspected CN VII (facial) dysfunction Facial Symmetry: Abnormal symmetry right;Suspected CN VII (facial) dysfunction Facial Strength: Reduced right;Suspected CN VII (facial) dysfunction   Ice Chips Ice chips: Not tested   Thin Liquid Thin Liquid: Impaired Presentation: Cup Oral Phase Impairments: Reduced labial seal Oral Phase Functional Implications: Right anterior spillage Pharyngeal  Phase Impairments: Cough - Immediate    Nectar Thick Nectar Thick Liquid: Not tested   Honey Thick Honey Thick Liquid: Not tested   Puree Puree: Impaired Oral Phase Functional Implications: Other (comment)(labial residue) Pharyngeal Phase Impairments: (none)   Solid     Solid: Not tested       Alvin Hudson  03/14/2018,9:35 AM  Alvin Hudson Alvin Hudson, children[facility] [phone] Office [phone]

## 2018-03-14 NOTE — Progress Notes (Signed)
Spoke with MD Terrilee Files about low urine output and new rash on back and chest. Also had not seen cardiology--new orders placed for cards consult and LR bolus.

## 2018-03-14 NOTE — Progress Notes (Addendum)
STROKE TEAM PROGRESS NOTE   SUBJECTIVE (INTERVAL HISTORY) His RN, wife and daughters are at the bedside. Pt seems calmer than yesterday but still global aphasic and right facial droop. However, right arm weakness getting worse, today more flaccid. MRI did not show stroke or abscess. However, MRA showed severe left MCA M2s severe stenosis. I would not rule out vasospasm at this time.   He also had afib RVR yesterday afternoon, HR 150-160s. Put on cardizem drip and cardiology on board today. Currently HR controlled. His LTM EEG overnight did not show seizure activity. However, his BP still on the low end, I discussed with Dr. Lovell SheehanJenkins this am and will increase IVF and improve BP.    OBJECTIVE Temp:  [98.9 F (37.2 C)-100.1 F (37.8 C)] 98.9 F (37.2 C) (02/18 1600) Pulse Rate:  [70-119] 91 (02/18 1300) Cardiac Rhythm: Heart block;Atrial fibrillation (02/18 0800) Resp:  [8-30] 14 (02/18 1300) BP: (86-125)/(52-100) 117/78 (02/18 1300) SpO2:  [91 %-100 %] 95 % (02/18 1300)  Recent Labs  Lab 03/10/18 0656 03/12/18 2022  GLUCAP 95 129*   Recent Labs  Lab 03/10/18 0659 03/10/18 1719 03/12/18 2001 03/14/18 0422  NA 134* 138 131* 134*  K 3.5 3.3* 3.7 3.6  CL 99  --  97* 99  CO2 29  --  26 20*  GLUCOSE 113*  --  138* 116*  BUN 10  --  8 11  CREATININE 0.66  --  0.75 0.74  CALCIUM 9.1  --  9.0 8.7*   Recent Labs  Lab 03/12/18 2001  AST 40  ALT 32  ALKPHOS 49  BILITOT 1.8*  PROT 6.3*  ALBUMIN 3.5   Recent Labs  Lab 03/10/18 0659 03/10/18 1719 03/12/18 2001 03/14/18 0422  WBC 9.3  --  13.8* 12.9*  NEUTROABS  --   --  10.3*  --   HGB 13.3 9.2* 12.0* 11.0*  HCT 39.9 27.0* 36.4* 33.0*  MCV 106.7*  --  105.8* 103.8*  PLT 218  --  252 240   Recent Labs  Lab 03/10/18 0659 03/14/18 0435  TROPONINI 0.04* 0.15*   Recent Labs    03/12/18 2001  LABPROT 14.1  INR 1.10   Recent Labs    03/13/18 2003  COLORURINE AMBER*  LABSPEC 1.032*  PHURINE 6.0  GLUCOSEU  NEGATIVE  HGBUR SMALL*  BILIRUBINUR NEGATIVE  KETONESUR 80*  PROTEINUR 30*  NITRITE NEGATIVE  LEUKOCYTESUR NEGATIVE    No results found for: CHOL, TRIG, HDL, CHOLHDL, VLDL, LDLCALC No results found for: HGBA1C No results found for: LABOPIA, COCAINSCRNUR, LABBENZ, AMPHETMU, THCU, LABBARB  No results for input(s): ETH in the last 168 hours.  I have personally reviewed the radiological images below and agree with the radiology interpretations.  Ct Head Wo Contrast  Result Date: 03/12/2018 CLINICAL DATA:  Possible stroke, right-sided weakness with incomprehensible speech. History of seizures. EXAM: CT HEAD WITHOUT CONTRAST TECHNIQUE: Contiguous axial images were obtained from the base of the skull through the vertex without intravenous contrast. COMPARISON:  03/10/2018 FINDINGS: Brain: Redistribution of postop blood products over the left frontal and parietal convexities current exam. There is redemonstration of a small amount postoperative air overlying the left cerebral convexity. No significant change in the appearance of brain is otherwise noted. No significant mass effect or midline shift. Chronic small vessel ischemic disease of periventricular white matter. No herniation. Midline ventricle basal cisterns without effacement. Brainstem and cerebellum are nonacute. Vascular: Atherosclerosis of the carotid siphons. No hyperdense vessel sign. Skull: Postop  change from left lateral craniotomy with overlying scalp soft tissue emphysema and residual blood products with overlying skin staples. Sinuses/Orbits: Intact orbits and globes. Clear paranasal sinuses. Other: Left periorbital and malar soft tissue swelling. IMPRESSION: 1. Redistribution of postop blood products over the left frontal and parietal convexities on current exam. No significant mass effect or midline shift. No acute intracranial appearing abnormality. 2. Postop change from left lateral craniotomy with overlying scalp soft tissue  emphysema and residual blood products. 3. Left periorbital and malar soft tissue swelling. Electronically Signed   By: Tollie Eth M.D.   On: 03/12/2018 20:52   Ct Head Wo Contrast  Result Date: 03/10/2018 CLINICAL DATA:  83 y/o  M; subdural hemorrhage for follow-up. EXAM: CT HEAD WITHOUT CONTRAST TECHNIQUE: Contiguous axial images were obtained from the base of the skull through the vertex without intravenous contrast. COMPARISON:  03/10/2018 CT head. FINDINGS: Brain: Interval left lateral craniotomy for evacuation of subdural hematoma. There is a small postoperative air and fluid-filled collection overlying the left cerebral convexity with minimal residual blood products. There is an interval decrease of mass effect with improved patency of the left lateral ventricle and near complete resolution of left to right midline shift. No new intracranial hemorrhage, stroke, mass lesion, hydrocephalus, or herniation is identified. Vascular: Calcific atherosclerosis of carotid siphons. No hyperdense vessel. Skull: Postsurgical changes related to a left lateral craniotomy with air and edema in the overlying scalp and a scalp drain noted. Sinuses/Orbits: Mild mucosal thickening of the maxillary sinuses and the left posterior ethmoid air cells. Normal aeration of the mastoid air cells. Orbits are unremarkable. Other: None. IMPRESSION: 1. Interval left lateral craniotomy with expected postsurgical changes. 2. Small postoperative air and fluid-filled collection overlying the left cerebral convexity with minimal residual blood products. 3. Decreased mass effect with improved patency of the left lateral ventricle and near complete resolution of left to right midline shift. 4. No new intracranial abnormality identified. Electronically Signed   By: Mitzi Hansen M.D.   On: 03/10/2018 22:00   Ct Head Wo Contrast  Result Date: 03/10/2018 CLINICAL DATA:  Aphasic.  Recent fall EXAM: CT HEAD WITHOUT CONTRAST TECHNIQUE:  Contiguous axial images were obtained from the base of the skull through the vertex without intravenous contrast. COMPARISON:  March 01, 2018 FINDINGS: Brain: There is an acute left-sided subdural hematoma with involvement primarily in the left temporal region but also to a lesser extent in the left frontal region. There is a maximum thickness of this subdural hematoma 1.5 cm. There is mass effect on the superior left temporal lobe and posterior most aspect of the left frontal lobe. There is 3 mm of midline shift to the right. There is mild underlying atrophy. No intra-axial mass or intra-axial hemorrhage noted. No other extra-axial fluid collections are identified. There is slight small vessel disease in the centra semiovale bilaterally. No evident acute infarct. Vascular: No hyperdense vessels. There is calcification in each carotid siphon region. Skull: There is a subtle linear fracture in the left temporal bone. Bony calvarium elsewhere appears intact. Sinuses/Orbits: There is mucosal thickening in several ethmoid air cells. Other visualized paranasal sinuses are clear. Visualized orbits appear symmetric bilaterally. Other: Mastoid air cells are clear. IMPRESSION: Left-sided subdural hematoma, acute, with a maximum thickness of 1.5 cm. This subdural hematoma primarily involves the left temporal region but also involves the posterior left frontal region. There is mass effect on the left temporal lobe laterally with 3 mm of midline shift toward the right.  No intra-axial hemorrhage or mass. Slight periventricular small vessel disease. Nondisplaced fracture left temporal bone. Mucosal thickening in several ethmoid air cells. Critical Value/emergent results were called by telephone at the time of interpretation on 03/10/2018 at 7:48 am to the covering ED department physician, who verbally acknowledged these results. Electronically Signed   By: Bretta Bang III M.D.   On: 03/10/2018 07:49   Ct Head Wo  Contrast  Result Date: 03/01/2018 CLINICAL DATA:  Fall.  Headache EXAM: CT HEAD WITHOUT CONTRAST CT CERVICAL SPINE WITHOUT CONTRAST TECHNIQUE: Multidetector CT imaging of the head and cervical spine was performed following the standard protocol without intravenous contrast. Multiplanar CT image reconstructions of the cervical spine were also generated. COMPARISON:  None. FINDINGS: CT HEAD FINDINGS Brain: Generalized atrophy without hydrocephalus. Negative for acute infarct, hemorrhage, or mass lesion. Vascular: Negative for hyperdense vessel Skull: Negative foot Sinuses/Orbits: Negative Other: None CT CERVICAL SPINE FINDINGS Alignment: Mild anterolisthesis C2-3. Mild retrolisthesis C3-4, C4-5, C5-6, and C6-7. Skull base and vertebrae: Negative for fracture Soft tissues and spinal canal: Negative Disc levels: Advanced disc degeneration and spurring C3 through C7. Multilevel spinal and foraminal stenosis due to spurring. Upper chest: Negative Other: None IMPRESSION: 1. No acute intracranial abnormality 2. Advanced cervical spondylosis.  Negative for fracture. Electronically Signed   By: Marlan Palau M.D.   On: 03/01/2018 16:33   Ct Cervical Spine Wo Contrast  Result Date: 03/01/2018 CLINICAL DATA:  Fall.  Headache EXAM: CT HEAD WITHOUT CONTRAST CT CERVICAL SPINE WITHOUT CONTRAST TECHNIQUE: Multidetector CT imaging of the head and cervical spine was performed following the standard protocol without intravenous contrast. Multiplanar CT image reconstructions of the cervical spine were also generated. COMPARISON:  None. FINDINGS: CT HEAD FINDINGS Brain: Generalized atrophy without hydrocephalus. Negative for acute infarct, hemorrhage, or mass lesion. Vascular: Negative for hyperdense vessel Skull: Negative foot Sinuses/Orbits: Negative Other: None CT CERVICAL SPINE FINDINGS Alignment: Mild anterolisthesis C2-3. Mild retrolisthesis C3-4, C4-5, C5-6, and C6-7. Skull base and vertebrae: Negative for fracture Soft  tissues and spinal canal: Negative Disc levels: Advanced disc degeneration and spurring C3 through C7. Multilevel spinal and foraminal stenosis due to spurring. Upper chest: Negative Other: None IMPRESSION: 1. No acute intracranial abnormality 2. Advanced cervical spondylosis.  Negative for fracture. Electronically Signed   By: Marlan Palau M.D.   On: 03/01/2018 16:33   Mr Maxine Glenn Head Wo Contrast  Result Date: 03/13/2018 CLINICAL DATA:  Altered mental status EXAM: MRI HEAD WITHOUT CONTRAST MRA HEAD WITHOUT CONTRAST TECHNIQUE: Multiplanar, multiecho pulse sequences of the brain and surrounding structures were obtained without intravenous contrast. Angiographic images of the head were obtained using MRA technique without contrast. COMPARISON:  Head CT 03/12/2018 FINDINGS: MRI HEAD FINDINGS BRAIN: There are extra-axial blood products over the left convexity. Multiple tiny foci of cortical diffusion weighted abnormality likely due to the presence of nearby blood. No acute infarct is identified. The left convexity collection measures up to 7 mm in thickness. The midline structures are normal. There are no old infarcts. The white matter signal is normal for the patient's age. Generalized atrophy without lobar predilection. Susceptibility-sensitive sequences show no chronic microhemorrhage or superficial siderosis. SKULL AND UPPER CERVICAL SPINE: Status post left pterional craniotomy. SINUSES/ORBITS: No fluid levels or advanced mucosal thickening. No mastoid or middle ear effusion. The orbits are normal. MRA HEAD FINDINGS POSTERIOR CIRCULATION: --Basilar artery: Normal. --Posterior cerebral arteries: Normal. Both originate from the basilar artery. --Superior cerebellar arteries: Normal. --Inferior cerebellar arteries: Normal anterior and posterior inferior cerebellar  arteries. ANTERIOR CIRCULATION: --Intracranial internal carotid arteries: Normal. --Anterior cerebral arteries: Normal. Both A1 segments are present.  Patent anterior communicating artery. --Middle cerebral arteries: Multifocal moderate-to-severe atherosclerotic narrowing of the M2 branches. Unremarkable right MCA. --Posterior communicating arteries: Absent bilaterally. IMPRESSION: 1. 7 mm extra-axial hematoma over the left convexity, as previously demonstrated on earlier head CT. No new site of hemorrhage. No acute ischemia. 2. 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 No intracranial large vessel occlusion. Electronically Signed   By: Deatra Robinson M.D.   On: 03/13/2018 17:48   Mr Laqueta Jean ZO Contrast  Result Date: 03/13/2018 CLINICAL DATA:  Altered mental status EXAM: MRI HEAD WITHOUT CONTRAST MRA HEAD WITHOUT CONTRAST TECHNIQUE: Multiplanar, multiecho pulse sequences of the brain and surrounding structures were obtained without intravenous contrast. Angiographic images of the head were obtained using MRA technique without contrast. COMPARISON:  Head CT 03/12/2018 FINDINGS: MRI HEAD FINDINGS BRAIN: There are extra-axial blood products over the left convexity. Multiple tiny foci of cortical diffusion weighted abnormality likely due to the presence of nearby blood. No acute infarct is identified. The left convexity collection measures up to 7 mm in thickness. The midline structures are normal. There are no old infarcts. The white matter signal is normal for the patient's age. Generalized atrophy without lobar predilection. Susceptibility-sensitive sequences show no chronic microhemorrhage or superficial siderosis. SKULL AND UPPER CERVICAL SPINE: Status post left pterional craniotomy. SINUSES/ORBITS: No fluid levels or advanced mucosal thickening. No mastoid or middle ear effusion. The orbits are normal. MRA HEAD FINDINGS POSTERIOR CIRCULATION: --Basilar artery: Normal. --Posterior cerebral arteries: Normal. Both originate from the basilar artery. --Superior cerebellar arteries: Normal. --Inferior cerebellar  arteries: Normal anterior and posterior inferior cerebellar arteries. ANTERIOR CIRCULATION: --Intracranial internal carotid arteries: Normal. --Anterior cerebral arteries: Normal. Both A1 segments are present. Patent anterior communicating artery. --Middle cerebral arteries: Multifocal moderate-to-severe atherosclerotic narrowing of the M2 branches. Unremarkable right MCA. --Posterior communicating arteries: Absent bilaterally. IMPRESSION: 1. 7 mm extra-axial hematoma over the left convexity, as previously demonstrated on earlier head CT. No new site of hemorrhage. No acute ischemia. 2. 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 No intracranial large vessel occlusion. Electronically Signed   By: Deatra Robinson M.D.   On: 03/13/2018 17:48   Dg Chest Port 1 View  Result Date: 03/14/2018 CLINICAL DATA:  83 year old male with fever for 1 day. Altered mental status. EXAM: PORTABLE CHEST 1 VIEW COMPARISON:  No prior chest imaging. FINDINGS: Portable AP supine view at 2129 hours. Cardiomegaly. Tortuous thoracic aorta. Other mediastinal contours are within normal limits. Visualized tracheal air column is within normal limits. Allowing for portable technique the lungs are clear. No pneumothorax or pleural effusion is evident. No acute osseous abnormality identified. IMPRESSION: Cardiomegaly. No acute cardiopulmonary abnormality. Electronically Signed   By: Odessa Fleming M.D.   On: 03/14/2018 00:55    PHYSICAL EXAM  Temp:  [98.9 F (37.2 C)-100.1 F (37.8 C)] 98.9 F (37.2 C) (02/18 1600) Pulse Rate:  [70-119] 91 (02/18 1300) Resp:  [8-30] 14 (02/18 1300) BP: (86-125)/(52-100) 117/78 (02/18 1300) SpO2:  [91 %-100 %] 95 % (02/18 1300)  General - well nourished, well developed, mildly agitated, restless.    Ophthalmologic - fundi not visualized due to noncooperation.    Cardiovascular - regular rate and rhythm, not in afib at this time  Neuro - awake alert,  restless, mildly agitated, global aphasia, no sound, not following commands.  Not able to name or  repeat.  Neck supple, no meningismus sign.  PERRL, EOMI, able to track on both sides.  Not consistent with visual threat testing, however seems to have right hemianopia versus neglect.  Right facial droop, tongue midline in mouth.  Left upper and bilateral lower extremities normal strength, right upper extremity flaccid, mild withdraw with pain stimulation.  DTR 1+, no Babinski. Sensation, coordination and gait not tested.   ASSESSMENT/PLAN Mr. Alvin Hudson is a 83 y.o. male with history of seizure on Keppra and MCI following with Dr. Rulon Eisenmenger at [facility] admitted for temporoparietal SDH. Underwent surgical evacuation by Dr. Lovell Sheehan and repeat CT post op showed decreased mass-effect and midline shift.  He was discharged but then readmitted due to acute onset altered mental status, global aphasia, and right arm weakness. Repeat CT showed no acute changes. Received Keppra 1g load for concerns of seizure.  However, long-term EEG overnight no seizure seen.    ? Seizure with prolonged post ictal   LTM EEG no seizure seen  On keppra 750mg  bid home dose  No clinical seizure  No neuro improvement overnight, and instead, right arm weakness getting worse  Take off LTM EEG today  ? Vasospasm   MRI no acute infarct or abscess  CT and MRI showed SDH improved after evacuation  MRA showed severe stenosis left M2 both branches with decreased distal filling.   No previous MRA or CTA to compare  No good explanation for vasospasm - no SAH this time  Recommend IVF and keep SBP 130-150 to improve perfusion  May consider CTA head and neck if no improvement tomorrow.  afib RVR  New diagnosis  On cardizem drip  Cardiology on board  Avoid low BP  Consider wean off IV once po access  SDH s/p evacuation  CT and MRI post op showed good evacuation  SDH near resolved   Low grade fever,  improving  Tmax 100.3  WBC 13.8->12.9  UA neg  CXR unremarkable  Blood culture NGTD  Ammonia level normal  Dysphagia   Did not pass swallow  Speech on board  Reassess in am  Consider cortrak if needed  Hospital day # 2  This patient is critically ill due to aphasia and right arm plegia, seizure, left MCA stenosis and at significant risk of neurological worsening, death form stroke, status epilepticus, SDH expansion, brain herniation. This patient's care requires constant monitoring of vital signs, hemodynamics, respiratory and cardiac monitoring, review of multiple databases, neurological assessment, discussion with family, other specialists and medical decision making of high complexity. I spent 40 minutes of neurocritical care time in the care of this patient. I had long discussion with wife and daughters at bedside, updated pt current condition, treatment plan and potential prognosis. They expressed understanding and appreciation. I also discussed with Dr. Lovell Sheehan this am.  Marvel Plan, MD PhD Stroke Neurology 03/14/2018 5:05 PM    To contact Stroke Continuity provider, please refer to [email or web address]. After hours, contact General Neurology

## 2018-03-14 NOTE — Progress Notes (Signed)
CRITICAL VALUE ALERT  Critical Value:  Troponin 0.15  Date & Time Notied:  03/14/2018 0545  Provider Notified: Leo Grosser NP  Orders Received/Actions taken: Consulting Cardiology

## 2018-03-14 NOTE — Progress Notes (Signed)
Pt experienced 2x witnessed episodes of seizure-like activity with rhythmic right facial twitching and arms shaking lasting approx 30-45 secs with minimal pos tictal state. Notified MD Marvel Plan with neurology--Second episode 2mg  of ativan was given. Order was placed for 1g keppra and to be placed back on EEG. Pt stable at this time.

## 2018-03-14 NOTE — Progress Notes (Signed)
STAT LTM started 

## 2018-03-14 NOTE — Progress Notes (Signed)
Patient had a clinical seizure at 11:03 PM which was witnessed by nursing staff. Initially on 750 mg BID of Keppra, he received a supplemental dose of 1000 mg IV this afternoon after a breakthrough seizure, prior to starting LTM.   EEG was reviewed and an electrographic seizure beginning at 23:01:33 and ending at 23:04:16 is noted.   A/R: 83 year old male with known seizure disorder, presenting with breakthrough seizures 1. Given that his Keppra was just increased this afternoon with serum levels not likely to have equilibrated fully at this time, will continue on current regiment of 1500 mg IV BID and continue to monitor LTM EEG.  2. Receiving his next scheduled dose of 1500 mg Keppra now.  3. If seizure recurs after scheduled Keppra dose, will start the patient on valproic acid. LFTs are normal and he has no history of liver disease. Valproic acid is generally regarded as a good anticonvulsant choice in the geriatric population.   Electronically signed: Dr. Caryl Pina

## 2018-03-14 NOTE — Procedures (Signed)
Electroencephalogram report- LTM  Ordering Physician : Dr. Amada Jupiter    Beginning date or time: 03/13/2008 11:01 hours Ending date or time:03/14/2018 11:47 hours  Day of study: day 1  Medications include: Per EMR  MENTAL STATUS (per technician's notes): Lethargic.  Confused  HISTORY: This 24 hours of intensive EEG monitoring with simultaneous video monitoring was performed for this patient with Subdural hemorrhage and altered mental status. This EEG was requested to rule out subclinical electrographic seizures.  TECHNICAL DESCRIPTION:  The study consists of a continuous 16-channel multi-montage digital video EEG recording with twenty-one electrodes placed according to the International 10-20 System. Additional leads included eye leads, true temporal leads (T1, T2), and an EKG lead. Activation procedures were not done due to mental status.  REPORT: The background activity in this tracing consisted of polymorphic delta and theta background, with best of  7 Hz on the right seen occasionally. The activity seemed to be reactive to tactile stimuli.  Low voltage polymorphic delta activity was prominent in the left hemispheric region.  No epileptiform activity was identified.     IMPRESSION: This is an abnormal EEG due to: Focal slowing in the left hemispheric region  diffuse slowing.  CLINICAL CORRELATION: This EEG is consistent with focal neuronal dysfunction in the left hemispheric region, with superimposed nonspecific diffuse cerebral dysfunction. No electrographic seizures were seen.

## 2018-03-15 ENCOUNTER — Inpatient Hospital Stay (HOSPITAL_COMMUNITY): Payer: Medicare Other

## 2018-03-15 DIAGNOSIS — I340 Nonrheumatic mitral (valve) insufficiency: Secondary | ICD-10-CM

## 2018-03-15 DIAGNOSIS — I351 Nonrheumatic aortic (valve) insufficiency: Secondary | ICD-10-CM

## 2018-03-15 DIAGNOSIS — R4701 Aphasia: Secondary | ICD-10-CM

## 2018-03-15 DIAGNOSIS — Q248 Other specified congenital malformations of heart: Secondary | ICD-10-CM

## 2018-03-15 LAB — GLUCOSE, CAPILLARY
Glucose-Capillary: 119 mg/dL — ABNORMAL HIGH (ref 70–99)
Glucose-Capillary: 92 mg/dL (ref 70–99)
Glucose-Capillary: 98 mg/dL (ref 70–99)

## 2018-03-15 LAB — MAGNESIUM: Magnesium: 1.6 mg/dL — ABNORMAL LOW (ref 1.7–2.4)

## 2018-03-15 LAB — CBC
HCT: 29 % — ABNORMAL LOW (ref 39.0–52.0)
Hemoglobin: 9.5 g/dL — ABNORMAL LOW (ref 13.0–17.0)
MCH: 34.8 pg — ABNORMAL HIGH (ref 26.0–34.0)
MCHC: 32.8 g/dL (ref 30.0–36.0)
MCV: 106.2 fL — ABNORMAL HIGH (ref 80.0–100.0)
PLATELETS: 196 10*3/uL (ref 150–400)
RBC: 2.73 MIL/uL — ABNORMAL LOW (ref 4.22–5.81)
RDW: 13.2 % (ref 11.5–15.5)
WBC: 10.2 10*3/uL (ref 4.0–10.5)
nRBC: 0 % (ref 0.0–0.2)

## 2018-03-15 LAB — PHOSPHORUS: Phosphorus: 1.6 mg/dL — ABNORMAL LOW (ref 2.5–4.6)

## 2018-03-15 LAB — BASIC METABOLIC PANEL
Anion gap: 7 (ref 5–15)
BUN: 5 mg/dL — AB (ref 8–23)
CO2: 23 mmol/L (ref 22–32)
Calcium: 7.7 mg/dL — ABNORMAL LOW (ref 8.9–10.3)
Chloride: 106 mmol/L (ref 98–111)
Creatinine, Ser: 0.6 mg/dL — ABNORMAL LOW (ref 0.61–1.24)
GFR calc Af Amer: >60 mL/min (ref >60)
Glucose, Bld: 113 mg/dL — ABNORMAL HIGH (ref 70–99)
Potassium: 5.4 mmol/L — ABNORMAL HIGH (ref 3.5–5.1)
Sodium: 136 mmol/L (ref 135–145)

## 2018-03-15 LAB — ECHOCARDIOGRAM COMPLETE

## 2018-03-15 MED ORDER — LORAZEPAM 2 MG/ML IJ SOLN
2.0000 mg | Freq: Once | INTRAMUSCULAR | Status: AC
  Administered 2018-03-15: 2 mg via INTRAVENOUS

## 2018-03-15 MED ORDER — OSMOLITE 1.2 CAL PO LIQD
1000.0000 mL | ORAL | Status: DC
  Administered 2018-03-15 – 2018-03-19 (×6): 1000 mL
  Filled 2018-03-15 (×9): qty 1000

## 2018-03-15 MED ORDER — VALPROATE SODIUM 500 MG/5ML IV SOLN
20.0000 mg/kg | Freq: Once | INTRAVENOUS | Status: AC
  Administered 2018-03-15: 1424 mg via INTRAVENOUS
  Filled 2018-03-15: qty 14.24

## 2018-03-15 MED ORDER — LORAZEPAM 2 MG/ML IJ SOLN
INTRAMUSCULAR | Status: AC
  Filled 2018-03-15: qty 1

## 2018-03-15 MED ORDER — SODIUM CHLORIDE 0.9 % IV SOLN: 200.0000 mg | Freq: Once | INTRAVENOUS | Status: DC

## 2018-03-15 MED ORDER — PRO-STAT SUGAR FREE PO LIQD
30.0000 mL | Freq: Every day | ORAL | Status: DC
  Administered 2018-03-15 – 2018-03-19 (×5): 30 mL
  Filled 2018-03-15 (×5): qty 30

## 2018-03-15 MED ORDER — VALPROATE SODIUM 500 MG/5ML IV SOLN
15.0000 mg/kg/d | Freq: Three times a day (TID) | INTRAVENOUS | Status: DC
  Administered 2018-03-15 – 2018-03-16 (×4): 356 mg via INTRAVENOUS
  Filled 2018-03-15 (×7): qty 3.56

## 2018-03-15 MED ORDER — LORAZEPAM 2 MG/ML IJ SOLN: 2.0000 mg | Freq: Once | INTRAMUSCULAR | Status: DC

## 2018-03-15 MED ORDER — SODIUM CHLORIDE 0.9 % IV SOLN
200.0000 mg | Freq: Two times a day (BID) | INTRAVENOUS | Status: DC
  Administered 2018-03-15 – 2018-03-16 (×3): 200 mg via INTRAVENOUS
  Filled 2018-03-15 (×3): qty 20

## 2018-03-15 MED ORDER — MIDAZOLAM HCL 2 MG/2ML IJ SOLN
INTRAMUSCULAR | Status: AC
  Filled 2018-03-15: qty 2

## 2018-03-15 MED ORDER — SODIUM CHLORIDE 0.9 % IV SOLN
INTRAVENOUS | Status: DC
  Administered 2018-03-15 – 2018-03-17 (×5): via INTRAVENOUS

## 2018-03-15 NOTE — ED Provider Notes (Signed)
I saw the patient on arrival to [facility] ED and he appeared to have similar exam to his transfer exam by transferring EDP - he had some difficulty with speech - Ns was contacted and the pt will be admitted nad likely need surgery   Eber Hong, MD 03/15/18 [phone]

## 2018-03-15 NOTE — Progress Notes (Signed)
LTM EEG checked, no skin breakdown noted. Computer was restarted.

## 2018-03-15 NOTE — Progress Notes (Signed)
Neurosurgery Service Progress Note  Subjective: Clinical seizures overnight, sounds like automatisms with swallowing movements and associated desats, no events since  Objective: Vitals:   03/15/18 0500 03/15/18 0600 03/15/18 0700 03/15/18 0800  BP: 130/71 121/73 135/77 127/75  Pulse: 70 76 78 71  Resp: (!) 23 19 (!) 25 (!) 23  Temp:   98.8 F (37.1 C)   TempSrc:   Oral   SpO2: 97% 96% 97% 96%   Temp (24hrs), Avg:98.8 F (37.1 C), Min:98.5 F (36.9 C), Max:99.1 F (37.3 C)  CBC Latest Ref Rng & Units 03/15/2018 03/14/2018 03/12/2018  WBC 4.0 - 10.5 K/uL 10.2 12.9(H) 13.8(H)  Hemoglobin 13.0 - 17.0 g/dL 7.5(Z) 11.0(L) 12.0(L)  Hematocrit 39.0 - 52.0 % 29.0(L) 33.0(L) 36.4(L)  Platelets 150 - 400 K/uL 196 240 252   BMP Latest Ref Rng & Units 03/15/2018 03/14/2018 03/12/2018  Glucose 70 - 99 mg/dL 972(Q) 206(O) 156(F)  BUN 8 - 23 mg/dL 5(L) 11 8  Creatinine 5.37 - 1.24 mg/dL 9.43(E) 7.61 4.70  Sodium 135 - 145 mmol/L 136 134(L) 131(L)  Potassium 3.5 - 5.1 mmol/L 5.4(H) 3.6 3.7  Chloride 98 - 111 mmol/L 106 99 97(L)  CO2 22 - 32 mmol/L 23 20(L) 26  Calcium 8.9 - 10.3 mg/dL 7.7(L) 8.7(L) 9.0    Intake/Output Summary (Last 24 hours) at 03/15/2018 0919 Last data filed at 03/15/2018 0800 Gross per 24 hour  Intake 2905.58 ml  Output 2255 ml  Net 650.58 ml    Current Facility-Administered Medications:  .  0.9 %  sodium chloride infusion, , Intravenous, Continuous, Meyran, Tiana Loft, NP, Stopped at 03/14/18 0830 .  0.9 % NaCl with KCl 20 mEq/ L  infusion, , Intravenous, Continuous, Tressie Stalker, MD, Last Rate: 100 mL/hr at 03/15/18 0800 .  acetaminophen (TYLENOL) tablet 650 mg, 650 mg, Oral, Q4H PRN **OR** acetaminophen (TYLENOL) suppository 650 mg, 650 mg, Rectal, Q4H PRN, Jadene Pierini, MD, 650 mg at 03/14/18 1232 .  atorvastatin (LIPITOR) tablet 20 mg, 20 mg, Oral, Daily, Antoney Biven A, MD .  B-complex with vitamin C tablet 1 tablet, 1 tablet, Oral, Daily,  Tressie Stalker, MD .  chlorhexidine (PERIDEX) 0.12 % solution 15 mL, 15 mL, Mouth Rinse, BID, Meyran, Tiana Loft, NP, 15 mL at 03/14/18 2222 .  diltiazem (CARDIZEM) 100 mg in dextrose 5% (1 mg/mL) infusion, 5-15 mg/hr, Intravenous, Titrated, Marvel Plan, MD, Last Rate: 10 mL/hr at 03/15/18 0800, 10 mg/hr at 03/15/18 0800 .  docusate sodium (COLACE) capsule 100 mg, 100 mg, Oral, BID, Adler Alton A, MD .  haloperidol lactate (HALDOL) injection 2 mg, 2 mg, Intravenous, Q6H PRN, Marvel Plan, MD, 2 mg at 03/13/18 1517 .  HYDROcodone-acetaminophen (NORCO/VICODIN) 5-325 MG per tablet 1 tablet, 1 tablet, Oral, Q4H PRN, Vince Ainsley A, MD .  labetalol (NORMODYNE,TRANDATE) injection 10-40 mg, 10-40 mg, Intravenous, Q10 min PRN, Jadene Pierini, MD .  levETIRAcetam (KEPPRA) IVPB 1500 mg/ 100 mL premix, 1,500 mg, Intravenous, Q12H, Marvel Plan, MD, Stopped at 03/14/18 2323 .  MEDLINE mouth rinse, 15 mL, Mouth Rinse, q12n4p, Meyran, Tiana Loft, NP, 15 mL at 03/14/18 1211 .  ondansetron (ZOFRAN) tablet 4 mg, 4 mg, Oral, Q4H PRN **OR** ondansetron (ZOFRAN) injection 4 mg, 4 mg, Intravenous, Q4H PRN, Tanis Hensarling, Clovis Pu, MD .  polycarbophil (FIBERCON) tablet 625 mg, 625 mg, Oral, Daily, Tylor Gambrill A, MD .  promethazine (PHENERGAN) tablet 12.5-25 mg, 12.5-25 mg, Oral, Q4H PRN, Jadene Pierini, MD .  sertraline (ZOLOFT) tablet  100 mg, 100 mg, Oral, Daily, Odus Clasby A, MD .  temazepam (RESTORIL) capsule 15 mg, 15 mg, Oral, QHS, Shantoria Ellwood A, MD .  valproate (DEPACON) 356 mg in dextrose 5 % 50 mL IVPB, 15 mg/kg/day, Intravenous, Q8H, Caryl Pina, MD, Last Rate: 53.6 mL/hr at 03/15/18 0918, 356 mg at 03/15/18 4315   Physical Exam: Somnolent to obtunded, eyes open to painful stimulus, PERRL, gaze neutral, MAEx4 spontaneously with left sided preference, incision c/d/i  Assessment & Plan: 83 y.o. man s/p evacuation of L SDH with good recovery post-op then  development of aphasia and R sided weakness. 2/19 early AM clinical seizure, loaded w/ VPA -I'm following the patient while Dr. Lovell Sheehan is out of town -clinical and electrographic seizures consistent with suspected pathology given waxing/waning exam -defer Sz management to neurology  Jadene Pierini  03/15/18 9:19 AM

## 2018-03-15 NOTE — Progress Notes (Addendum)
The patient has been seen in conjunction with Geoffry Paradise, NP. All aspects of care have been considered and discussed. The patient has been personally interviewed, examined, and all clinical data has been reviewed.   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  No significant aortic stenosis is noted.  Dynamic LVOT requires that we avoid volume contraction as this may worsen LV outflow gradient.  This problem is not relevant to the current clinical state.  A. fib has not recurred and he remains on a diltiazem drip.  The patient does have cardiac structural abnormality and abnormal physiology which can predispose to atrial fibrillation.  Once patient able to take oral meds, we will likely switch from diltiazem to a beta-blocker which will help decrease LVOT obstruction.  No specific recommendations at this time.  Not currently a candidate for anticoagulation therapy.   Progress Note  Patient Name: Alvin Hudson Date of Encounter: 03/15/2018  Primary Cardiologist: No primary care provider on file.   Subjective   Somnolent but does respond to stimuli.   Inpatient Medications    Scheduled Meds: . atorvastatin  20 mg Oral Daily  . B-complex with vitamin C  1 tablet Oral Daily  . chlorhexidine  15 mL Mouth Rinse BID  . docusate sodium  100 mg Oral BID  . mouth rinse  15 mL Mouth Rinse q12n4p  . polycarbophil  625 mg Oral Daily  . sertraline  100 mg Oral Daily  . temazepam  15 mg Oral QHS   Continuous Infusions: . sodium chloride Stopped (03/14/18 0830)  . 0.9 % NaCl with KCl 20 mEq / L 100 mL/hr at 03/15/18 0900  . diltiazem (CARDIZEM) infusion 10 mg/hr (03/15/18 0900)  . levETIRAcetam Stopped (03/14/18 2323)  . valproate sodium 356 mg (03/15/18 0918)   PRN Meds: acetaminophen **OR** acetaminophen, haloperidol lactate, HYDROcodone-acetaminophen, labetalol, ondansetron **OR** ondansetron (ZOFRAN) IV,  promethazine   Vital Signs    Vitals:   03/15/18 0600 03/15/18 0700 03/15/18 0800 03/15/18 0900  BP: 121/73 135/77 127/75 (!) 147/74  Pulse: 76 78 71 80  Resp: 19 (!) 25 (!) 23 (!) 25  Temp:  98.8 F (37.1 C)    TempSrc:  Oral    SpO2: 96% 97% 96% 96%    Intake/Output Summary (Last 24 hours) at 03/15/2018 1002 Last data filed at 03/15/2018 0900 Gross per 24 hour  Intake 2748.78 ml  Output 2155 ml  Net 593.78 ml   Last 3 Weights 03/10/2018 03/01/2018  Weight (lbs) 157 lb 155 lb  Weight (kg) 71.215 kg 70.308 kg      Telemetry    SR - Personally Reviewed  ECG    N/a - Personally Reviewed  Physical Exam   GEN: No acute distress.   Neck: No JVD Cardiac: RRR, 2/6 systolic murmur Respiratory: Clear to auscultation bilaterally. GI: Soft, nontender, non-distended  MS: No edema; No deformity. Neuro:  obtunded, but responds to verbal stimuli.  Labs    Chemistry Recent Labs  Lab 03/12/18 2001 03/14/18 0422 03/15/18 0453  NA 131* 134* 136  K 3.7 3.6 5.4*  CL 97* 99 106  CO2 26 20* 23  GLUCOSE 138* 116* 113*  BUN 8 11 5*  CREATININE 0.75 0.74 0.60*  CALCIUM 9.0 8.7* 7.7*  PROT 6.3*  --   --   ALBUMIN 3.5  --   --   AST 40  --   --   ALT 32  --   --  ALKPHOS 49  --   --   BILITOT 1.8*  --   --   GFRNONAA >60 >60 >60  GFRAA >60 >60 >60  ANIONGAP 8 15 7      Hematology Recent Labs  Lab 03/12/18 2001 03/14/18 0422 03/15/18 0453  WBC 13.8* 12.9* 10.2  RBC 3.44* 3.18* 2.73*  HGB 12.0* 11.0* 9.5*  HCT 36.4* 33.0* 29.0*  MCV 105.8* 103.8* 106.2*  MCH 34.9* 34.6* 34.8*  MCHC 33.0 33.3 32.8  RDW 13.4 13.1 13.2  PLT 252 240 196    Cardiac Enzymes Recent Labs  Lab 03/10/18 0659 03/14/18 0435 03/14/18 1720  TROPONINI 0.04* 0.15* 0.10*   No results for input(s): TROPIPOC in the last 168 hours.   BNP Recent Labs  Lab 03/14/18 1720  BNP 702.9*     DDimer No results for input(s): DDIMER in the last 168 hours.   Radiology    Mr Maxine GlennMra Head Wo  Contrast  Result Date: 03/13/2018 CLINICAL DATA:  Altered mental status EXAM: MRI HEAD WITHOUT CONTRAST MRA HEAD WITHOUT CONTRAST TECHNIQUE: Multiplanar, multiecho pulse sequences of the brain and surrounding structures were obtained without intravenous contrast. Angiographic images of the head were obtained using MRA technique without contrast. COMPARISON:  Head CT 03/12/2018 FINDINGS: MRI HEAD FINDINGS BRAIN: There are extra-axial blood products over the left convexity. Multiple tiny foci of cortical diffusion weighted abnormality likely due to the presence of nearby blood. No acute infarct is identified. The left convexity collection measures up to 7 mm in thickness. The midline structures are normal. There are no old infarcts. The white matter signal is normal for the patient's age. Generalized atrophy without lobar predilection. Susceptibility-sensitive sequences show no chronic microhemorrhage or superficial siderosis. SKULL AND UPPER CERVICAL SPINE: Status post left pterional craniotomy. SINUSES/ORBITS: No fluid levels or advanced mucosal thickening. No mastoid or middle ear effusion. The orbits are normal. MRA HEAD FINDINGS POSTERIOR CIRCULATION: --Basilar artery: Normal. --Posterior cerebral arteries: Normal. Both originate from the basilar artery. --Superior cerebellar arteries: Normal. --Inferior cerebellar arteries: Normal anterior and posterior inferior cerebellar arteries. ANTERIOR CIRCULATION: --Intracranial internal carotid arteries: Normal. --Anterior cerebral arteries: Normal. Both A1 segments are present. Patent anterior communicating artery. --Middle cerebral arteries: Multifocal moderate-to-severe atherosclerotic narrowing of the M2 branches. Unremarkable right MCA. --Posterior communicating arteries: Absent bilaterally. IMPRESSION: 1. 7 mm extra-axial hematoma over the left convexity, as previously demonstrated on earlier head CT. No new site of hemorrhage. No acute ischemia. 2. 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 No intracranial large vessel occlusion. Electronically Signed   By: Deatra RobinsonKevin  Herman M.D.   On: 03/13/2018 17:48   Mr Laqueta JeanBrain W ZOWo Contrast  Result Date: 03/13/2018 CLINICAL DATA:  Altered mental status EXAM: MRI HEAD WITHOUT CONTRAST MRA HEAD WITHOUT CONTRAST TECHNIQUE: Multiplanar, multiecho pulse sequences of the brain and surrounding structures were obtained without intravenous contrast. Angiographic images of the head were obtained using MRA technique without contrast. COMPARISON:  Head CT 03/12/2018 FINDINGS: MRI HEAD FINDINGS BRAIN: There are extra-axial blood products over the left convexity. Multiple tiny foci of cortical diffusion weighted abnormality likely due to the presence of nearby blood. No acute infarct is identified. The left convexity collection measures up to 7 mm in thickness. The midline structures are normal. There are no old infarcts. The white matter signal is normal for the patient's age. Generalized atrophy without lobar predilection. Susceptibility-sensitive sequences show no chronic microhemorrhage or superficial siderosis. SKULL AND UPPER CERVICAL SPINE: Status post left pterional craniotomy.  SINUSES/ORBITS: No fluid levels or advanced mucosal thickening. No mastoid or middle ear effusion. The orbits are normal. MRA HEAD FINDINGS POSTERIOR CIRCULATION: --Basilar artery: Normal. --Posterior cerebral arteries: Normal. Both originate from the basilar artery. --Superior cerebellar arteries: Normal. --Inferior cerebellar arteries: Normal anterior and posterior inferior cerebellar arteries. ANTERIOR CIRCULATION: --Intracranial internal carotid arteries: Normal. --Anterior cerebral arteries: Normal. Both A1 segments are present. Patent anterior communicating artery. --Middle cerebral arteries: Multifocal moderate-to-severe atherosclerotic narrowing of the M2 branches. Unremarkable right MCA. --Posterior communicating  arteries: Absent bilaterally. IMPRESSION: 1. 7 mm extra-axial hematoma over the left convexity, as previously demonstrated on earlier head CT. No new site of hemorrhage. No acute ischemia. 2. 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 No intracranial large vessel occlusion. Electronically Signed   By: Deatra Robinson M.D.   On: 03/13/2018 17:48   Dg Chest Port 1 View  Result Date: 03/14/2018 CLINICAL DATA:  83 year old male with fever for 1 day. Altered mental status. EXAM: PORTABLE CHEST 1 VIEW COMPARISON:  No prior chest imaging. FINDINGS: Portable AP supine view at 2129 hours. Cardiomegaly. Tortuous thoracic aorta. Other mediastinal contours are within normal limits. Visualized tracheal air column is within normal limits. Allowing for portable technique the lungs are clear. No pneumothorax or pleural effusion is evident. No acute osseous abnormality identified. IMPRESSION: Cardiomegaly. No acute cardiopulmonary abnormality. Electronically Signed   By: Odessa Fleming M.D.   On: 03/14/2018 00:55    Cardiac Studies   N/a   Patient Profile     83 y.o. male with a history of seizures on Keppra and known LBBB but no known cardiac history who is being seen today for the evaluation of atrial fibrillation and elevated troponin.  Assessment & Plan    1. Atrial Fibrillation with RVR: maintaining SR while on Dilt gtt at 10mg /hr. He had another seizure this morning and swallow evaluation has been deferred.  -continue on IV dilt until swallow eval or cortrack placement.  - CHA2DS2-VASc = 2 (age). Given recent acute subdural hematoma, will not start anticoagulation. Will defer to MD and Neurology. Not sure he will be a suitable candidate for [facility]. - Prior to discharge, will set up 30 day event monitor given patient appears to have had more than one syncopal episode.   2. Elevated Troponin - Troponin peaked at 0.15 and trending down. Suspect demand ischemia - Patient has  no known cardiac history and has not had any chest pain. - echo pending  Aortic Stenosis - Patient reportedly has a history of aortic stenosis.  - Most recent Echo from 06/2017 at [facility] reports "aortic stenosis - cannot exclude aortic stenosis." - Echo pending  For questions or updates, please contact [facility] Please consult [email or web address] for contact info under   Signed, Laverda Page, NP  03/15/2018, 10:02 AM

## 2018-03-15 NOTE — Progress Notes (Signed)
Initial Nutrition Assessment  DOCUMENTATION CODES:   Not applicable  INTERVENTION:   Osmolite 1.2 @ 60 ml/hr via Cortrak tube 30 ml Prostat daily  Provides: 1828 kcal, 95 grams protein, and 1167 ml free water.    NUTRITION DIAGNOSIS:   Inadequate oral intake related to inability to eat as evidenced by NPO status.  GOAL:   Patient will meet greater than or equal to 90% of their needs  MONITOR:   TF tolerance, I & O's  REASON FOR ASSESSMENT:   Consult Enteral/tube feeding initiation and management  ASSESSMENT:   Pt with PMH of seizures on keppra admitted 2/16 with L SDH s/p crani 2/14. He was d/c'ed 2/16 but readmitted the same day with AMS/aphasia and afib with RVR.    Pt discussed during ICU rounds and with RN.  Pt now on continuous EEG for further seizures.  Unable to participate in swallow eval, spoke with neurologist and will place a cortrak tube to start nutrition.   Spoke with wife at bedside. She reports that pt eats 3 meals per day and usually has a snack and a dessert. She does mention that both her and pt have ate less in the last 6 months but still eat adequately. She reports his usual weight is 155 -160 lb. No changes in how clothes fit. Did notice that pt has muscle depletions in his deltoids/quadriceps.   Medications reviewed and include: B complex with vitamin C, colace, fibercon Labs reviewed: K+ 5.6 (H), BNP: 703 (H)    NUTRITION - FOCUSED PHYSICAL EXAM:    Most Recent Value  Orbital Region  Mild depletion  Upper Arm Region  No depletion  Thoracic and Lumbar Region  No depletion  Buccal Region  Mild depletion  Temple Region  Unable to assess  Clavicle Bone Region  Unable to assess  Clavicle and Acromion Bone Region  Severe depletion  Scapular Bone Region  Unable to assess  Dorsal Hand  Unable to assess  Patellar Region  Severe depletion  Anterior Thigh Region  Severe depletion  Posterior Calf Region  Moderate depletion  Edema (RD Assessment)   None  Hair  Reviewed  Eyes  Unable to assess  Mouth  Unable to assess  Skin  Reviewed  Nails  Unable to assess       Diet Order:   Diet Order    None      EDUCATION NEEDS:   No education needs have been identified at this time  Skin:  Skin Assessment: (head incision)  Last BM:  unknown  Height:   Ht Readings from Last 1 Encounters:  03/10/18 5\' 11"  (1.803 m)    Weight:   Wt Readings from Last 1 Encounters:  03/10/18 71.2 kg    Ideal Body Weight:  78.1 kg  BMI:  There is no height or weight on file to calculate BMI.  Estimated Nutritional Needs:   Kcal:  1800-2000  Protein:  90-105 grams  Fluid:  > 1.8 L/day  Kendell Bane RD, LDN, CNSC [phone] Pager [phone] After Hours Pager

## 2018-03-15 NOTE — Procedures (Signed)
Cortrak  Person Inserting Tube:  Alvin Hudson, RD Tube Type:  Cortrak - 43 inches Tube Location:  Right nare Initial Placement:  Stomach Secured by: Bridle Technique Used to Measure Tube Placement:  Documented cm marking at nare/ corner of mouth Cortrak Secured At:  94 cm    Cortrak Tube Team Note:  Consult received to place a Cortrak feeding tube.   No x-ray is required. RN may begin using tube.   If the tube becomes dislodged please keep the tube and contact the Cortrak team at [email or web address] ([email or web address]) for replacement.  If after hours and replacement cannot be delayed, place a NG tube and confirm placement with an abdominal x-ray.    Earma Reading, MS, RD, LDN Inpatient Clinical Dietitian Pager: [phone] Weekend/After Hours: [phone]

## 2018-03-15 NOTE — Progress Notes (Signed)
  Echocardiogram 2D Echocardiogram has been performed.  Tye Savoy 03/15/2018, 2:02 PM

## 2018-03-15 NOTE — Progress Notes (Signed)
Patient had another seizure lasting from 30-60 seconds per RN during which repetitive swallowing movements with moaning were noted, in conjunction with desaturation to 87% on [place].   The seizure occurred after completion of the 1500 mg IV Keppra scheduled dose.   2 mg Ativan being administered now.   Loading valproic acid 20 mg/kg IV x 1 now, followed by 5 mg/kg IV TID.   Electronically signed: Dr. Caryl Pina

## 2018-03-15 NOTE — Progress Notes (Signed)
SLP Cancellation Note  Patient Details Name: Alvin Hudson MRN: [id number] DOB: 08/18/1935   Cancelled treatment:        Had initially scheduled MBS at 1400 today. Pt is on continuous EEG, seizures had increased and had sedating meds. Cancel MBS for today and will monitor pt's status   [name] 03/15/2018, 8:37 AM   [name] [name] Nurse, children's [phone] Office [phone]

## 2018-03-15 NOTE — Progress Notes (Signed)
MD notified about seizure like activity. New order of one time dose 2 mg ativan given. MD wanted depacon more time to kick in before adding a third IV piggy back anti-seizure medication. Will continue to monitor.

## 2018-03-15 NOTE — Procedures (Signed)
Electroencephalogram report- LTM  Ordering Physician : Dr. Roda Shutters    Beginning date or time: 03/14/2008 11:47 hours Ending date or time:03/15/2018 11:00 hours  Day of study: 2  Medications include: Per EMR  MENTAL STATUS (per technician's notes): Obtunded  HISTORY: This 24 hours of intensive EEG monitoring with simultaneous video monitoring was performed for this patient with Subdural hemorrhage and altered mental status. This EEG was requested to rule out subclinical electrographic seizures.  TECHNICAL DESCRIPTION:  The study consists of a continuous 16-channel multi-montage digital video EEG recording with twenty-one electrodes placed according to the International 10-20 System. Additional leads included eye leads, true temporal leads (T1, T2), and an EKG lead. Activation procedures were not done due to mental status.  REPORT: The background activity in this tracing consisted of polymorphic delta and theta background, with no clear posterior dominant rhythm. The activity seemed to be reactive to tactile stimuli.  There was overriding beta activity seen in the right hemispheric region.  Low voltage polymorphic delta activity was prominent in the left hemispheric region.  Occasionally, low voltage sharp waves were seen in the left parieto-occipital region.  Intermittently (2303, 2356, 0223, 1031 hrs.), periodic sharp waves were seen in the left parietotemporal region, that further increase in frequency, evolved further into rhythmic delta, theta activity with intermixed sharps, consistent with electrographic seizures.  The video was not available during these times to check for clinical accompaniment.  IMPRESSION: This is an abnormal EEG due to: Electrographic seizures starting from the left parietotemporal region, that further evolve in the left hemisphere Focal slowing in the left hemispheric region Diffuse slowing.  CLINICAL CORRELATION: This EEG is consistent with focal neuronal dysfunction  with intermittent electrographic seizures in the left hemispheric region.  There is also superimposed nonspecific diffuse cerebral dysfunction.

## 2018-03-15 NOTE — Progress Notes (Signed)
Received phone call from Dr. Thedore Mins and RN for multiple seizure activities in LTM EEG and at bedside. The last one was at 12:40pm just finished keppra. He also finished the depakote dose 10:30am. Seems like pt seizure is refractory now. Will start vimpat 200mg  bid. Continue LTM EEG monitoring  Marvel Plan, MD PhD Stroke Neurology 03/15/2018 12:52 PM

## 2018-03-15 NOTE — Progress Notes (Addendum)
STROKE TEAM PROGRESS NOTE   SUBJECTIVE (INTERVAL HISTORY) His wife is at bedside. Pt had two clinical seizures yesterday afternoon. Was loaded with keppra and increased keppra dose to 1500. LTM EEG hooked back. However, he continued to have two seizures overnight and was put on depakote after load. This morning, he is very drowsy, difficulty to arouse, not talking, still has right arm flaccid. Also had brief partial seizure activity lasted about 30sec during round. Currently BP and UOP improved.    OBJECTIVE Temp:  [98.5 F (36.9 C)-99.1 F (37.3 C)] 98.8 F (37.1 C) (02/19 0700) Pulse Rate:  [51-96] 80 (02/19 1000) Cardiac Rhythm: Heart block (02/19 0400) Resp:  [10-33] 10 (02/19 1000) BP: (94-147)/(65-88) 139/78 (02/19 1000) SpO2:  [90 %-99 %] 94 % (02/19 1000)  Recent Labs  Lab 03/10/18 0656 03/12/18 2022  GLUCAP 95 129*   Recent Labs  Lab 03/10/18 0659 03/10/18 1719 03/12/18 2001 03/14/18 0422 03/15/18 0453  NA 134* 138 131* 134* 136  K 3.5 3.3* 3.7 3.6 5.4*  CL 99  --  97* 99 106  CO2 29  --  26 20* 23  GLUCOSE 113*  --  138* 116* 113*  BUN 10  --  8 11 5*  CREATININE 0.66  --  0.75 0.74 0.60*  CALCIUM 9.1  --  9.0 8.7* 7.7*   Recent Labs  Lab 03/12/18 2001  AST 40  ALT 32  ALKPHOS 49  BILITOT 1.8*  PROT 6.3*  ALBUMIN 3.5   Recent Labs  Lab 03/10/18 0659 03/10/18 1719 03/12/18 2001 03/14/18 0422 03/15/18 0453  WBC 9.3  --  13.8* 12.9* 10.2  NEUTROABS  --   --  10.3*  --   --   HGB 13.3 9.2* 12.0* 11.0* 9.5*  HCT 39.9 27.0* 36.4* 33.0* 29.0*  MCV 106.7*  --  105.8* 103.8* 106.2*  PLT 218  --  252 240 196   Recent Labs  Lab 03/10/18 0659 03/14/18 0435 03/14/18 1720  TROPONINI 0.04* 0.15* 0.10*   Recent Labs    03/12/18 2001  LABPROT 14.1  INR 1.10   Recent Labs    03/13/18 2003  COLORURINE AMBER*  LABSPEC 1.032*  PHURINE 6.0  GLUCOSEU NEGATIVE  HGBUR SMALL*  BILIRUBINUR NEGATIVE  KETONESUR 80*  PROTEINUR 30*  NITRITE NEGATIVE   LEUKOCYTESUR NEGATIVE    No results found for: CHOL, TRIG, HDL, CHOLHDL, VLDL, LDLCALC No results found for: HGBA1C No results found for: LABOPIA, COCAINSCRNUR, LABBENZ, AMPHETMU, THCU, LABBARB  No results for input(s): ETH in the last 168 hours.  I have personally reviewed the radiological images below and agree with the radiology interpretations.  Ct Head Wo Contrast  Result Date: 03/12/2018 CLINICAL DATA:  Possible stroke, right-sided weakness with incomprehensible speech. History of seizures. EXAM: CT HEAD WITHOUT CONTRAST TECHNIQUE: Contiguous axial images were obtained from the base of the skull through the vertex without intravenous contrast. COMPARISON:  03/10/2018 FINDINGS: Brain: Redistribution of postop blood products over the left frontal and parietal convexities current exam. There is redemonstration of a small amount postoperative air overlying the left cerebral convexity. No significant change in the appearance of brain is otherwise noted. No significant mass effect or midline shift. Chronic small vessel ischemic disease of periventricular white matter. No herniation. Midline ventricle basal cisterns without effacement. Brainstem and cerebellum are nonacute. Vascular: Atherosclerosis of the carotid siphons. No hyperdense vessel sign. Skull: Postop change from left lateral craniotomy with overlying scalp soft tissue emphysema and residual blood products  with overlying skin staples. Sinuses/Orbits: Intact orbits and globes. Clear paranasal sinuses. Other: Left periorbital and malar soft tissue swelling. IMPRESSION: 1. Redistribution of postop blood products over the left frontal and parietal convexities on current exam. No significant mass effect or midline shift. No acute intracranial appearing abnormality. 2. Postop change from left lateral craniotomy with overlying scalp soft tissue emphysema and residual blood products. 3. Left periorbital and malar soft tissue swelling. Electronically  Signed   By: Tollie Eth M.D.   On: 03/12/2018 20:52   Ct Head Wo Contrast  Result Date: 03/10/2018 CLINICAL DATA:  83 y/o  M; subdural hemorrhage for follow-up. EXAM: CT HEAD WITHOUT CONTRAST TECHNIQUE: Contiguous axial images were obtained from the base of the skull through the vertex without intravenous contrast. COMPARISON:  03/10/2018 CT head. FINDINGS: Brain: Interval left lateral craniotomy for evacuation of subdural hematoma. There is a small postoperative air and fluid-filled collection overlying the left cerebral convexity with minimal residual blood products. There is an interval decrease of mass effect with improved patency of the left lateral ventricle and near complete resolution of left to right midline shift. No new intracranial hemorrhage, stroke, mass lesion, hydrocephalus, or herniation is identified. Vascular: Calcific atherosclerosis of carotid siphons. No hyperdense vessel. Skull: Postsurgical changes related to a left lateral craniotomy with air and edema in the overlying scalp and a scalp drain noted. Sinuses/Orbits: Mild mucosal thickening of the maxillary sinuses and the left posterior ethmoid air cells. Normal aeration of the mastoid air cells. Orbits are unremarkable. Other: None. IMPRESSION: 1. Interval left lateral craniotomy with expected postsurgical changes. 2. Small postoperative air and fluid-filled collection overlying the left cerebral convexity with minimal residual blood products. 3. Decreased mass effect with improved patency of the left lateral ventricle and near complete resolution of left to right midline shift. 4. No new intracranial abnormality identified. Electronically Signed   By: Mitzi Hansen M.D.   On: 03/10/2018 22:00   Ct Head Wo Contrast  Result Date: 03/10/2018 CLINICAL DATA:  Aphasic.  Recent fall EXAM: CT HEAD WITHOUT CONTRAST TECHNIQUE: Contiguous axial images were obtained from the base of the skull through the vertex without intravenous  contrast. COMPARISON:  March 01, 2018 FINDINGS: Brain: There is an acute left-sided subdural hematoma with involvement primarily in the left temporal region but also to a lesser extent in the left frontal region. There is a maximum thickness of this subdural hematoma 1.5 cm. There is mass effect on the superior left temporal lobe and posterior most aspect of the left frontal lobe. There is 3 mm of midline shift to the right. There is mild underlying atrophy. No intra-axial mass or intra-axial hemorrhage noted. No other extra-axial fluid collections are identified. There is slight small vessel disease in the centra semiovale bilaterally. No evident acute infarct. Vascular: No hyperdense vessels. There is calcification in each carotid siphon region. Skull: There is a subtle linear fracture in the left temporal bone. Bony calvarium elsewhere appears intact. Sinuses/Orbits: There is mucosal thickening in several ethmoid air cells. Other visualized paranasal sinuses are clear. Visualized orbits appear symmetric bilaterally. Other: Mastoid air cells are clear. IMPRESSION: Left-sided subdural hematoma, acute, with a maximum thickness of 1.5 cm. This subdural hematoma primarily involves the left temporal region but also involves the posterior left frontal region. There is mass effect on the left temporal lobe laterally with 3 mm of midline shift toward the right. No intra-axial hemorrhage or mass. Slight periventricular small vessel disease. Nondisplaced fracture left temporal bone.  Mucosal thickening in several ethmoid air cells. Critical Value/emergent results were called by telephone at the time of interpretation on 03/10/2018 at 7:48 am to the covering ED department physician, who verbally acknowledged these results. Electronically Signed   By: Bretta Bang III M.D.   On: 03/10/2018 07:49   Ct Head Wo Contrast  Result Date: 03/01/2018 CLINICAL DATA:  Fall.  Headache EXAM: CT HEAD WITHOUT CONTRAST CT CERVICAL  SPINE WITHOUT CONTRAST TECHNIQUE: Multidetector CT imaging of the head and cervical spine was performed following the standard protocol without intravenous contrast. Multiplanar CT image reconstructions of the cervical spine were also generated. COMPARISON:  None. FINDINGS: CT HEAD FINDINGS Brain: Generalized atrophy without hydrocephalus. Negative for acute infarct, hemorrhage, or mass lesion. Vascular: Negative for hyperdense vessel Skull: Negative foot Sinuses/Orbits: Negative Other: None CT CERVICAL SPINE FINDINGS Alignment: Mild anterolisthesis C2-3. Mild retrolisthesis C3-4, C4-5, C5-6, and C6-7. Skull base and vertebrae: Negative for fracture Soft tissues and spinal canal: Negative Disc levels: Advanced disc degeneration and spurring C3 through C7. Multilevel spinal and foraminal stenosis due to spurring. Upper chest: Negative Other: None IMPRESSION: 1. No acute intracranial abnormality 2. Advanced cervical spondylosis.  Negative for fracture. Electronically Signed   By: Marlan Palau M.D.   On: 03/01/2018 16:33   Ct Cervical Spine Wo Contrast  Result Date: 03/01/2018 CLINICAL DATA:  Fall.  Headache EXAM: CT HEAD WITHOUT CONTRAST CT CERVICAL SPINE WITHOUT CONTRAST TECHNIQUE: Multidetector CT imaging of the head and cervical spine was performed following the standard protocol without intravenous contrast. Multiplanar CT image reconstructions of the cervical spine were also generated. COMPARISON:  None. FINDINGS: CT HEAD FINDINGS Brain: Generalized atrophy without hydrocephalus. Negative for acute infarct, hemorrhage, or mass lesion. Vascular: Negative for hyperdense vessel Skull: Negative foot Sinuses/Orbits: Negative Other: None CT CERVICAL SPINE FINDINGS Alignment: Mild anterolisthesis C2-3. Mild retrolisthesis C3-4, C4-5, C5-6, and C6-7. Skull base and vertebrae: Negative for fracture Soft tissues and spinal canal: Negative Disc levels: Advanced disc degeneration and spurring C3 through C7. Multilevel  spinal and foraminal stenosis due to spurring. Upper chest: Negative Other: None IMPRESSION: 1. No acute intracranial abnormality 2. Advanced cervical spondylosis.  Negative for fracture. Electronically Signed   By: Marlan Palau M.D.   On: 03/01/2018 16:33   Mr Maxine Glenn Head Wo Contrast  Result Date: 03/13/2018 CLINICAL DATA:  Altered mental status EXAM: MRI HEAD WITHOUT CONTRAST MRA HEAD WITHOUT CONTRAST TECHNIQUE: Multiplanar, multiecho pulse sequences of the brain and surrounding structures were obtained without intravenous contrast. Angiographic images of the head were obtained using MRA technique without contrast. COMPARISON:  Head CT 03/12/2018 FINDINGS: MRI HEAD FINDINGS BRAIN: There are extra-axial blood products over the left convexity. Multiple tiny foci of cortical diffusion weighted abnormality likely due to the presence of nearby blood. No acute infarct is identified. The left convexity collection measures up to 7 mm in thickness. The midline structures are normal. There are no old infarcts. The white matter signal is normal for the patient's age. Generalized atrophy without lobar predilection. Susceptibility-sensitive sequences show no chronic microhemorrhage or superficial siderosis. SKULL AND UPPER CERVICAL SPINE: Status post left pterional craniotomy. SINUSES/ORBITS: No fluid levels or advanced mucosal thickening. No mastoid or middle ear effusion. The orbits are normal. MRA HEAD FINDINGS POSTERIOR CIRCULATION: --Basilar artery: Normal. --Posterior cerebral arteries: Normal. Both originate from the basilar artery. --Superior cerebellar arteries: Normal. --Inferior cerebellar arteries: Normal anterior and posterior inferior cerebellar arteries. ANTERIOR CIRCULATION: --Intracranial internal carotid arteries: Normal. --Anterior cerebral arteries: Normal. Both A1 segments  are present. Patent anterior communicating artery. --Middle cerebral arteries: Multifocal moderate-to-severe atherosclerotic  narrowing of the M2 branches. Unremarkable right MCA. --Posterior communicating arteries: Absent bilaterally. IMPRESSION: 1. 7 mm extra-axial hematoma over the left convexity, as previously demonstrated on earlier head CT. No new site of hemorrhage. No acute ischemia. 2. 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 No intracranial large vessel occlusion. Electronically Signed   By: Deatra RobinsonKevin  Herman M.D.   On: 03/13/2018 17:48   Mr Laqueta JeanBrain W JWWo Contrast  Result Date: 03/13/2018 CLINICAL DATA:  Altered mental status EXAM: MRI HEAD WITHOUT CONTRAST MRA HEAD WITHOUT CONTRAST TECHNIQUE: Multiplanar, multiecho pulse sequences of the brain and surrounding structures were obtained without intravenous contrast. Angiographic images of the head were obtained using MRA technique without contrast. COMPARISON:  Head CT 03/12/2018 FINDINGS: MRI HEAD FINDINGS BRAIN: There are extra-axial blood products over the left convexity. Multiple tiny foci of cortical diffusion weighted abnormality likely due to the presence of nearby blood. No acute infarct is identified. The left convexity collection measures up to 7 mm in thickness. The midline structures are normal. There are no old infarcts. The white matter signal is normal for the patient's age. Generalized atrophy without lobar predilection. Susceptibility-sensitive sequences show no chronic microhemorrhage or superficial siderosis. SKULL AND UPPER CERVICAL SPINE: Status post left pterional craniotomy. SINUSES/ORBITS: No fluid levels or advanced mucosal thickening. No mastoid or middle ear effusion. The orbits are normal. MRA HEAD FINDINGS POSTERIOR CIRCULATION: --Basilar artery: Normal. --Posterior cerebral arteries: Normal. Both originate from the basilar artery. --Superior cerebellar arteries: Normal. --Inferior cerebellar arteries: Normal anterior and posterior inferior cerebellar arteries. ANTERIOR CIRCULATION: --Intracranial internal  carotid arteries: Normal. --Anterior cerebral arteries: Normal. Both A1 segments are present. Patent anterior communicating artery. --Middle cerebral arteries: Multifocal moderate-to-severe atherosclerotic narrowing of the M2 branches. Unremarkable right MCA. --Posterior communicating arteries: Absent bilaterally. IMPRESSION: 1. 7 mm extra-axial hematoma over the left convexity, as previously demonstrated on earlier head CT. No new site of hemorrhage. No acute ischemia. 2. 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 No intracranial large vessel occlusion. Electronically Signed   By: Deatra RobinsonKevin  Herman M.D.   On: 03/13/2018 17:48   Dg Chest Port 1 View  Result Date: 03/14/2018 CLINICAL DATA:  83 year old male with fever for 1 day. Altered mental status. EXAM: PORTABLE CHEST 1 VIEW COMPARISON:  No prior chest imaging. FINDINGS: Portable AP supine view at 2129 hours. Cardiomegaly. Tortuous thoracic aorta. Other mediastinal contours are within normal limits. Visualized tracheal air column is within normal limits. Allowing for portable technique the lungs are clear. No pneumothorax or pleural effusion is evident. No acute osseous abnormality identified. IMPRESSION: Cardiomegaly. No acute cardiopulmonary abnormality. Electronically Signed   By: Odessa FlemingH  Hall M.D.   On: 03/14/2018 00:55   EEG 2/17-2/18 This EEG is consistent with focal neuronal dysfunction in the left hemispheric region, with superimposed nonspecific diffuse cerebral dysfunction. No electrographic seizures were seen.  EEG 2/18-2/19 pending   PHYSICAL EXAM  Temp:  [98.5 F (36.9 C)-99.1 F (37.3 C)] 98.8 F (37.1 C) (02/19 0700) Pulse Rate:  [51-96] 80 (02/19 1000) Resp:  [10-33] 10 (02/19 1000) BP: (94-147)/(65-88) 139/78 (02/19 1000) SpO2:  [90 %-99 %] 94 % (02/19 1000)  General - well nourished, well developed, drowsy and hard to arouse.    Ophthalmologic - fundi not visualized due to  noncooperation.    Cardiovascular - regular rate and rhythm, not in afib at this time  Neuro -  drowsy and difficult to be aroused, no language output, not following commands.  Neck supple, no meningismus sign.  PERRL, eye midline, doll's eye present.  Not consistent with visual threat testing.  Right facial droop, tongue midline in mouth.  Left upper and bilateral lower extremities spontaneous movement and equal strength, right upper extremity flaccid, mild withdraw with pain stimulation.  DTR 1+, no Babinski. Sensation, coordination and gait not tested.   ASSESSMENT/PLAN Alvin Hudson is a 83 y.o. male with history of seizure on Keppra and MCI following with Dr. Rulon Eisenmenger at [facility] admitted for temporoparietal SDH. Underwent surgical evacuation by Dr. Lovell Sheehan and repeat CT post op showed decreased mass-effect and midline shift.  He was discharged but then readmitted due to acute onset altered mental status, global aphasia, and right arm weakness. Repeat CT showed no acute changes.    Left hemisphere seizure  5 clinical seizures since 03/14/18 s/p ativan  On LTM EEG  On keppra 750mg  bid home dose - now increased to 1500mg  bid after loading  On depakote 15mg /kg/day divided in 3 doses   depakote level daily  Seizure precautions  Left M2 high grade stenosis - ? Vasospasm   MRI no acute infarct or abscess  CT and MRI showed SDH improved after evacuation  MRA showed severe stenosis left M2 both branches with decreased distal filling.   No previous MRA or CTA to compare  No good explanation for vasospasm - no SAH this time  BP and UOP improved  Recommend IVF and keep SBP 130-150 to improve perfusion  May consider CTA head and neck once stable  afib RVR  New diagnosis  On cardizem drip  Cardiology on board  Avoid low BP  Consider wean off IV once po access  SDH s/p evacuation  CT and MRI post op showed good evacuation  SDH near resolved   Low grade fever,  improving  Tmax 100.3->afebrile  WBC 13.8->12.9->10.2  UA neg  CXR unremarkable  Blood culture NGTD  Ammonia level normal  Dysphagia   Did not pass swallow  Speech on board  Need cortrak for nutrition and po access  Hospital day # 3  This patient is critically ill due to aphasia and right arm plegia, seizure, left MCA stenosis and at significant risk of neurological worsening, death form stroke, status epilepticus, SDH expansion, brain herniation. This patient's care requires constant monitoring of vital signs, hemodynamics, respiratory and cardiac monitoring, review of multiple databases, neurological assessment, discussion with family, other specialists and medical decision making of high complexity. I spent 40 minutes of neurocritical care time in the care of this patient. I had long discussion with wife at bedside, updated pt current condition, treatment plan and potential prognosis. She expressed understanding and appreciation.  Marvel Plan, MD PhD Stroke Neurology 03/15/2018 10:46 AM    To contact Stroke Continuity provider, please refer to [email or web address]. After hours, contact General Neurology

## 2018-03-16 DIAGNOSIS — I421 Obstructive hypertrophic cardiomyopathy: Secondary | ICD-10-CM

## 2018-03-16 DIAGNOSIS — I4811 Longstanding persistent atrial fibrillation: Secondary | ICD-10-CM

## 2018-03-16 DIAGNOSIS — I5032 Chronic diastolic (congestive) heart failure: Secondary | ICD-10-CM

## 2018-03-16 DIAGNOSIS — R7989 Other specified abnormal findings of blood chemistry: Secondary | ICD-10-CM

## 2018-03-16 DIAGNOSIS — I679 Cerebrovascular disease, unspecified: Secondary | ICD-10-CM

## 2018-03-16 LAB — CBC
HCT: 33.3 % — ABNORMAL LOW (ref 39.0–52.0)
Hemoglobin: 11.1 g/dL — ABNORMAL LOW (ref 13.0–17.0)
MCH: 34.9 pg — ABNORMAL HIGH (ref 26.0–34.0)
MCHC: 33.3 g/dL (ref 30.0–36.0)
MCV: 104.7 fL — ABNORMAL HIGH (ref 80.0–100.0)
NRBC: 0 % (ref 0.0–0.2)
Platelets: 254 10*3/uL (ref 150–400)
RBC: 3.18 MIL/uL — ABNORMAL LOW (ref 4.22–5.81)
RDW: 13 % (ref 11.5–15.5)
WBC: 9.3 10*3/uL (ref 4.0–10.5)

## 2018-03-16 LAB — MAGNESIUM
Magnesium: 1.8 mg/dL (ref 1.7–2.4)
Magnesium: 1.9 mg/dL (ref 1.7–2.4)

## 2018-03-16 LAB — VALPROIC ACID LEVEL: Valproic Acid Lvl: 62 ug/mL (ref 50.0–100.0)

## 2018-03-16 LAB — BASIC METABOLIC PANEL
Anion gap: 9 (ref 5–15)
BUN: 7 mg/dL — ABNORMAL LOW (ref 8–23)
CO2: 26 mmol/L (ref 22–32)
Calcium: 8.3 mg/dL — ABNORMAL LOW (ref 8.9–10.3)
Chloride: 101 mmol/L (ref 98–111)
Creatinine, Ser: 0.71 mg/dL (ref 0.61–1.24)
GFR calc Af Amer: >60 mL/min (ref >60)
GFR calc non Af Amer: >60 mL/min (ref >60)
Glucose, Bld: 140 mg/dL — ABNORMAL HIGH (ref 70–99)
Potassium: 3.3 mmol/L — ABNORMAL LOW (ref 3.5–5.1)
Sodium: 136 mmol/L (ref 135–145)

## 2018-03-16 LAB — GLUCOSE, CAPILLARY
GLUCOSE-CAPILLARY: 123 mg/dL — AB (ref 70–99)
Glucose-Capillary: 123 mg/dL — ABNORMAL HIGH (ref 70–99)
Glucose-Capillary: 128 mg/dL — ABNORMAL HIGH (ref 70–99)
Glucose-Capillary: 116 mg/dL — ABNORMAL HIGH (ref 70–99)
Glucose-Capillary: 124 mg/dL — ABNORMAL HIGH (ref 70–99)
Glucose-Capillary: 124 mg/dL — ABNORMAL HIGH (ref 70–99)

## 2018-03-16 LAB — PHOSPHORUS
Phosphorus: 2.1 mg/dL — ABNORMAL LOW (ref 2.5–4.6)
Phosphorus: 2.7 mg/dL (ref 2.5–4.6)

## 2018-03-16 MED ORDER — LEVETIRACETAM 750 MG PO TABS
1500.0000 mg | ORAL_TABLET | Freq: Two times a day (BID) | ORAL | Status: DC
  Filled 2018-03-16: qty 2

## 2018-03-16 MED ORDER — LEVETIRACETAM 100 MG/ML PO SOLN
1500.0000 mg | Freq: Two times a day (BID) | ORAL | Status: DC
  Administered 2018-03-16 – 2018-03-20 (×9): 1500 mg via ORAL
  Filled 2018-03-16 (×9): qty 15

## 2018-03-16 MED ORDER — DILTIAZEM HCL-DEXTROSE 100-5 MG/100ML-% IV SOLN (PREMIX): 5.0000 mg/h | INTRAVENOUS | Status: AC

## 2018-03-16 MED ORDER — POTASSIUM CHLORIDE 20 MEQ/15ML (10%) PO SOLN
40.0000 meq | ORAL | Status: AC
  Administered 2018-03-16 (×2): 40 meq via ORAL
  Filled 2018-03-16 (×2): qty 30

## 2018-03-16 MED ORDER — DILTIAZEM HCL 60 MG PO TABS
60.0000 mg | ORAL_TABLET | Freq: Four times a day (QID) | ORAL | Status: DC
  Administered 2018-03-16 – 2018-03-20 (×16): 60 mg via NASOGASTRIC
  Filled 2018-03-16 (×17): qty 1

## 2018-03-16 MED ORDER — DIVALPROEX SODIUM 500 MG PO DR TAB
500.0000 mg | DELAYED_RELEASE_TABLET | Freq: Three times a day (TID) | ORAL | Status: DC
  Administered 2018-03-16 – 2018-03-18 (×7): 500 mg via ORAL
  Filled 2018-03-16 (×9): qty 1

## 2018-03-16 MED ORDER — LACOSAMIDE 200 MG PO TABS
200.0000 mg | ORAL_TABLET | Freq: Two times a day (BID) | ORAL | Status: DC
  Administered 2018-03-16 – 2018-03-20 (×8): 200 mg via ORAL
  Filled 2018-03-16 (×8): qty 1

## 2018-03-16 NOTE — Progress Notes (Signed)
Neurosurgery Service Progress Note  Subjective: No clinical seizures overnight reported  Objective: Vitals:   03/16/18 0300 03/16/18 0400 03/16/18 0500 03/16/18 0600  BP: 130/76 133/66 126/76 117/77  Pulse: 75 77 73 72  Resp: 20 (!) 22 20 (!) 21  Temp:  99.3 F (37.4 C)    TempSrc:  Axillary    SpO2: 98% 97% 97% 98%   Temp (24hrs), Avg:98.9 F (37.2 C), Min:98.2 F (36.8 C), Max:99.3 F (37.4 C)  CBC Latest Ref Rng & Units 03/16/2018 03/15/2018 03/14/2018  WBC 4.0 - 10.5 K/uL 9.3 10.2 12.9(H)  Hemoglobin 13.0 - 17.0 g/dL 11.1(L) 9.5(L) 11.0(L)  Hematocrit 39.0 - 52.0 % 33.3(L) 29.0(L) 33.0(L)  Platelets 150 - 400 K/uL 254 196 240   BMP Latest Ref Rng & Units 03/15/2018 03/14/2018 03/12/2018  Glucose 70 - 99 mg/dL 809(X) 833(A) 250(N)  BUN 8 - 23 mg/dL 5(L) 11 8  Creatinine 3.97 - 1.24 mg/dL 6.73(A) 1.93 7.90  Sodium 135 - 145 mmol/L 136 134(L) 131(L)  Potassium 3.5 - 5.1 mmol/L 5.4(H) 3.6 3.7  Chloride 98 - 111 mmol/L 106 99 97(L)  CO2 22 - 32 mmol/L 23 20(L) 26  Calcium 8.9 - 10.3 mg/dL 7.7(L) 8.7(L) 9.0    Intake/Output Summary (Last 24 hours) at 03/16/2018 0823 Last data filed at 03/16/2018 0600 Gross per 24 hour  Intake 3081.99 ml  Output 2300 ml  Net 781.99 ml    Current Facility-Administered Medications:  .  0.9 %  sodium chloride infusion, , Intravenous, Continuous, Meyran, Tiana Loft, NP, Stopped at 03/14/18 0830 .  0.9 %  sodium chloride infusion, , Intravenous, Continuous, Tera Mater, RPH, Last Rate: 100 mL/hr at 03/16/18 0600 .  acetaminophen (TYLENOL) tablet 650 mg, 650 mg, Oral, Q4H PRN **OR** acetaminophen (TYLENOL) suppository 650 mg, 650 mg, Rectal, Q4H PRN, Jadene Pierini, MD, 650 mg at 03/14/18 1232 .  atorvastatin (LIPITOR) tablet 20 mg, 20 mg, Oral, Daily, Edward Guthmiller A, MD .  B-complex with vitamin C tablet 1 tablet, 1 tablet, Oral, Daily, Tressie Stalker, MD .  chlorhexidine (PERIDEX) 0.12 % solution 15 mL, 15 mL, Mouth  Rinse, BID, Meyran, Tiana Loft, NP, 15 mL at 03/15/18 2209 .  diltiazem (CARDIZEM) 100 mg in dextrose 5% (1 mg/mL) infusion, 5-15 mg/hr, Intravenous, Titrated, Marvel Plan, MD, Last Rate: 10 mL/hr at 03/16/18 0600, 10 mg/hr at 03/16/18 0600 .  docusate sodium (COLACE) capsule 100 mg, 100 mg, Oral, BID, Michelle Wnek A, MD .  feeding supplement (OSMOLITE 1.2 CAL) liquid 1,000 mL, 1,000 mL, Per Tube, Continuous, Marvel Plan, MD, Last Rate: 60 mL/hr at 03/15/18 1544, 1,000 mL at 03/15/18 1544 .  feeding supplement (PRO-STAT SUGAR FREE 64) liquid 30 mL, 30 mL, Per Tube, Daily, Marvel Plan, MD, 30 mL at 03/15/18 1544 .  haloperidol lactate (HALDOL) injection 2 mg, 2 mg, Intravenous, Q6H PRN, Marvel Plan, MD, 2 mg at 03/13/18 1517 .  HYDROcodone-acetaminophen (NORCO/VICODIN) 5-325 MG per tablet 1 tablet, 1 tablet, Oral, Q4H PRN, Lord Lancour A, MD .  labetalol (NORMODYNE,TRANDATE) injection 10-40 mg, 10-40 mg, Intravenous, Q10 min PRN, Jadene Pierini, MD .  lacosamide (VIMPAT) 200 mg in sodium chloride 0.9 % 25 mL IVPB, 200 mg, Intravenous, Q12H, Marvel Plan, MD, Stopped at 03/15/18 2148 .  levETIRAcetam (KEPPRA) IVPB 1500 mg/ 100 mL premix, 1,500 mg, Intravenous, Q12H, Marvel Plan, MD, Stopped at 03/15/18 2355 .  MEDLINE mouth rinse, 15 mL, Mouth Rinse, q12n4p, Meyran, Tiana Loft, NP, 15 mL at  03/15/18 1545 .  ondansetron (ZOFRAN) tablet 4 mg, 4 mg, Oral, Q4H PRN **OR** ondansetron (ZOFRAN) injection 4 mg, 4 mg, Intravenous, Q4H PRN, Johnattan Strassman, Clovis Pu, MD .  polycarbophil (FIBERCON) tablet 625 mg, 625 mg, Oral, Daily, Zaeden Lastinger A, MD .  promethazine (PHENERGAN) tablet 12.5-25 mg, 12.5-25 mg, Oral, Q4H PRN, Jadene Pierini, MD .  sertraline (ZOLOFT) tablet 100 mg, 100 mg, Oral, Daily, Ahaana Rochette A, MD .  temazepam (RESTORIL) capsule 15 mg, 15 mg, Oral, QHS, Madisun Hargrove A, MD .  valproate (DEPACON) 356 mg in dextrose 5 % 50 mL IVPB, 15 mg/kg/day,  Intravenous, Q8H, Caryl Pina, MD, Last Rate: 53.6 mL/hr at 03/16/18 0600   Physical Exam: Awake/alert, left sided preference, does not pantomime well, globally aphasic, but trying to get out of bed using all 4 extremities  Assessment & Plan: 83 y.o. man s/p evacuation of L SDH with good recovery post-op then development of aphasia and R sided weakness. 2/19 early AM clinical seizure, loaded w/ VPA, 2/20 multiple electrographic Sz over 24h, added vimpat -defer Sz management to neurology -discussed with wife at bedside, given that he is on 3 agents, I warned her that his seizure frequency might get worse and require intubation before it gets better, but that his deficits will likely be reversible after his seizures stop  Jadene Pierini  03/16/18 8:23 AM

## 2018-03-16 NOTE — Progress Notes (Signed)
  Speech Language Pathology Treatment: Dysphagia;Cognitive-Linquistic  Hudson Details Name: Alvin Hudson MRN: [id number] DOB: 12-15-1935 Today's Date: 03/16/2018 Time: 1211-1232 SLP Time Calculation (min) (ACUTE ONLY): 21 min  Assessment / Plan / Recommendation Clinical Impression  Alvin Hudson easily awoke from sleep and stay awake during speech session focusing on language and swallow facilitation. He participated in self care with mod-max tactile cues to initiate washing face; did not wipe nose or mouth with auditory cues. SLP told pt it "is supposed to snow today" and pt immediately turned head to look out his window (no visual cues given from therapist). Was unable to follow commands with auditory information. Pt verbalizing today with low vocal intensity, all neologisms without apparent awareness to errors.   Following oral care, several ice chip trials given with mild oral manipulation and swallowed without outward s/s aspiration although minimal amount consumed. He is not ready for instrumental assessment and has Cortrak for nutrition. Pt's wife and daughter present and educated re: progress/plan prior at outset of session and they left before session ended.    HPI HPI: Alvin Hudson is an 83 year old white male who underwent craniotomy 2/14 following large acute left subdural hematoma and discharged 2/16. Presented to ED evening of 2/16 due to difficulty with speech and moving right side. MD suspected he had a prolonged Todd's paralysis after a seisure. CXR Cardiomegaly. No acute cardiopulmonary abnormality.      SLP Plan  Continue with current plan of care       Recommendations  Diet recommendations: NPO Medication Administration: Via alternative means                Oral Care Recommendations: Oral care QID Follow up Recommendations: Other (comment)(SNF vs CIR) SLP Visit Diagnosis: Dysphagia, unspecified (R13.10);Cognitive communication deficit (Q65.784) Plan: Continue with  current plan of care                       [name] 03/16/2018, 1:50 PM    [name] [name] Nurse, children's [phone] Office [phone]

## 2018-03-16 NOTE — Progress Notes (Signed)
Progress Note  Patient Name: Alvin Hudson Date of Encounter: 03/16/2018  Primary Cardiologist: No primary care provider on file.  None  Subjective   More alert today but not communicative.  Family is in the room.  Discussed his cardiac issues with the wife.  We spoke about the risk of stroke in absence of anticoagulation therapy.  Also discussed the identified LV outflow tract obstruction by echo.  He also has mild calcific aortic stenosis.  Elevated troponins are felt related to demand ischemia and require no further work-up.  Inpatient Medications    Scheduled Meds: . atorvastatin  20 mg Oral Daily  . B-complex with vitamin C  1 tablet Oral Daily  . chlorhexidine  15 mL Mouth Rinse BID  . docusate sodium  100 mg Oral BID  . feeding supplement (PRO-STAT SUGAR FREE 64)  30 mL Per Tube Daily  . mouth rinse  15 mL Mouth Rinse q12n4p  . polycarbophil  625 mg Oral Daily  . sertraline  100 mg Oral Daily  . temazepam  15 mg Oral QHS   Continuous Infusions: . sodium chloride Stopped (03/14/18 0830)  . sodium chloride 100 mL/hr at 03/16/18 0827  . diltiazem (CARDIZEM) infusion 10 mg/hr (03/16/18 0800)  . feeding supplement (OSMOLITE 1.2 CAL) 1,000 mL (03/15/18 1544)  . lacosamide (VIMPAT) IV 200 mg (03/16/18 0942)  . levETIRAcetam Stopped (03/15/18 2355)  . valproate sodium Stopped (03/16/18 1610)   PRN Meds: acetaminophen **OR** acetaminophen, haloperidol lactate, HYDROcodone-acetaminophen, labetalol, ondansetron **OR** ondansetron (ZOFRAN) IV, promethazine   Vital Signs    Vitals:   03/16/18 0600 03/16/18 0700 03/16/18 0800 03/16/18 0900  BP: 117/77 115/75 118/79 120/69  Pulse: 72 81 81 (!) 101  Resp: (!) 21 (!) 22 (!) 24 (!) 23  Temp:   98.8 F (37.1 C)   TempSrc:   Oral   SpO2: 98% 96% 95% 94%    Intake/Output Summary (Last 24 hours) at 03/16/2018 0954 Last data filed at 03/16/2018 0800 Gross per 24 hour  Intake 3193.39 ml  Output 2300 ml  Net 893.39 ml   Last 3  Weights 03/10/2018 03/01/2018  Weight (lbs) 157 lb 155 lb  Weight (kg) 71.215 kg 70.308 kg      Telemetry    Atrial fib with controlled rate below 100 bpm on IV amiodarone drip- Personally Reviewed  ECG    The most recent EKG performed on 03/14/2018 revealed left bundle branch block, atrial fib, with rapid ventricular response.- Personally Reviewed  Physical Exam  Extracranial electrodes are affixed to his head.  The patient is awake and will look in the direction of sounds but does not communicate.  He has an nasogastric tube in place. GEN: No acute distress.   Neck: No JVD Cardiac: RRR, no murmurs, rubs, or gallops.  Respiratory: Clear to auscultation bilaterally. GI: Soft, nontender, non-distended  MS:  No obvious abnormality Neuro:   Unable to follow commands. Psych: Confused/non-engaging.  Labs    Chemistry Recent Labs  Lab 03/12/18 2001 03/14/18 0422 03/15/18 0453 03/16/18 0656  NA 131* 134* 136 136  K 3.7 3.6 5.4* 3.3*  CL 97* 99 106 101  CO2 26 20* 23 26  GLUCOSE 138* 116* 113* 140*  BUN 8 11 5* 7*  CREATININE 0.75 0.74 0.60* 0.71  CALCIUM 9.0 8.7* 7.7* 8.3*  PROT 6.3*  --   --   --   ALBUMIN 3.5  --   --   --   AST 40  --   --   --  ALT 32  --   --   --   ALKPHOS 49  --   --   --   BILITOT 1.8*  --   --   --   GFRNONAA >60 >60 >60 >60  GFRAA >60 >60 >60 >60  ANIONGAP 8 15 7 9      Hematology Recent Labs  Lab 03/14/18 0422 03/15/18 0453 03/16/18 0656  WBC 12.9* 10.2 9.3  RBC 3.18* 2.73* 3.18*  HGB 11.0* 9.5* 11.1*  HCT 33.0* 29.0* 33.3*  MCV 103.8* 106.2* 104.7*  MCH 34.6* 34.8* 34.9*  MCHC 33.3 32.8 33.3  RDW 13.1 13.2 13.0  PLT 240 196 254    Cardiac Enzymes Recent Labs  Lab 03/10/18 0659 03/14/18 0435 03/14/18 1720  TROPONINI 0.04* 0.15* 0.10*   No results for input(s): TROPIPOC in the last 168 hours.   BNP Recent Labs  Lab 03/14/18 1720  BNP 702.9*     DDimer No results for input(s): DDIMER in the last 168 hours.    Radiology    No results found.  Cardiac Studies   No new cardiac data other than as noted yesterday with echo showing dynamic LV outflow tract obstruction  Patient Profile     83 y.o. male with a history of seizures on Keppraand known LBBB but no known cardiac historywho is being seen today for the evaluation of atrial fibrillation andelevated troponin.  Assessment & Plan    1. Atrial fibrillation with controlled rate currently.  Some instances of sinus rhythm yesterday.  Will leave orders to begin converting diltiazem to NG dosing and discontinue the drip. 2. Dynamic left ventricular outflow tract obstruction.  Negative inotropic calcium channel blocker such as diltiazem can be well-tolerated in this setting and decrease severity of LVOT obstruction. 3. Elevated CHADS VASC > 2.  Not a candidate for anticoagulation at this time due to intracranial injury and bleeding risk.       For questions or updates, please contact [facility] Please consult [email or web address] for contact info under        Signed, Lesleigh Noe, MD  03/16/2018, 9:54 AM

## 2018-03-16 NOTE — Procedures (Signed)
Electroencephalogram report- LTM  Ordering Physician : Dr. Roda Shutters    Beginning date or time: 03/15/2008 11:00 hours Ending date or time:03/16/2018 11:00 hours  Day of study: 3  Medications include: Per EMR  MENTAL STATUS (per technician's notes): Obtunded  HISTORY: This 24 hours of intensive EEG monitoring with simultaneous video monitoring was performed for this patient with Subdural hemorrhage and altered mental status. This EEG was requested to rule out subclinical electrographic seizures.  TECHNICAL DESCRIPTION:  The study consists of a continuous 16-channel multi-montage digital video EEG recording with twenty-one electrodes placed according to the International 10-20 System. Additional leads included eye leads, true temporal leads (T1, T2), and an EKG lead. Activation procedures were not done due to mental status.  REPORT: The background activity in this tracing consisted of polymorphic delta and theta background, with no clear posterior dominant rhythm. The activity seemed to be reactive to tactile stimuli.  There was overriding beta activity seen in the right hemispheric region.  Low voltage polymorphic delta activity was prominent in the left hemispheric region.  Occasionally, low voltage sharp waves were seen in the left parieto-occipital region.  Intermittently [phone], 2015 hours) periodic sharp waves were seen in the left parietotemporal region, that further increase in frequency, evolved further into rhythmic delta, theta activity with intermixed sharps, consistent with electrographic seizures.  The video showed right upper extremity rhythmic shaking associated with these episodes.  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  There is also superimposed nonspecific diffuse encephalopathy, mildly improved from previous day's recording.  The report was verbally discussed with Dr. Roda Shutters

## 2018-03-16 NOTE — Progress Notes (Signed)
LTM EEG checked, O2, Pz both repreped/glued. No skin breakdown noted.

## 2018-03-16 NOTE — Progress Notes (Signed)
STROKE TEAM PROGRESS NOTE   SUBJECTIVE (INTERVAL HISTORY) His wife and daughters are at bedside. Pt had simple partial seizures 1804, 1856 and 2015 on LTM EEG yesterday. Since then, no more seizures. He is on 3 AEDs now and this morning depakote level 62 after the morning dose. He still has global aphasia and right arm flaccid. Questionable right leg weakness.    OBJECTIVE Temp:  [98.2 F (36.8 C)-99.3 F (37.4 C)] 98.8 F (37.1 C) (02/20 0800) Pulse Rate:  [65-101] 95 (02/20 1000) Cardiac Rhythm: Atrial fibrillation;Heart block (02/20 0800) Resp:  [14-26] 20 (02/20 1000) BP: (101-148)/(66-85) 119/78 (02/20 1000) SpO2:  [66 %-98 %] 95 % (02/20 1000)  Recent Labs  Lab 03/15/18 1535 03/15/18 1939 03/15/18 2330 03/16/18 0316 03/16/18 0730  GLUCAP 92 98 119* 123* 128*   Recent Labs  Lab 03/10/18 0659 03/10/18 1719 03/12/18 2001 03/14/18 0422 03/15/18 0453 03/15/18 1853 03/16/18 0656  NA 134* 138 131* 134* 136  --  136  K 3.5 3.3* 3.7 3.6 5.4*  --  3.3*  CL 99  --  97* 99 106  --  101  CO2 29  --  26 20* 23  --  26  GLUCOSE 113*  --  138* 116* 113*  --  140*  BUN 10  --  8 11 5*  --  7*  CREATININE 0.66  --  0.75 0.74 0.60*  --  0.71  CALCIUM 9.1  --  9.0 8.7* 7.7*  --  8.3*  MG  --   --   --   --   --  1.6* 1.8  PHOS  --   --   --   --   --  1.6* 2.1*   Recent Labs  Lab 03/12/18 2001  AST 40  ALT 32  ALKPHOS 49  BILITOT 1.8*  PROT 6.3*  ALBUMIN 3.5   Recent Labs  Lab 03/10/18 0659 03/10/18 1719 03/12/18 2001 03/14/18 0422 03/15/18 0453 03/16/18 0656  WBC 9.3  --  13.8* 12.9* 10.2 9.3  NEUTROABS  --   --  10.3*  --   --   --   HGB 13.3 9.2* 12.0* 11.0* 9.5* 11.1*  HCT 39.9 27.0* 36.4* 33.0* 29.0* 33.3*  MCV 106.7*  --  105.8* 103.8* 106.2* 104.7*  PLT 218  --  252 240 196 254   Recent Labs  Lab 03/10/18 0659 03/14/18 0435 03/14/18 1720  TROPONINI 0.04* 0.15* 0.10*   No results for input(s): LABPROT, INR in the last 72 hours. Recent Labs     03/13/18 2003  COLORURINE AMBER*  LABSPEC 1.032*  PHURINE 6.0  GLUCOSEU NEGATIVE  HGBUR SMALL*  BILIRUBINUR NEGATIVE  KETONESUR 80*  PROTEINUR 30*  NITRITE NEGATIVE  LEUKOCYTESUR NEGATIVE    No results found for: CHOL, TRIG, HDL, CHOLHDL, VLDL, LDLCALC No results found for: HGBA1C No results found for: LABOPIA, COCAINSCRNUR, LABBENZ, AMPHETMU, THCU, LABBARB  No results for input(s): ETH in the last 168 hours.  I have personally reviewed the radiological images below and agree with the radiology interpretations.  Ct Head Wo Contrast  Result Date: 03/12/2018 CLINICAL DATA:  Possible stroke, right-sided weakness with incomprehensible speech. History of seizures. EXAM: CT HEAD WITHOUT CONTRAST TECHNIQUE: Contiguous axial images were obtained from the base of the skull through the vertex without intravenous contrast. COMPARISON:  03/10/2018 FINDINGS: Brain: Redistribution of postop blood products over the left frontal and parietal convexities current exam. There is redemonstration of a small amount postoperative air overlying  the left cerebral convexity. No significant change in the appearance of brain is otherwise noted. No significant mass effect or midline shift. Chronic small vessel ischemic disease of periventricular white matter. No herniation. Midline ventricle basal cisterns without effacement. Brainstem and cerebellum are nonacute. Vascular: Atherosclerosis of the carotid siphons. No hyperdense vessel sign. Skull: Postop change from left lateral craniotomy with overlying scalp soft tissue emphysema and residual blood products with overlying skin staples. Sinuses/Orbits: Intact orbits and globes. Clear paranasal sinuses. Other: Left periorbital and malar soft tissue swelling. IMPRESSION: 1. Redistribution of postop blood products over the left frontal and parietal convexities on current exam. No significant mass effect or midline shift. No acute intracranial appearing abnormality. 2. Postop  change from left lateral craniotomy with overlying scalp soft tissue emphysema and residual blood products. 3. Left periorbital and malar soft tissue swelling. Electronically Signed   By: Tollie Eth M.D.   On: 03/12/2018 20:52   Ct Head Wo Contrast  Result Date: 03/10/2018 CLINICAL DATA:  83 y/o  M; subdural hemorrhage for follow-up. EXAM: CT HEAD WITHOUT CONTRAST TECHNIQUE: Contiguous axial images were obtained from the base of the skull through the vertex without intravenous contrast. COMPARISON:  03/10/2018 CT head. FINDINGS: Brain: Interval left lateral craniotomy for evacuation of subdural hematoma. There is a small postoperative air and fluid-filled collection overlying the left cerebral convexity with minimal residual blood products. There is an interval decrease of mass effect with improved patency of the left lateral ventricle and near complete resolution of left to right midline shift. No new intracranial hemorrhage, stroke, mass lesion, hydrocephalus, or herniation is identified. Vascular: Calcific atherosclerosis of carotid siphons. No hyperdense vessel. Skull: Postsurgical changes related to a left lateral craniotomy with air and edema in the overlying scalp and a scalp drain noted. Sinuses/Orbits: Mild mucosal thickening of the maxillary sinuses and the left posterior ethmoid air cells. Normal aeration of the mastoid air cells. Orbits are unremarkable. Other: None. IMPRESSION: 1. Interval left lateral craniotomy with expected postsurgical changes. 2. Small postoperative air and fluid-filled collection overlying the left cerebral convexity with minimal residual blood products. 3. Decreased mass effect with improved patency of the left lateral ventricle and near complete resolution of left to right midline shift. 4. No new intracranial abnormality identified. Electronically Signed   By: Mitzi Hansen M.D.   On: 03/10/2018 22:00   Ct Head Wo Contrast  Result Date: 03/10/2018 CLINICAL  DATA:  Aphasic.  Recent fall EXAM: CT HEAD WITHOUT CONTRAST TECHNIQUE: Contiguous axial images were obtained from the base of the skull through the vertex without intravenous contrast. COMPARISON:  March 01, 2018 FINDINGS: Brain: There is an acute left-sided subdural hematoma with involvement primarily in the left temporal region but also to a lesser extent in the left frontal region. There is a maximum thickness of this subdural hematoma 1.5 cm. There is mass effect on the superior left temporal lobe and posterior most aspect of the left frontal lobe. There is 3 mm of midline shift to the right. There is mild underlying atrophy. No intra-axial mass or intra-axial hemorrhage noted. No other extra-axial fluid collections are identified. There is slight small vessel disease in the centra semiovale bilaterally. No evident acute infarct. Vascular: No hyperdense vessels. There is calcification in each carotid siphon region. Skull: There is a subtle linear fracture in the left temporal bone. Bony calvarium elsewhere appears intact. Sinuses/Orbits: There is mucosal thickening in several ethmoid air cells. Other visualized paranasal sinuses are clear. Visualized orbits appear  symmetric bilaterally. Other: Mastoid air cells are clear. IMPRESSION: Left-sided subdural hematoma, acute, with a maximum thickness of 1.5 cm. This subdural hematoma primarily involves the left temporal region but also involves the posterior left frontal region. There is mass effect on the left temporal lobe laterally with 3 mm of midline shift toward the right. No intra-axial hemorrhage or mass. Slight periventricular small vessel disease. Nondisplaced fracture left temporal bone. Mucosal thickening in several ethmoid air cells. Critical Value/emergent results were called by telephone at the time of interpretation on 03/10/2018 at 7:48 am to the covering ED department physician, who verbally acknowledged these results. Electronically Signed   By:  Bretta Bang III M.D.   On: 03/10/2018 07:49   Ct Head Wo Contrast  Result Date: 03/01/2018 CLINICAL DATA:  Fall.  Headache EXAM: CT HEAD WITHOUT CONTRAST CT CERVICAL SPINE WITHOUT CONTRAST TECHNIQUE: Multidetector CT imaging of the head and cervical spine was performed following the standard protocol without intravenous contrast. Multiplanar CT image reconstructions of the cervical spine were also generated. COMPARISON:  None. FINDINGS: CT HEAD FINDINGS Brain: Generalized atrophy without hydrocephalus. Negative for acute infarct, hemorrhage, or mass lesion. Vascular: Negative for hyperdense vessel Skull: Negative foot Sinuses/Orbits: Negative Other: None CT CERVICAL SPINE FINDINGS Alignment: Mild anterolisthesis C2-3. Mild retrolisthesis C3-4, C4-5, C5-6, and C6-7. Skull base and vertebrae: Negative for fracture Soft tissues and spinal canal: Negative Disc levels: Advanced disc degeneration and spurring C3 through C7. Multilevel spinal and foraminal stenosis due to spurring. Upper chest: Negative Other: None IMPRESSION: 1. No acute intracranial abnormality 2. Advanced cervical spondylosis.  Negative for fracture. Electronically Signed   By: Marlan Palau M.D.   On: 03/01/2018 16:33   Ct Cervical Spine Wo Contrast  Result Date: 03/01/2018 CLINICAL DATA:  Fall.  Headache EXAM: CT HEAD WITHOUT CONTRAST CT CERVICAL SPINE WITHOUT CONTRAST TECHNIQUE: Multidetector CT imaging of the head and cervical spine was performed following the standard protocol without intravenous contrast. Multiplanar CT image reconstructions of the cervical spine were also generated. COMPARISON:  None. FINDINGS: CT HEAD FINDINGS Brain: Generalized atrophy without hydrocephalus. Negative for acute infarct, hemorrhage, or mass lesion. Vascular: Negative for hyperdense vessel Skull: Negative foot Sinuses/Orbits: Negative Other: None CT CERVICAL SPINE FINDINGS Alignment: Mild anterolisthesis C2-3. Mild retrolisthesis C3-4, C4-5, C5-6, and  C6-7. Skull base and vertebrae: Negative for fracture Soft tissues and spinal canal: Negative Disc levels: Advanced disc degeneration and spurring C3 through C7. Multilevel spinal and foraminal stenosis due to spurring. Upper chest: Negative Other: None IMPRESSION: 1. No acute intracranial abnormality 2. Advanced cervical spondylosis.  Negative for fracture. Electronically Signed   By: Marlan Palau M.D.   On: 03/01/2018 16:33   Mr Maxine Glenn Head Wo Contrast  Result Date: 03/13/2018 CLINICAL DATA:  Altered mental status EXAM: MRI HEAD WITHOUT CONTRAST MRA HEAD WITHOUT CONTRAST TECHNIQUE: Multiplanar, multiecho pulse sequences of the brain and surrounding structures were obtained without intravenous contrast. Angiographic images of the head were obtained using MRA technique without contrast. COMPARISON:  Head CT 03/12/2018 FINDINGS: MRI HEAD FINDINGS BRAIN: There are extra-axial blood products over the left convexity. Multiple tiny foci of cortical diffusion weighted abnormality likely due to the presence of nearby blood. No acute infarct is identified. The left convexity collection measures up to 7 mm in thickness. The midline structures are normal. There are no old infarcts. The white matter signal is normal for the patient's age. Generalized atrophy without lobar predilection. Susceptibility-sensitive sequences show no chronic microhemorrhage or superficial siderosis. SKULL AND UPPER  CERVICAL SPINE: Status post left pterional craniotomy. SINUSES/ORBITS: No fluid levels or advanced mucosal thickening. No mastoid or middle ear effusion. The orbits are normal. MRA HEAD FINDINGS POSTERIOR CIRCULATION: --Basilar artery: Normal. --Posterior cerebral arteries: Normal. Both originate from the basilar artery. --Superior cerebellar arteries: Normal. --Inferior cerebellar arteries: Normal anterior and posterior inferior cerebellar arteries. ANTERIOR CIRCULATION: --Intracranial internal carotid arteries: Normal. --Anterior  cerebral arteries: Normal. Both A1 segments are present. Patent anterior communicating artery. --Middle cerebral arteries: Multifocal moderate-to-severe atherosclerotic narrowing of the M2 branches. Unremarkable right MCA. --Posterior communicating arteries: Absent bilaterally. IMPRESSION: 1. 7 mm extra-axial hematoma over the left convexity, as previously demonstrated on earlier head CT. No new site of hemorrhage. No acute ischemia. 2. 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 No intracranial large vessel occlusion. Electronically Signed   By: Deatra RobinsonKevin  Herman M.D.   On: 03/13/2018 17:48   Mr Laqueta JeanBrain W EAWo Contrast  Result Date: 03/13/2018 CLINICAL DATA:  Altered mental status EXAM: MRI HEAD WITHOUT CONTRAST MRA HEAD WITHOUT CONTRAST TECHNIQUE: Multiplanar, multiecho pulse sequences of the brain and surrounding structures were obtained without intravenous contrast. Angiographic images of the head were obtained using MRA technique without contrast. COMPARISON:  Head CT 03/12/2018 FINDINGS: MRI HEAD FINDINGS BRAIN: There are extra-axial blood products over the left convexity. Multiple tiny foci of cortical diffusion weighted abnormality likely due to the presence of nearby blood. No acute infarct is identified. The left convexity collection measures up to 7 mm in thickness. The midline structures are normal. There are no old infarcts. The white matter signal is normal for the patient's age. Generalized atrophy without lobar predilection. Susceptibility-sensitive sequences show no chronic microhemorrhage or superficial siderosis. SKULL AND UPPER CERVICAL SPINE: Status post left pterional craniotomy. SINUSES/ORBITS: No fluid levels or advanced mucosal thickening. No mastoid or middle ear effusion. The orbits are normal. MRA HEAD FINDINGS POSTERIOR CIRCULATION: --Basilar artery: Normal. --Posterior cerebral arteries: Normal. Both originate from the basilar artery. --Superior  cerebellar arteries: Normal. --Inferior cerebellar arteries: Normal anterior and posterior inferior cerebellar arteries. ANTERIOR CIRCULATION: --Intracranial internal carotid arteries: Normal. --Anterior cerebral arteries: Normal. Both A1 segments are present. Patent anterior communicating artery. --Middle cerebral arteries: Multifocal moderate-to-severe atherosclerotic narrowing of the M2 branches. Unremarkable right MCA. --Posterior communicating arteries: Absent bilaterally. IMPRESSION: 1. 7 mm extra-axial hematoma over the left convexity, as previously demonstrated on earlier head CT. No new site of hemorrhage. No acute ischemia. 2. 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 No intracranial large vessel occlusion. Electronically Signed   By: Deatra RobinsonKevin  Herman M.D.   On: 03/13/2018 17:48   Dg Chest Port 1 View  Result Date: 03/14/2018 CLINICAL DATA:  83 year old male with fever for 1 day. Altered mental status. EXAM: PORTABLE CHEST 1 VIEW COMPARISON:  No prior chest imaging. FINDINGS: Portable AP supine view at 2129 hours. Cardiomegaly. Tortuous thoracic aorta. Other mediastinal contours are within normal limits. Visualized tracheal air column is within normal limits. Allowing for portable technique the lungs are clear. No pneumothorax or pleural effusion is evident. No acute osseous abnormality identified. IMPRESSION: Cardiomegaly. No acute cardiopulmonary abnormality. Electronically Signed   By: Odessa FlemingH  Hall M.D.   On: 03/14/2018 00:55   EEG 2/17-2/18 This EEG is consistent with focal neuronal dysfunction in the left hemispheric region, with superimposed nonspecific diffuse cerebral dysfunction. No electrographic seizures were seen.  EEG 2/18-2/19 This EEG is consistent with focal neuronal dysfunction with intermittent electrographic seizures in the left hemispheric region.  There  is also superimposed nonspecific diffuse encephalopathy, mildly improved from previous  day's recording.   PHYSICAL EXAM  Temp:  [98.2 F (36.8 C)-99.3 F (37.4 C)] 98.8 F (37.1 C) (02/20 0800) Pulse Rate:  [65-101] 95 (02/20 1000) Resp:  [14-26] 20 (02/20 1000) BP: (101-148)/(66-85) 119/78 (02/20 1000) SpO2:  [66 %-98 %] 95 % (02/20 1000)  General - well nourished, well developed, not in acute distress.    Ophthalmologic - fundi not visualized due to noncooperation.    Cardiovascular - irregularly irregular heart rate and rhythm.  Neuro - awake alert however no language output, not following commands.  Neck supple, no meningismus sign.  PERRL, eye midline, attending to both side, no gaze deviation.  Not consistent with visual threat testing.  Right facial droop, tongue midline in mouth.  Left upper and lower extremities spontaneous movement and against gravity, right upper extremity flaccid, mild withdraw with pain stimulation, RLE lack of effort on exam but not able to against gravity.  DTR 1+, no Babinski. Sensation, coordination and gait not tested.   ASSESSMENT/PLAN Alvin Hudson is a 83 y.o. male with history of seizure on Keppra and MCI following with Dr. Rulon Eisenmenger at [facility] admitted for temporoparietal SDH. Underwent surgical evacuation by Dr. Lovell Sheehan and repeat CT post op showed decreased mass-effect and midline shift.  He was discharged but then readmitted due to acute onset altered mental status, global aphasia, and right arm weakness. Repeat CT showed no acute changes.    Left hemisphere seizure  5 clinical seizures since 03/14/18 s/p ativan, 3 simple partial seizure 03/15/18  continue LTM EEG  On keppra 1500mg  bid   On depakote 500mg  Q8h  On vimpat 200mg  bid   depakote level 62 (after am dose) - continue monitoring  Seizure precautions  PT/OT - pending  Left M2 high grade stenosis - ? Vasospasm vs. chronic   MRI no acute infarct or abscess  CT and MRI showed SDH improved after evacuation  MRA showed severe stenosis left M2 both  branches with decreased distal filling.   No previous MRA or CTA to compare  No good explanation for vasospasm - no SAH this time  BP and UOP improved  Continue IVF and TF and keep SBP 130-150 to improve perfusion  will consider CTA head and neck once LTM EEG taken off   afib RVR  New diagnosis  On cardizem drip - will transition to po today  Cardiology on board - appreciate help  Avoid low BP  No AC at this time due to recent SDH  SDH s/p evacuation  CT and MRI post op showed good evacuation  SDH near resolved   Mass effect improved on neuro imaging  Low grade fever, improving  Tmax 100.3->afebrile  WBC 13.8->12.9->10.2->9.3  UA neg  CXR unremarkable  Blood culture NGTD  Ammonia level normal  Dysphagia   Did not pass swallow  Speech on board  On TF via cortrak  Change IV meds to po  Other issue  Hyponatremia 3.3 - supplement  Hospital day # 4  This patient is critically ill due to aphasia and right arm plegia, seizure, left MCA stenosis and at significant risk of neurological worsening, death form stroke, status epilepticus, SDH expansion, brain herniation. This patient's care requires constant monitoring of vital signs, hemodynamics, respiratory and cardiac monitoring, review of multiple databases, neurological assessment, discussion with family, other specialists and medical decision making of high complexity. I spent 40 minutes of neurocritical care time in  the care of this patient. I had long discussion with wife and daughters at bedside, updated pt current condition, treatment plan and potential prognosis. They expressed understanding and appreciation.  Marvel Plan, MD PhD Stroke Neurology 03/16/2018 10:58 AM    To contact Stroke Continuity provider, please refer to [email or web address]. After hours, contact General Neurology

## 2018-03-17 DIAGNOSIS — I4891 Unspecified atrial fibrillation: Secondary | ICD-10-CM

## 2018-03-17 DIAGNOSIS — R569 Unspecified convulsions: Secondary | ICD-10-CM

## 2018-03-17 DIAGNOSIS — I4819 Other persistent atrial fibrillation: Secondary | ICD-10-CM

## 2018-03-17 LAB — CBC
HCT: 33.3 % — ABNORMAL LOW (ref 39.0–52.0)
Hemoglobin: 11.2 g/dL — ABNORMAL LOW (ref 13.0–17.0)
MCH: 34.9 pg — ABNORMAL HIGH (ref 26.0–34.0)
MCHC: 33.6 g/dL (ref 30.0–36.0)
MCV: 103.7 fL — AB (ref 80.0–100.0)
Platelets: 263 10*3/uL (ref 150–400)
RBC: 3.21 MIL/uL — ABNORMAL LOW (ref 4.22–5.81)
RDW: 13.2 % (ref 11.5–15.5)
WBC: 11.6 10*3/uL — ABNORMAL HIGH (ref 4.0–10.5)
nRBC: 0 % (ref 0.0–0.2)

## 2018-03-17 LAB — BASIC METABOLIC PANEL
ANION GAP: 7 (ref 5–15)
BUN: 9 mg/dL (ref 8–23)
CO2: 26 mmol/L (ref 22–32)
Calcium: 8.3 mg/dL — ABNORMAL LOW (ref 8.9–10.3)
Chloride: 102 mmol/L (ref 98–111)
Creatinine, Ser: 0.65 mg/dL (ref 0.61–1.24)
GFR calc Af Amer: >60 mL/min (ref >60)
GFR calc non Af Amer: >60 mL/min (ref >60)
GLUCOSE: 124 mg/dL — AB (ref 70–99)
Potassium: 4.4 mmol/L (ref 3.5–5.1)
Sodium: 135 mmol/L (ref 135–145)

## 2018-03-17 LAB — HEPATIC FUNCTION PANEL
ALT: 23 U/L (ref 0–44)
AST: 41 U/L (ref 15–41)
Albumin: 2.6 g/dL — ABNORMAL LOW (ref 3.5–5.0)
Alkaline Phosphatase: 45 U/L (ref 38–126)
BILIRUBIN DIRECT: 0.3 mg/dL — AB (ref 0.0–0.2)
Indirect Bilirubin: 0.8 mg/dL (ref 0.3–0.9)
Total Bilirubin: 1.1 mg/dL (ref 0.3–1.2)
Total Protein: 5.2 g/dL — ABNORMAL LOW (ref 6.5–8.1)

## 2018-03-17 LAB — GLUCOSE, CAPILLARY
Glucose-Capillary: 113 mg/dL — ABNORMAL HIGH (ref 70–99)
Glucose-Capillary: 127 mg/dL — ABNORMAL HIGH (ref 70–99)
Glucose-Capillary: 130 mg/dL — ABNORMAL HIGH (ref 70–99)
Glucose-Capillary: 138 mg/dL — ABNORMAL HIGH (ref 70–99)

## 2018-03-17 LAB — VALPROIC ACID LEVEL: Valproic Acid Lvl: 57 ug/mL (ref 50.0–100.0)

## 2018-03-17 NOTE — Progress Notes (Signed)
  Speech Language Pathology Treatment: Dysphagia;Cognitive-Linquistic  Patient Details Name: Alvin Hudson MRN: [id number] DOB: April 03, 1935 Today's Date: 03/17/2018 Time: 1443-1540 SLP Time Calculation (min) (ACUTE ONLY): 35 min  Assessment / Plan / Recommendation Clinical Impression  Easily aroused from sleep and sustained attention with SLP for approximately 15 minutes before falling asleep needing cues to remain awake. Majority of session spent educating wife to aphasia, pt's abilities, expectations and strategies/activities wife can implement with pt as appropriate. He followed 1/5 commands with motor apraxia evident. Hand over to initiate brushing teeth and cues to continue. Wiped his Hudson with verbal and visual cue. Saliva leakage from weaker Hudson/lips. No labial movements or phonation during automatic tasks (count, sing). Spontaneous neologisms with 2 questionable intelligible utterances "hey", "well". Pt demonstrated frustration/awareness of communication impairments x 1- explained to wife this demonstrates increased awareness and is a positive sign.  Ice chip presentations with reduced manipulation and prolonged transit with weak and delayed swallow with such small volume. Suspect he should be ready for instrumental swallow evaluation next week (will check on him closer to beginning of week Mon/Tues). Continue oral care.    HPI HPI: The patient is an 83 year old white male who fell, head CT negative in ED and did not seen to be admitted. On 2/14 pt with speech difficulty and decreased movement on right side. CT revealed large acute left subdural hematoma and pt underwent craniotomy discharged 2/14. CXR Cardiomegaly. No acute cardiopulmonary abnormality.      SLP Plan  Continue with current plan of care       Recommendations  Diet recommendations: NPO                General recommendations: Rehab consult Oral Care Recommendations: Oral care QID Follow up Recommendations: Inpatient  Rehab SLP Visit Diagnosis: Cognitive communication deficit (R41.841);Dysphagia, unspecified (R13.10) Plan: Continue with current plan of care       GO                Alvin Hudson 03/17/2018, 11:36 AM  Alvin Hudson Alvin Hudson.Ed Nurse, children's [phone] Office [phone]

## 2018-03-17 NOTE — Progress Notes (Signed)
LTM maintenance completed; checked skin under Fp1, Fp2, and Fz. No skin breakdown was seen.

## 2018-03-17 NOTE — Progress Notes (Signed)
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 Pt with significant functional decline, requiring mod A +2 with functional mobility with +2 HHA and Max A with ADL tasks due to below noted deficits. Feel pt is an excellent CIR candidate with excellent family support. Will follow acutely to maximize functional level of independence.    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 PMhx: Sz  Precautions  Precautions Fall  Precaution Comments coretrack  Restrictions  Weight Bearing Restrictions No  Home Living  Family/patient expects to be discharged to: Private residence  Living Arrangements Spouse/significant other  Available Help at Discharge Family;Available 24 hours/day  Type of Home House  Home Access Stairs to enter  Entrance Stairs-Number of Steps 3  Entrance Stairs-Rails None  Home Layout Two level;Able to live on main level with bedroom/bathroom  Alternate Level Stairs-Number of Steps flight  Barrister's clerk Accessibility Yes  How Accessible Accessible via walker  Home Equipment None  Prior Function  Level of Independence Independent  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, drives; is an Tree surgeon adn enjoys reading  Communication  Communication Expressive difficulties;HOH  Pain Assessment  Pain Assessment Faces  Faces Pain Scale 4  Pain Location headache  Pain Descriptors / Indicators Headache  Pain Intervention(s) Limited activity  within patient's tolerance  Cognition  Arousal/Alertness Awake/alert  Behavior During Therapy Flat affect;Restless  Overall Cognitive Status Impaired/Different from baseline  Area of Impairment Attention;Following commands;Safety/judgement;Awareness;Problem solving  Current Attention Level Sustained  Following Commands Follows one step commands inconsistently  Safety/Judgement Decreased awareness of safety;Decreased awareness of deficits  Awareness Intellectual  Problem Solving Slow processing;Decreased initiation;Difficulty sequencing;Requires verbal cues;Requires tactile cues  Upper Extremity Assessment  Upper Extremity Assessment RUE deficits/detail  RUE Deficits / Details generalized weakness; able to bring to mouth spontaneously; poor fine motor skills; able to grasp and maintain grasp on cloth cut difficulty using functionally.   RUE Sensation  (most likelt impaired)  RUE Coordination decreased fine motor;decreased gross motor  Lower Extremity Assessment  Lower Extremity Assessment Generalized weakness;Difficult to assess due to impaired cognition  Cervical / Trunk Assessment  Cervical / Trunk Assessment Other exceptions (R bias)  ADL  Overall ADL's  Needs assistance/impaired  Eating/Feeding NPO  Grooming Moderate assistance  Upper Body Bathing Moderate assistance;Sitting  Lower Body Bathing Maximal assistance;Sit to/from stand  Upper Body Dressing  Maximal assistance;Sitting  Lower Body Dressing Maximal assistance;Sit to/from stand  Lower Body Dressing Details (indicate cue type and reason) Able to donn R sock after set up; backward chaining used  Toilet Transfer Moderate assistance;+2 for physical assistance;Ambulation;Comfort height toilet  Toileting- Clothing Manipulation and Hygiene Maximal assistance;Sit to/from stand  Functional mobility during ADLs Moderate assistance;+2 for physical assistance  General ADL Comments Able to initiate tasks spontaneously; Difficulty  completing tasks on command  Vision- Assessment  Additional Comments Will further assess  Perception  Comments impaired spatial orientation; will further assess  Praxis  Praxis tested? Deficits  Deficits Initiation;Organization  Praxis-Other Comments Will further assess  Bed Mobility  Overal bed  mobility Needs Assistance  Bed Mobility Supine to Sit  Supine to sit HOB elevated;Mod assist  General bed mobility comments Pt able to initiate but requires A to complete transition to EOB  Transfers  Overall transfer level Needs assistance  Equipment used None  Transfers Sit to/from Stand  Sit to Stand Mod assist;+2 safety/equipment  General transfer comment 3 trials from bed and toilet, pt with posterior lean upon standing, pt required gestures to initiate standing and verbal cuing for hand placement for sitting, pt pushed up with hands on knees from toilet requiring trunk support  Balance  Overall balance assessment Needs assistance  Sitting-balance support Bilateral upper extremity supported;Feet supported  Sitting balance-Leahy Scale Poor  Standing balance-Leahy Scale Poor  OT - End of Session  Equipment Utilized During Treatment Gait belt  Activity Tolerance Patient tolerated treatment well  Patient left in chair;with call bell/phone within reach;with chair alarm set;with family/visitor present  Nurse Communication Mobility status  OT Assessment  OT Recommendation/Assessment Patient needs continued OT Services  OT Visit Diagnosis Other abnormalities of gait and mobility (R26.89);Muscle weakness (generalized) (M62.81);Apraxia (R48.2);Other symptoms and signs involving cognitive function;Pain  Pain - part of body  (unaure? reaching for head)  OT Problem List Decreased strength;Decreased range of motion;Decreased activity tolerance;Impaired balance (sitting and/or standing);Impaired vision/perception;Decreased coordination;Decreased cognition;Decreased safety awareness;Decreased  knowledge of use of DME or AE;Impaired sensation;Impaired UE functional use;Pain  OT Plan  OT Frequency (ACUTE ONLY) Min 3X/week  OT Treatment/Interventions (ACUTE ONLY) Self-care/ADL training;Therapeutic exercise;Neuromuscular education;DME and/or AE instruction;Therapeutic activities;Visual/perceptual remediation/compensation;Cognitive remediation/compensation;Patient/family education;Balance training  AM-PAC OT "6 Clicks" Daily Activity Outcome Measure (Version 2)  Help from another person eating meals? 2  Help from another person taking care of personal grooming? 2  Help from another person toileting, which includes using toliet, bedpan, or urinal? 2  Help from another person bathing (including washing, rinsing, drying)? 2  Help from another person to put on and taking off regular upper body clothing? 2  Help from another person to put on and taking off regular lower body clothing? 2  6 Click Score 12  OT Recommendation  Recommendations for Other Services Rehab consult  Follow Up Recommendations CIR;Supervision/Assistance - 24 hour  OT Equipment 3 in 1 bedside commode  Individuals Consulted  Consulted and Agree with Results and Recommendations Patient;Family member/caregiver  Family Member Consulted wife  Acute Rehab OT Goals  Patient Stated Goal per wife for her husband to get better  OT Goal Formulation With family  Time For Goal Achievement 03/31/18  Potential to Achieve Goals Good  OT Time Calculation  OT Start Time (ACUTE ONLY) 1239  OT Stop Time (ACUTE ONLY) 1322  OT Time Calculation (min) 43 min  OT General Charges  $OT Visit 1 Visit  OT Evaluation  $OT Eval Moderate Complexity 1 Mod  Written Expression  Dominant Hand Right  Luisa Dago, OT/L   Acute OT Clinical Specialist Acute Rehabilitation Services Pager [phone] Office [phone]

## 2018-03-17 NOTE — Progress Notes (Signed)
Neurosurgery Service Progress Note  Subjective: 3 simple partial Sz reported on EEG overnight, none since  Objective: Vitals:   03/17/18 0600 03/17/18 0626 03/17/18 0700 03/17/18 0800  BP: (!) 141/95 (!) 138/97 128/72 119/80  Pulse: (!) 105  78 94  Resp: (!) 26  (!) 21 (!) 22  Temp:   99 F (37.2 C)   TempSrc:   Axillary   SpO2: 95%  95% 92%   Temp (24hrs), Avg:98.9 F (37.2 C), Min:98.1 F (36.7 C), Max:99.7 F (37.6 C)  CBC Latest Ref Rng & Units 03/17/2018 03/16/2018 03/15/2018  WBC 4.0 - 10.5 K/uL 11.6(H) 9.3 10.2  Hemoglobin 13.0 - 17.0 g/dL 11.2(L) 11.1(L) 9.5(L)  Hematocrit 39.0 - 52.0 % 33.3(L) 33.3(L) 29.0(L)  Platelets 150 - 400 K/uL 263 254 196   BMP Latest Ref Rng & Units 03/17/2018 03/16/2018 03/15/2018  Glucose 70 - 99 mg/dL 224(M) 250(I) 370(W)  BUN 8 - 23 mg/dL 9 7(L) 5(L)  Creatinine 0.61 - 1.24 mg/dL 8.88 9.16 9.45(W)  Sodium 135 - 145 mmol/L 135 136 136  Potassium 3.5 - 5.1 mmol/L 4.4 3.3(L) 5.4(H)  Chloride 98 - 111 mmol/L 102 101 106  CO2 22 - 32 mmol/L 26 26 23   Calcium 8.9 - 10.3 mg/dL 8.3(L) 8.3(L) 7.7(L)    Intake/Output Summary (Last 24 hours) at 03/17/2018 0817 Last data filed at 03/17/2018 0800 Gross per 24 hour  Intake 2924.87 ml  Output 2125 ml  Net 799.87 ml    Current Facility-Administered Medications:  .  0.9 %  sodium chloride infusion, , Intravenous, Continuous, Marvel Plan, MD, Last Rate: 50 mL/hr at 03/17/18 0800 .  acetaminophen (TYLENOL) tablet 650 mg, 650 mg, Oral, Q4H PRN **OR** acetaminophen (TYLENOL) suppository 650 mg, 650 mg, Rectal, Q4H PRN, Jadene Pierini, MD, 650 mg at 03/14/18 1232 .  atorvastatin (LIPITOR) tablet 20 mg, 20 mg, Oral, Daily, Jadene Pierini, MD, 20 mg at 03/16/18 0939 .  B-complex with vitamin C tablet 1 tablet, 1 tablet, Oral, Daily, Tressie Stalker, MD, 1 tablet at 03/16/18 [phone] .  chlorhexidine (PERIDEX) 0.12 % solution 15 mL, 15 mL, Mouth Rinse, BID, Meyran, Tiana Loft, NP, 15 mL at 03/16/18  2158 .  diltiazem (CARDIZEM) tablet 60 mg, 60 mg, Per NG tube, Q6H, Lyn Records, MD, 60 mg at 03/17/18 2800 .  divalproex (DEPAKOTE) DR tablet 500 mg, 500 mg, Oral, Q8H, Marvel Plan, MD, 500 mg at 03/17/18 3491 .  docusate sodium (COLACE) capsule 100 mg, 100 mg, Oral, BID, Darrek Leasure A, MD .  feeding supplement (OSMOLITE 1.2 CAL) liquid 1,000 mL, 1,000 mL, Per Tube, Continuous, Marvel Plan, MD, Last Rate: 60 mL/hr at 03/17/18 0800 .  feeding supplement (PRO-STAT SUGAR FREE 64) liquid 30 mL, 30 mL, Per Tube, Daily, Marvel Plan, MD, 30 mL at 03/16/18 0938 .  haloperidol lactate (HALDOL) injection 2 mg, 2 mg, Intravenous, Q6H PRN, Marvel Plan, MD, 2 mg at 03/13/18 1517 .  HYDROcodone-acetaminophen (NORCO/VICODIN) 5-325 MG per tablet 1 tablet, 1 tablet, Oral, Q4H PRN, Fynn Adel A, MD .  labetalol (NORMODYNE,TRANDATE) injection 10-40 mg, 10-40 mg, Intravenous, Q10 min PRN, Jadene Pierini, MD .  lacosamide (VIMPAT) tablet 200 mg, 200 mg, Oral, BID, Marvel Plan, MD, 200 mg at 03/16/18 2159 .  levETIRAcetam (KEPPRA) 100 MG/ML solution 1,500 mg, 1,500 mg, Oral, BID, Tressie Stalker, MD, 1,500 mg at 03/16/18 2158 .  MEDLINE mouth rinse, 15 mL, Mouth Rinse, q12n4p, Meyran, Tiana Loft, NP, 15 mL at 03/16/18 1719 .  ondansetron (ZOFRAN) tablet 4 mg, 4 mg, Oral, Q4H PRN **OR** ondansetron (ZOFRAN) injection 4 mg, 4 mg, Intravenous, Q4H PRN, Crissy Mccreadie, Clovis Pu, MD .  polycarbophil (FIBERCON) tablet 625 mg, 625 mg, Oral, Daily, Jadene Pierini, MD, 625 mg at 03/16/18 5300 .  sertraline (ZOLOFT) tablet 100 mg, 100 mg, Oral, Daily, Jadene Pierini, MD, 100 mg at 03/16/18 0939 .  temazepam (RESTORIL) capsule 15 mg, 15 mg, Oral, QHS, Sheron Robin, Clovis Pu, MD, 15 mg at 03/16/18 2159   Physical Exam: Awake/alert, left sided preference, legs crossed, receptive aphasia but has started making unintelligible responses to questions, L sided preference but moving RUE  Assessment &  Plan: 83 y.o. man s/p evacuation of L SDH with good recovery post-op then development of aphasia and R sided weakness. 2/19 early AM clinical seizure, loaded w/ VPA, 2/20 multiple electrographic Sz over 24h, added vimpat -defer Sz management to neurology -exam improved today, EEG improving  Jadene Pierini  03/17/18 8:17 AM

## 2018-03-17 NOTE — Progress Notes (Addendum)
Subjective: Clinically making some improvement  Exam: Vitals:   03/17/18 0700 03/17/18 0800  BP: 128/72 119/80  Pulse: 78 94  Resp: (!) 21 (!) 22  Temp: 99 F (37.2 C)   SpO2: 95% 92%   Gen: In bed, NAD Resp: non-labored breathing, no acute distress Abd: soft, nt  Neuro: MS: awake, follows command to stick out tongue, no other commands, speech is incomprehensible sounds [id number] midline in both directions with EOM, fixates and tracks Motor: moves all extremities, though less on right than left.  Sensory:responds to nox stim x 4.,   VPA 57  Impression: 83 yo M with SDH and recurrent seizures. His exam is improving and he has improvement in his EEG as well, though report from today is pending.   Recommendations: 1) depakote 500mg  TID, daily levels 2) lacosamide 200mg  q12H 3) levetiracetam 1500mg  BID 4) Can d/c EEG once seizure free > 24 hours.   Ritta Slot, MD [facility] [phone]  If 7pm- 7am, please page neurology on call as listed in AMION. 03/17/2018  8:38 AM

## 2018-03-17 NOTE — Procedures (Signed)
Electroencephalogram report- LTM  Ordering Physician : Dr. Roda Shutters    Beginning date or time: 03/16/2008 11:00 hours Ending date or time:03/17/2018 10:00 hours  Day of study: 4  Medications include: Per EMR  MENTAL STATUS (per technician's notes): Obtunded  HISTORY: This 24 hours of intensive EEG monitoring with simultaneous video monitoring was performed for this patient with Subdural hemorrhage and altered mental status. This EEG was requested to rule out subclinical electrographic seizures.  TECHNICAL DESCRIPTION:  The study consists of a continuous 16-channel multi-montage digital video EEG recording with twenty-one electrodes placed according to the International 10-20 System. Additional leads included eye leads, true temporal leads (T1, T2), and an EKG lead. Activation procedures were not done due to mental status.  REPORT: The background activity in this tracing consisted of polymorphic delta and theta background, reaching about 7Hz  on the right. The activity seemed to be reactive to tactile stimuli.  There was overriding beta activity seen in the right hemispheric region.  Low voltage polymorphic delta activity was prominent in the left hemispheric region.  Occasionally, low voltage sharp waves were seen in the left parieto-occipital region.  No electrographic seizures were seen.  IMPRESSION: This is an abnormal EEG due to: Focal slowing in the left hemispheric region Diffuse slowing - improved from previous days recording  CLINICAL CORRELATION: This EEG is consistent with focal neuronal dysfunction in the left hemispheric region.  There is also superimposed nonspecific mild diffuse encephalopathy, mildly improved from previous day's recording. No electrographic seizures were seen.

## 2018-03-17 NOTE — Evaluation (Signed)
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 PMhx: Sz  Clinical Impression  Pt is an 83yo male admitted for above. Pt presents with decreased cognition, strength, and activity tolerance limiting his functional mobility. Pt presents with aphasia and apraxia. Pt is able to make needs known about when he needs to use the restroom. Pt will benefit from acute skilled therapy to improve independence and decrease caregiver burden. Pt would benefit from continued rehab after discharge to continue to improve independence and functional mobility. Pts wife present during evaluation and was informed of plan and consented.     Follow Up Recommendations CIR;Supervision/Assistance - 24 hour    Equipment Recommendations  Other (comment)(TBD)    Recommendations for Other Services OT consult;Rehab consult     Precautions / Restrictions Precautions Precautions: Fall Restrictions Weight Bearing Restrictions: No      Mobility  Bed Mobility Overal bed mobility: Needs Assistance Bed Mobility: Supine to Sit     Supine to sit: Mod assist;+2 for safety/equipment;HOB elevated     General bed mobility comments: assist for safety, pt required multimodal cuing for initiating cuing for initiation and getting legs to side of bed but pt able to assist and push into mattress to help  Transfers Overall transfer level: Needs assistance Equipment used: None Transfers: Sit to/from Stand Sit to Stand: Mod assist;+2 safety/equipment         General transfer comment: 3 trials from bed and toilet, pt with posterior lean upon standing, pt required gestures to initiate standing and verbal cuing for hand placement for sitting, pt pushed up  with hands on knees from toilet requiring trunk support  Ambulation/Gait Ambulation/Gait assistance: Mod assist;+2 safety/equipment Gait Distance (Feet): 20 Feet Assistive device: None Gait Pattern/deviations: Decreased stride length;Narrow base of support;Leaning posteriorly Gait velocity: decreased   General Gait Details: pt ambulated to/from restroom requiring 2 person assist, pt ambulates with apraxic gait of short, hesitant steps, pt responded well the verbal cuing of lets walk for initiation,   Stairs            Wheelchair Mobility    Modified Rankin (Stroke Patients Only)       Balance Overall balance assessment: Needs assistance Sitting-balance support: Bilateral upper extremity supported;Feet supported Sitting balance-Leahy Scale: Poor Sitting balance - Comments: pt able to maintain sitting balance on toilet with no assist    Standing balance support: Bilateral upper extremity supported Standing balance-Leahy Scale: Poor Standing balance comment: pt with posterior, right lateral lean upon initial standing, bilateral UE supported on therapists                             Pertinent Vitals/Pain Pain Assessment: No/denies pain Faces Pain Scale: Hurts a little bit Pain Intervention(s): Monitored during session    Home Living Family/patient expects to be discharged to:: Private residence Living Arrangements: Spouse/significant other Available Help at Discharge: Family;Available 24 hours/day Type of Home: House Home Access: Stairs to enter Entrance Stairs-Rails: None Entrance Stairs-Number of Steps: 3 Home Layout: Two level;Able to live on main level with bedroom/bathroom Home Equipment: None      Prior Function Level of Independence: Independent         Comments:  no AD, drives     Hand Dominance   Dominant Hand: Right    Extremity/Trunk Assessment   Upper Extremity Assessment Upper Extremity Assessment: Defer to OT evaluation     Lower Extremity Assessment Lower Extremity Assessment: Generalized weakness;Difficult to assess due to impaired cognition    Cervical / Trunk Assessment Cervical / Trunk Assessment: Normal  Communication   Communication: Expressive difficulties;HOH  Cognition Arousal/Alertness: Awake/alert Behavior During Therapy: Flat affect Overall Cognitive Status: Impaired/Different from baseline Area of Impairment: Following commands;Safety/judgement;Awareness;Problem solving;Attention               Rancho Levels of Cognitive Functioning Rancho [place] Amigos Scales of Cognitive Functioning: Confused/appropriate   Current Attention Level: Focused   Following Commands: Follows one step commands inconsistently;Follows one step commands with increased time Safety/Judgement: Decreased awareness of safety;Decreased awareness of deficits   Problem Solving: Slow processing;Decreased initiation;Difficulty sequencing;Requires verbal cues;Requires tactile cues        General Comments General comments (skin integrity, edema, etc.): pt figidity with lines    Exercises     Assessment/Plan    PT Assessment Patient needs continued PT services  PT Problem List Decreased strength;Decreased coordination;Decreased cognition;Decreased range of motion;Decreased activity tolerance;Decreased knowledge of use of DME;Decreased balance;Decreased safety awareness;Decreased mobility       PT Treatment Interventions DME instruction;Therapeutic exercise;Gait training;Balance training;Stair training;Neuromuscular re-education;Therapeutic activities;Patient/family education;Functional mobility training;Cognitive remediation    PT Goals (Current goals can be found in the Care Plan section)  Acute Rehab PT Goals Patient Stated Goal: not stated PT Goal Formulation: Patient unable to participate in goal setting    Frequency Min 4X/week   Barriers to discharge        Co-evaluation PT/OT/SLP  Co-Evaluation/Treatment: Yes Reason for Co-Treatment: Complexity of the patient's impairments (multi-system involvement);Necessary to address cognition/behavior during functional activity;To address functional/ADL transfers PT goals addressed during session: Mobility/safety with mobility;Strengthening/ROM;Balance         AM-PAC PT "6 Clicks" Mobility  Outcome Measure Help needed turning from your back to your side while in a flat bed without using bedrails?: A Lot Help needed moving from lying on your back to sitting on the side of a flat bed without using bedrails?: A Lot Help needed moving to and from a bed to a chair (including a wheelchair)?: A Lot Help needed standing up from a chair using your arms (e.g., wheelchair or bedside chair)?: A Lot Help needed to walk in hospital room?: A Lot Help needed climbing 3-5 steps with a railing? : A Lot 6 Click Score: 12    End of Session Equipment Utilized During Treatment: Gait belt Activity Tolerance: Patient tolerated treatment well Patient left: in chair;with call bell/phone within reach;with family/visitor present;with chair alarm set Nurse Communication: Mobility status PT Visit Diagnosis: Unsteadiness on feet (R26.81);History of falling (Z91.81);Other symptoms and signs involving the nervous system (R29.898);Apraxia (R48.2);Difficulty in walking, not elsewhere classified (R26.2)    Time: 6440-3474 PT Time Calculation (min) (ACUTE ONLY): 43 min   Charges:   PT Evaluation $PT Eval Moderate Complexity: 1 Mod PT Treatments $Therapeutic Activity: 8-22 mins        Alvin Hudson, [place] [phone]   Alvin Hudson 03/17/2018, 2:13 PM

## 2018-03-17 NOTE — Progress Notes (Addendum)
The patient has been seen in conjunction with Vin Bhagat, PAC. All aspects of care have been considered and discussed. The patient has been personally interviewed, examined, and all clinical data has been reviewed.   Awake.  Unable to follow commands.  Atrial fibrillation with controlled rate now on every 6 hour immediate release diltiazem.  This medication can be consolidated to a once a day therapy once he is able to swallow.   Progress Note  Patient Name: Alvin Hudson Date of Encounter: 03/17/2018  Primary Cardiologist: New to Dr. Katrinka Blazing  Subjective   Unable to communicate. Family at bedside. HR improving.   Inpatient Medications    Scheduled Meds: . atorvastatin  20 mg Oral Daily  . B-complex with vitamin C  1 tablet Oral Daily  . chlorhexidine  15 mL Mouth Rinse BID  . diltiazem  60 mg Per NG tube Q6H  . divalproex  500 mg Oral Q8H  . docusate sodium  100 mg Oral BID  . feeding supplement (PRO-STAT SUGAR FREE 64)  30 mL Per Tube Daily  . lacosamide  200 mg Oral BID  . levETIRAcetam  1,500 mg Oral BID  . mouth rinse  15 mL Mouth Rinse q12n4p  . polycarbophil  625 mg Oral Daily  . sertraline  100 mg Oral Daily  . temazepam  15 mg Oral QHS   Continuous Infusions: . sodium chloride 50 mL/hr at 03/17/18 0800  . feeding supplement (OSMOLITE 1.2 CAL) 60 mL/hr at 03/17/18 0800   PRN Meds: acetaminophen **OR** acetaminophen, haloperidol lactate, HYDROcodone-acetaminophen, labetalol, ondansetron **OR** ondansetron (ZOFRAN) IV   Vital Signs    Vitals:   03/17/18 0600 03/17/18 0626 03/17/18 0700 03/17/18 0800  BP: (!) 141/95 (!) 138/97 128/72 119/80  Pulse: (!) 105  78 94  Resp: (!) 26  (!) 21 (!) 22  Temp:   99 F (37.2 C)   TempSrc:   Axillary   SpO2: 95%  95% 92%    Intake/Output Summary (Last 24 hours) at 03/17/2018 3329 Last data filed at 03/17/2018 0800 Gross per 24 hour  Intake 2924.87 ml  Output 2125 ml  Net 799.87 ml   Last 3 Weights 03/10/2018 03/01/2018    Weight (lbs) 157 lb 155 lb  Weight (kg) 71.215 kg 70.308 kg      Telemetry    Atrial fibrillation/flutter at 70-80s - Personally Reviewed  ECG    N/A  Physical Exam   GEN: Ill appearing male in no acute distress.   Neck: No JVD, nasogastric tube inplace Cardiac: Ir IR, no murmurs, rubs, or gallops.  Respiratory: Clear to auscultation bilaterally. GI: Soft, nontender, non-distended  MS: No edema Neuro: unable to communicate,  Psych: confused  Labs    Chemistry Recent Labs  Lab 03/12/18 2001  03/15/18 0453 03/16/18 0656 03/17/18 0255  NA 131*   < > 136 136 135  K 3.7   < > 5.4* 3.3* 4.4  CL 97*   < > 106 101 102  CO2 26   < > 23 26 26   GLUCOSE 138*   < > 113* 140* 124*  BUN 8   < > 5* 7* 9  CREATININE 0.75   < > 0.60* 0.71 0.65  CALCIUM 9.0   < > 7.7* 8.3* 8.3*  PROT 6.3*  --   --   --  5.2*  ALBUMIN 3.5  --   --   --  2.6*  AST 40  --   --   --  41  ALT 32  --   --   --  23  ALKPHOS 49  --   --   --  45  BILITOT 1.8*  --   --   --  1.1  GFRNONAA >60   < > >60 >60 >60  GFRAA >60   < > >60 >60 >60  ANIONGAP 8   < > 7 9 7    < > = values in this interval not displayed.     Hematology Recent Labs  Lab 03/15/18 0453 03/16/18 0656 03/17/18 0255  WBC 10.2 9.3 11.6*  RBC 2.73* 3.18* 3.21*  HGB 9.5* 11.1* 11.2*  HCT 29.0* 33.3* 33.3*  MCV 106.2* 104.7* 103.7*  MCH 34.8* 34.9* 34.9*  MCHC 32.8 33.3 33.6  RDW 13.2 13.0 13.2  PLT 196 254 263    Cardiac Enzymes Recent Labs  Lab 03/14/18 0435 03/14/18 1720  TROPONINI 0.15* 0.10*   No results for input(s): TROPIPOC in the last 168 hours.   BNP Recent Labs  Lab 03/14/18 1720  BNP 702.9*   Radiology    No results found.  Cardiac Studies   Echo 03/15/2018 IMPRESSIONS   1. The left ventricle has normal systolic function with an ejection fraction of 60-65%. The cavity size was normal. There is severely increased left ventricular wall thickness. Left ventricular diastolic Doppler parameters are  consistent with  restrictive filling.  2. Findings consistent wtih HOCM. SAM present LVOT gradient not well characterized but at rest likely more than 2254m/sec.  3. The right ventricle has normal systolic function. The cavity was normal. There is no increase in right ventricular wall thickness.  4. Left atrial size was moderately dilated.  5. The mitral valve is degenerative. Mild thickening of the mitral valve leaflet. Mild calcification of the mitral valve leaflet.  6. The tricuspid valve is normal in structure.  7. The aortic valve is tricuspid Mild thickening of the aortic valve Moderate calcification of the aortic valve. Aortic valve regurgitation is mild by color flow Doppler.  8. The pulmonic valve was normal in structure. Pulmonic valve regurgitation is mild by color flow Doppler.  Patient Profile     83 y.o. male with a history of seizures on Keppraand known LBBB but no known cardiac historywho is being seen for the evaluation of atrial fibrillation andelevated troponin.  Aadmitted fortemporoparietal SDH. Underwent surgical evacuation by Dr. Penny PiaJenkinsand repeat CT post op showed decreased mass-effect and midline shift. He was dischargedbut then readmitted due to acute onset altered mental status, global aphasia, andright arm weakness.  Assessment & Plan    1. New onset atrial fibrillation - diltiazem gtt changed to po Cardizem 60mg  q 6 hours yesterday. HR stable in 80s. Up titrate as need up to 90mg  q6 hours for additional HR support (IF sustaining above 120 bpm). Consolidate to long active when able to take po or at discharge.  - CHADS VASC > 2. Not a anticoagulation candidate due to intracranial injury and bleeding risk.   2. Elevated Troponin - Demand in setting of acute illness. No further work up.   3. 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 Normal LVEF. On Cardizem.   [facility] will sign off.   Medication Recommendations:  As summarized  above Other recommendations (labs, testing, etc):  none Follow up as an outpatient:  Will schedule   For questions or updates, please contact [facility] Please consult [email or web address] for contact info under   SignedManson Passey, Bhavinkumar Bhagat, PA  03/17/2018,  8:28 AM

## 2018-03-17 NOTE — Progress Notes (Signed)
PT Cancellation Note  Patient Details Name: Alvin Hudson MRN: [id number] DOB: October 23, 1935   Cancelled Treatment:    Reason Eval/Treat Not Completed: Other (comment)(pt on continuous EEG and not currently appropriate)   Berdina Cheever B Myria Steenbergen 03/17/2018, 9:37 AM  Delaney Meigs, PT Acute Rehabilitation Services Pager: [phone] Office: [phone]

## 2018-03-17 NOTE — Progress Notes (Signed)
OT Cancellation  Pt on continuous EEG.   03/17/18 1000  OT Visit Information  Last OT Received On 03/17/18  Reason Eval/Treat Not Completed Patient not medically ready  Luisa Dago, OT/L   Acute OT Clinical Specialist Acute Rehabilitation Services Pager [phone] Office [phone]

## 2018-03-17 NOTE — Progress Notes (Signed)
Rehab Admissions Coordinator Note:  Patient was screened by Clois Dupes for appropriateness for an Inpatient Acute Rehab Consult per therapy recommendations.  At this time, we are recommending Inpatient Rehab consult. Please place order for consult.  Clois Dupes RN MSN 03/17/2018, 5:23 PM  I can be reached at [phone].

## 2018-03-18 ENCOUNTER — Encounter (HOSPITAL_COMMUNITY): Payer: Self-pay | Admitting: Radiology

## 2018-03-18 ENCOUNTER — Inpatient Hospital Stay (HOSPITAL_COMMUNITY): Payer: Medicare Other

## 2018-03-18 LAB — BASIC METABOLIC PANEL
Anion gap: 12 (ref 5–15)
BUN: 9 mg/dL (ref 8–23)
CO2: 23 mmol/L (ref 22–32)
Calcium: 8.7 mg/dL — ABNORMAL LOW (ref 8.9–10.3)
Chloride: 99 mmol/L (ref 98–111)
Creatinine, Ser: 0.58 mg/dL — ABNORMAL LOW (ref 0.61–1.24)
GFR calc Af Amer: >60 mL/min (ref >60)
GFR calc non Af Amer: >60 mL/min (ref >60)
Glucose, Bld: 123 mg/dL — ABNORMAL HIGH (ref 70–99)
Potassium: 3.8 mmol/L (ref 3.5–5.1)
Sodium: 134 mmol/L — ABNORMAL LOW (ref 135–145)

## 2018-03-18 LAB — CBC
HCT: 34.8 % — ABNORMAL LOW (ref 39.0–52.0)
Hemoglobin: 11.3 g/dL — ABNORMAL LOW (ref 13.0–17.0)
MCH: 34.7 pg — ABNORMAL HIGH (ref 26.0–34.0)
MCHC: 32.5 g/dL (ref 30.0–36.0)
MCV: 106.7 fL — ABNORMAL HIGH (ref 80.0–100.0)
Platelets: 303 10*3/uL (ref 150–400)
RBC: 3.26 MIL/uL — ABNORMAL LOW (ref 4.22–5.81)
RDW: 13.2 % (ref 11.5–15.5)
WBC: 10.8 10*3/uL — ABNORMAL HIGH (ref 4.0–10.5)
nRBC: 0 % (ref 0.0–0.2)

## 2018-03-18 LAB — CULTURE, BLOOD (ROUTINE X 2)
Culture: NO GROWTH
Culture: NO GROWTH
Special Requests: ADEQUATE

## 2018-03-18 LAB — GLUCOSE, CAPILLARY
GLUCOSE-CAPILLARY: 119 mg/dL — AB (ref 70–99)
GLUCOSE-CAPILLARY: 110 mg/dL — AB (ref 70–99)
GLUCOSE-CAPILLARY: 108 mg/dL — AB (ref 70–99)
Glucose-Capillary: 118 mg/dL — ABNORMAL HIGH (ref 70–99)

## 2018-03-18 LAB — VALPROIC ACID LEVEL: Valproic Acid Lvl: 65 ug/mL (ref 50.0–100.0)

## 2018-03-18 MED ORDER — IOPAMIDOL (ISOVUE-370) INJECTION 76%
75.0000 mL | Freq: Once | INTRAVENOUS | Status: AC | PRN
  Administered 2018-03-18: 100 mL via INTRAVENOUS

## 2018-03-18 MED ORDER — VALPROIC ACID 250 MG/5ML PO SOLN
500.0000 mg | Freq: Three times a day (TID) | ORAL | Status: DC
  Administered 2018-03-18 – 2018-03-19 (×3): 500 mg via ORAL
  Filled 2018-03-18 (×5): qty 10

## 2018-03-18 NOTE — Progress Notes (Signed)
  NEUROSURGERY PROGRESS NOTE   No issues overnight. Wife at bedside  EXAM:  BP 116/83   Pulse 92   Temp 98.2 F (36.8 C) (Oral)   Resp 19   Wt 71 kg   SpO2 93%   BMI 21.83 kg/m   Awake, alert,  Makes unintelligible noises CN grossly intact  Moves all extremities well, mild right hemiparesis  IMPRESSION:  83 y.o. male POD#8 s/p left FP crani for SDH, postop SZ. Has been SZ free >24 hrs. Aphasia largely unchanged  PLAN: - Cont AEDs per neurology - Cont PT/OT/SLP, will need placement

## 2018-03-18 NOTE — Progress Notes (Signed)
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 PMhx: Sz    PT Comments    Pt progressing well with mobility. He ambulated 45 feet mod assist without AD. +2 utilized for safety/equipment. Pt positioned in recliner at end of session. Wife present in room and encouraged by his progress.   Follow Up Recommendations  CIR;Supervision/Assistance - 24 hour     Equipment Recommendations  Other (comment)(TBD)    Recommendations for Other Services       Precautions / Restrictions Precautions Precautions: Fall;Other (comment) Precaution Comments: coretrack    Mobility  Bed Mobility Overal bed mobility: Needs Assistance Bed Mobility: Supine to Sit     Supine to sit: HOB elevated;Mod assist     General bed mobility comments: +rail, cues for sequencing  Transfers Overall transfer level: Needs assistance Equipment used: None Transfers: Sit to/from Stand Sit to Stand: +2 safety/equipment;Mod assist         General transfer comment: cues for sequencing, increased time  Ambulation/Gait Ambulation/Gait assistance: Mod assist;+2 safety/equipment Gait Distance (Feet): 45 Feet Assistive device: None Gait Pattern/deviations: Step-through pattern;Decreased stride length Gait velocity: decreased   General Gait Details: unsteady gait requiring assist to maintain balance. Pt fatigues quickly    Stairs             Wheelchair Mobility    Modified Rankin (Stroke Patients Only)       Balance Overall balance assessment: Needs assistance Sitting-balance support: No upper extremity supported;Feet supported Sitting balance-Leahy Scale: Fair Sitting balance - Comments: min guard to sit EOB   Standing  balance support: No upper extremity supported;During functional activity Standing balance-Leahy Scale: Poor Standing balance comment: reliant on external support of therapist                            Cognition Arousal/Alertness: Awake/alert Behavior During Therapy: Flat affect Overall Cognitive Status: Impaired/Different from baseline Area of Impairment: Attention;Following commands;Safety/judgement;Awareness;Problem solving               Rancho Levels of Cognitive Functioning Rancho Los Amigos Scales of Cognitive Functioning: Confused/appropriate   Current Attention Level: Sustained   Following Commands: Follows one step commands inconsistently;Follows one step commands with increased time Safety/Judgement: Decreased awareness of safety;Decreased awareness of deficits Awareness: Intellectual Problem Solving: Slow processing;Decreased initiation;Difficulty sequencing;Requires verbal cues;Requires tactile cues        Exercises      General Comments General comments (skin integrity, edema, etc.): wife present at end of session      Pertinent Vitals/Pain Pain Assessment: Faces Faces Pain Scale: No hurt    Home Living                      Prior Function            PT Goals (current goals can now be found in the care plan section) Acute Rehab PT Goals Patient Stated Goal: per wife for her husband to get better PT Goal Formulation: Patient unable to participate in goal setting Time For Goal Achievement: 03/31/18 Potential to Achieve Goals: Good Progress towards PT goals: Progressing toward goals    Frequency    Min 4X/week  PT Plan Current plan remains appropriate    Co-evaluation              AM-PAC PT "6 Clicks" Mobility   Outcome Measure  Help needed turning from your back to your side while in a flat bed without using bedrails?: A Little Help needed moving from lying on your back to sitting on the side of a flat bed  without using bedrails?: A Lot Help needed moving to and from a bed to a chair (including a wheelchair)?: A Lot Help needed standing up from a chair using your arms (e.g., wheelchair or bedside chair)?: A Lot Help needed to walk in hospital room?: A Lot Help needed climbing 3-5 steps with a railing? : A Lot 6 Click Score: 13    End of Session Equipment Utilized During Treatment: Gait belt Activity Tolerance: Patient tolerated treatment well Patient left: in chair;with call bell/phone within reach;with family/visitor present;with chair alarm set Nurse Communication: Mobility status PT Visit Diagnosis: Unsteadiness on feet (R26.81);History of falling (Z91.81);Other symptoms and signs involving the nervous system (R29.898);Apraxia (R48.2);Difficulty in walking, not elsewhere classified (R26.2)     Time: 2458-0998 PT Time Calculation (min) (ACUTE ONLY): 25 min  Charges:  $Gait Training: 23-37 mins                     Aida Raider, [place]  Office # [phone] Pager [phone]    Ilda Foil 03/18/2018, 12:41 PM

## 2018-03-18 NOTE — Progress Notes (Signed)
Subjective: Continues with some improvement.   Exam: Vitals:   03/18/18 0600 03/18/18 0741  BP: 116/83   Pulse: 92   Resp: 19   Temp:  98.2 F (36.8 C)  SpO2: 93%    Gen: In bed, NAD Resp: non-labored breathing, no acute distress Abd: soft, nt  Neuro: MS: awake, follows command to stick out tongue, close eyes, but no appendicular commands.  He is more interactive than he was yesterday. NL:ZJQBHAL midline in both directions with EOM, fixates and tracks Motor: moves all extremities, though less on right than left.  Sensory:responds to nox stim x 4.,   VPA 65  Impression: 83 yo M with SDH and recurrent seizures. His exam is improving. I would like to further characterize his MCA stenosis now that the EEG is no longer running.    Recommendations: 1) depakote 500mg  TID, daily levels 2) lacosamide 200mg  q12H 3) levetiracetam 1500mg  BID 4) CTA head.   Ritta Slot, MD [facility] [phone]  If 7pm- 7am, please page neurology on call as listed in AMION. 03/18/2018  10:51 AM

## 2018-03-19 ENCOUNTER — Inpatient Hospital Stay (HOSPITAL_COMMUNITY): Payer: Medicare Other

## 2018-03-19 LAB — CBC
HCT: 38.1 % — ABNORMAL LOW (ref 39.0–52.0)
Hemoglobin: 12.2 g/dL — ABNORMAL LOW (ref 13.0–17.0)
MCH: 34.1 pg — AB (ref 26.0–34.0)
MCHC: 32 g/dL (ref 30.0–36.0)
MCV: 106.4 fL — AB (ref 80.0–100.0)
Platelets: 384 10*3/uL (ref 150–400)
RBC: 3.58 MIL/uL — ABNORMAL LOW (ref 4.22–5.81)
RDW: 13.2 % (ref 11.5–15.5)
WBC: 12.2 10*3/uL — ABNORMAL HIGH (ref 4.0–10.5)
nRBC: 0 % (ref 0.0–0.2)

## 2018-03-19 LAB — BASIC METABOLIC PANEL
Anion gap: 12 (ref 5–15)
BUN: 10 mg/dL (ref 8–23)
CO2: 27 mmol/L (ref 22–32)
Calcium: 9 mg/dL (ref 8.9–10.3)
Chloride: 97 mmol/L — ABNORMAL LOW (ref 98–111)
Creatinine, Ser: 0.68 mg/dL (ref 0.61–1.24)
GFR calc Af Amer: >60 mL/min (ref >60)
GFR calc non Af Amer: >60 mL/min (ref >60)
GLUCOSE: 88 mg/dL (ref 70–99)
Potassium: 3.9 mmol/L (ref 3.5–5.1)
Sodium: 136 mmol/L (ref 135–145)

## 2018-03-19 LAB — GLUCOSE, CAPILLARY
GLUCOSE-CAPILLARY: 117 mg/dL — AB (ref 70–99)
Glucose-Capillary: 129 mg/dL — ABNORMAL HIGH (ref 70–99)
Glucose-Capillary: 119 mg/dL — ABNORMAL HIGH (ref 70–99)
Glucose-Capillary: 115 mg/dL — ABNORMAL HIGH (ref 70–99)
Glucose-Capillary: 96 mg/dL (ref 70–99)
Glucose-Capillary: 161 mg/dL — ABNORMAL HIGH (ref 70–99)

## 2018-03-19 LAB — VALPROIC ACID LEVEL: Valproic Acid Lvl: 63 ug/mL (ref 50.0–100.0)

## 2018-03-19 MED ORDER — VALPROIC ACID 250 MG/5ML PO SOLN
500.0000 mg | Freq: Three times a day (TID) | ORAL | Status: DC
  Administered 2018-03-19 – 2018-03-20 (×2): 500 mg
  Filled 2018-03-19 (×3): qty 10

## 2018-03-19 NOTE — Progress Notes (Signed)
Subjective: No further seizures, continues to make slow improvement  Exam: Vitals:   03/19/18 0900 03/19/18 1000  BP: (!) 155/120 129/75  Pulse:    Resp: 15 14  Temp:    SpO2:     Gen: In bed, NAD Resp: non-labored breathing, no acute distress Abd: soft, nt  Neuro: MS: awake, follows command to raise his arms, and show 2 fingers, when asked to show his thumbs he pulls them and rather than giving a thumbs up, he does wiggle his toes to command.  He tries to speak, saying "well" repeatedly and says a couple of other inappropriate words, but they are actual words. SE:LTRVUYE midline in both directions with EOM, fixates and tracks, blinks to threat bilaterally Motor: His right hemiparesis continues to improve, he is lifting all extremities against gravity with good strength Sensory:responds to nox stim x 4  VPA 63  Impression: 83 yo M with SDH and recurrent seizures.  He had marked worsening of his right hemiparesis as well as aphasia accompanied by frequent recurrent seizures.  He was on continuous monitoring until he had a 24-hour period without seizures from 2/20 until 2/21.  Since that time, he has been making gradual improvement and I would expect continued slow improvement towards a new baseline.  His stenosis was better characterized on his CTA yesterday which was relatively mild and therefore I doubt is playing a role in his current presentation  Other than continuing his antiepileptics as well as therapies, no further interventions from a neurology standpoint.   Recommendations: 1) depakote 500mg  TID, his level has been consistent, we can stop checking daily levels at this point 2) lacosamide 200mg  q12H 3) levetiracetam 1500mg  BID 4) PT, OT, ST 5) I would favor continuing his current antiepileptic regimen for at least the intermediate term until outpatient follow-up.  I have requested outpatient neurology follow-up.  We will be available as needed.  Ritta Slot,  MD [facility] [phone]  If 7pm- 7am, please page neurology on call as listed in AMION. 03/19/2018  11:07 AM

## 2018-03-19 NOTE — Progress Notes (Signed)
  NEUROSURGERY PROGRESS NOTE   No issues overnight.  Wife at bedside. No concerns this am  EXAM:  BP (!) 144/76 (BP Location: Right Arm)   Pulse 70   Temp 98.2 F (36.8 C) (Axillary)   Resp 17   Wt 71.2 kg   SpO2 94%   BMI 21.89 kg/m   Awake, alert,  Makes unintelligible noises CN grossly intact  Moves all extremities well, mild right hemiparesis  IMPRESSION/PLAN 83 y.o. male POD#9 s/p left FP crani for SDH, postop SZ. Has been SZ free >24 hrs. Aphasia largely unchanged. CTA ordered yesterday due to MCA stenosis revealed new moderate R>L pleural effusions. - Seizures: continue AED per neuro - pleural effusions: CXR ordered. May need to order echo pending results - continue therapies

## 2018-03-20 ENCOUNTER — Other Ambulatory Visit: Payer: Self-pay

## 2018-03-20 ENCOUNTER — Inpatient Hospital Stay (HOSPITAL_COMMUNITY): Payer: Medicare Other

## 2018-03-20 ENCOUNTER — Encounter (HOSPITAL_COMMUNITY): Payer: Self-pay | Admitting: *Deleted

## 2018-03-20 ENCOUNTER — Inpatient Hospital Stay (HOSPITAL_COMMUNITY)
Admission: RE | Admit: 2018-03-20 | Discharge: 2018-04-11 | DRG: 945 | Disposition: A | Discharge status: Home / Self Care | Payer: Medicare Other | Source: Intra-hospital | Attending: Physical Medicine & Rehabilitation | Admitting: Physical Medicine & Rehabilitation

## 2018-03-20 DIAGNOSIS — D638 Anemia in other chronic diseases classified elsewhere: Secondary | ICD-10-CM | POA: Diagnosis not present

## 2018-03-20 DIAGNOSIS — S065X9D Traumatic subdural hemorrhage with loss of consciousness of unspecified duration, subsequent encounter: Secondary | ICD-10-CM | POA: Diagnosis present

## 2018-03-20 DIAGNOSIS — K5900 Constipation, unspecified: Secondary | ICD-10-CM | POA: Diagnosis not present

## 2018-03-20 DIAGNOSIS — R1312 Dysphagia, oropharyngeal phase: Secondary | ICD-10-CM | POA: Diagnosis present

## 2018-03-20 DIAGNOSIS — E8809 Other disorders of plasma-protein metabolism, not elsewhere classified: Secondary | ICD-10-CM

## 2018-03-20 DIAGNOSIS — G8191 Hemiplegia, unspecified affecting right dominant side: Secondary | ICD-10-CM | POA: Diagnosis not present

## 2018-03-20 DIAGNOSIS — W19XXXD Unspecified fall, subsequent encounter: Secondary | ICD-10-CM | POA: Diagnosis not present

## 2018-03-20 DIAGNOSIS — Z8679 Personal history of other diseases of the circulatory system: Secondary | ICD-10-CM

## 2018-03-20 DIAGNOSIS — Z886 Allergy status to analgesic agent status: Secondary | ICD-10-CM | POA: Diagnosis not present

## 2018-03-20 DIAGNOSIS — D531 Other megaloblastic anemias, not elsewhere classified: Secondary | ICD-10-CM

## 2018-03-20 DIAGNOSIS — S065X9S Traumatic subdural hemorrhage with loss of consciousness of unspecified duration, sequela: Secondary | ICD-10-CM | POA: Diagnosis not present

## 2018-03-20 DIAGNOSIS — I4891 Unspecified atrial fibrillation: Secondary | ICD-10-CM

## 2018-03-20 DIAGNOSIS — D72829 Elevated white blood cell count, unspecified: Secondary | ICD-10-CM

## 2018-03-20 DIAGNOSIS — R1310 Dysphagia, unspecified: Secondary | ICD-10-CM

## 2018-03-20 DIAGNOSIS — G40909 Epilepsy, unspecified, not intractable, without status epilepticus: Secondary | ICD-10-CM | POA: Diagnosis present

## 2018-03-20 DIAGNOSIS — R4702 Dysphasia: Secondary | ICD-10-CM | POA: Diagnosis present

## 2018-03-20 DIAGNOSIS — Z681 Body mass index (BMI) 19 or less, adult: Secondary | ICD-10-CM

## 2018-03-20 DIAGNOSIS — R569 Unspecified convulsions: Secondary | ICD-10-CM

## 2018-03-20 DIAGNOSIS — S065X1S Traumatic subdural hemorrhage with loss of consciousness of 30 minutes or less, sequela: Secondary | ICD-10-CM

## 2018-03-20 DIAGNOSIS — S065X2S Traumatic subdural hemorrhage with loss of consciousness of 31 minutes to 59 minutes, sequela: Secondary | ICD-10-CM | POA: Diagnosis not present

## 2018-03-20 DIAGNOSIS — S065X0S Traumatic subdural hemorrhage without loss of consciousness, sequela: Secondary | ICD-10-CM | POA: Diagnosis not present

## 2018-03-20 DIAGNOSIS — E46 Unspecified protein-calorie malnutrition: Secondary | ICD-10-CM | POA: Diagnosis present

## 2018-03-20 DIAGNOSIS — R4701 Aphasia: Secondary | ICD-10-CM | POA: Diagnosis present

## 2018-03-20 DIAGNOSIS — D539 Nutritional anemia, unspecified: Secondary | ICD-10-CM | POA: Diagnosis present

## 2018-03-20 DIAGNOSIS — S065XAA Traumatic subdural hemorrhage with loss of consciousness status unknown, initial encounter: Secondary | ICD-10-CM | POA: Diagnosis present

## 2018-03-20 DIAGNOSIS — S065X0D Traumatic subdural hemorrhage without loss of consciousness, subsequent encounter: Secondary | ICD-10-CM | POA: Diagnosis not present

## 2018-03-20 DIAGNOSIS — S065X9A Traumatic subdural hemorrhage with loss of consciousness of unspecified duration, initial encounter: Secondary | ICD-10-CM | POA: Diagnosis present

## 2018-03-20 LAB — GLUCOSE, CAPILLARY
Glucose-Capillary: 103 mg/dL — ABNORMAL HIGH (ref 70–99)
Glucose-Capillary: 131 mg/dL — ABNORMAL HIGH (ref 70–99)
Glucose-Capillary: 106 mg/dL — ABNORMAL HIGH (ref 70–99)
Glucose-Capillary: 102 mg/dL — ABNORMAL HIGH (ref 70–99)

## 2018-03-20 LAB — BASIC METABOLIC PANEL
ANION GAP: 8 (ref 5–15)
BUN: 10 mg/dL (ref 8–23)
CHLORIDE: 98 mmol/L (ref 98–111)
CO2: 29 mmol/L (ref 22–32)
Calcium: 9 mg/dL (ref 8.9–10.3)
Creatinine, Ser: 0.69 mg/dL (ref 0.61–1.24)
GFR calc Af Amer: >60 mL/min (ref >60)
GFR calc non Af Amer: >60 mL/min (ref >60)
Glucose, Bld: 114 mg/dL — ABNORMAL HIGH (ref 70–99)
Potassium: 4.1 mmol/L (ref 3.5–5.1)
Sodium: 135 mmol/L (ref 135–145)

## 2018-03-20 LAB — CBC
HCT: 35.7 % — ABNORMAL LOW (ref 39.0–52.0)
HEMOGLOBIN: 11.8 g/dL — AB (ref 13.0–17.0)
MCH: 34.9 pg — ABNORMAL HIGH (ref 26.0–34.0)
MCHC: 33.1 g/dL (ref 30.0–36.0)
MCV: 105.6 fL — ABNORMAL HIGH (ref 80.0–100.0)
Platelets: 371 10*3/uL (ref 150–400)
RBC: 3.38 MIL/uL — ABNORMAL LOW (ref 4.22–5.81)
RDW: 13.3 % (ref 11.5–15.5)
WBC: 9.9 10*3/uL (ref 4.0–10.5)
nRBC: 0 % (ref 0.0–0.2)

## 2018-03-20 MED ORDER — DIVALPROEX SODIUM 500 MG PO DR TAB
500.0000 mg | DELAYED_RELEASE_TABLET | Freq: Once | ORAL | Status: AC
  Administered 2018-03-20: 500 mg via ORAL
  Filled 2018-03-20: qty 1

## 2018-03-20 MED ORDER — TEMAZEPAM 15 MG PO CAPS
15.0000 mg | ORAL_CAPSULE | Freq: Every day | ORAL | Status: DC
  Administered 2018-03-20 – 2018-04-10 (×22): 15 mg via ORAL
  Filled 2018-03-20 (×22): qty 1

## 2018-03-20 MED ORDER — LACOSAMIDE 50 MG PO TABS
200.0000 mg | ORAL_TABLET | Freq: Two times a day (BID) | ORAL | Status: DC
  Administered 2018-03-20 – 2018-04-11 (×44): 200 mg via ORAL
  Filled 2018-03-20 (×45): qty 4

## 2018-03-20 MED ORDER — ACETAMINOPHEN 650 MG RE SUPP: 650.0000 mg | RECTAL | Status: DC | PRN

## 2018-03-20 MED ORDER — ONDANSETRON HCL 4 MG PO TABS: 4.0000 mg | ORAL_TABLET | ORAL | Status: DC | PRN

## 2018-03-20 MED ORDER — DILTIAZEM HCL 60 MG PO TABS
60.0000 mg | ORAL_TABLET | Freq: Four times a day (QID) | ORAL | Status: DC
  Administered 2018-03-20 – 2018-03-28 (×30): 60 mg via NASOGASTRIC
  Filled 2018-03-20 (×31): qty 1

## 2018-03-20 MED ORDER — ONDANSETRON HCL 4 MG/2ML IJ SOLN: 4.0000 mg | INTRAMUSCULAR | Status: DC | PRN

## 2018-03-20 MED ORDER — DOCUSATE SODIUM 100 MG PO CAPS
100.0000 mg | ORAL_CAPSULE | Freq: Two times a day (BID) | ORAL | Status: DC
  Administered 2018-03-20 – 2018-03-29 (×18): 100 mg via ORAL
  Filled 2018-03-20 (×18): qty 1

## 2018-03-20 MED ORDER — HYDROCODONE-ACETAMINOPHEN 5-325 MG PO TABS
1.0000 | ORAL_TABLET | ORAL | Status: DC | PRN
  Administered 2018-04-07 – 2018-04-09 (×2): 1 via ORAL
  Filled 2018-03-20 (×2): qty 1

## 2018-03-20 MED ORDER — B COMPLEX-C PO TABS
1.0000 | ORAL_TABLET | Freq: Every day | ORAL | Status: DC
  Administered 2018-03-21 – 2018-04-11 (×22): 1 via ORAL
  Filled 2018-03-20 (×22): qty 1

## 2018-03-20 MED ORDER — VALPROIC ACID 250 MG/5ML PO SOLN: 500.0000 mg | Freq: Three times a day (TID) | ORAL | 1 refills | Status: DC

## 2018-03-20 MED ORDER — SERTRALINE HCL 100 MG PO TABS
100.0000 mg | ORAL_TABLET | Freq: Every day | ORAL | Status: DC
  Administered 2018-03-21 – 2018-04-11 (×22): 100 mg via ORAL
  Filled 2018-03-20 (×22): qty 1

## 2018-03-20 MED ORDER — CALCIUM POLYCARBOPHIL 625 MG PO TABS
625.0000 mg | ORAL_TABLET | Freq: Every day | ORAL | Status: DC
  Administered 2018-03-21 – 2018-04-11 (×22): 625 mg via ORAL
  Filled 2018-03-20 (×22): qty 1

## 2018-03-20 MED ORDER — DILTIAZEM HCL 60 MG PO TABS: 60.0000 mg | ORAL_TABLET | Freq: Four times a day (QID) | ORAL | 0 refills | Status: DC

## 2018-03-20 MED ORDER — ACETAMINOPHEN 325 MG PO TABS
650.0000 mg | ORAL_TABLET | ORAL | Status: DC | PRN
  Administered 2018-03-28 – 2018-04-09 (×11): 650 mg via ORAL
  Filled 2018-03-20 (×11): qty 2

## 2018-03-20 MED ORDER — LEVETIRACETAM 100 MG/ML PO SOLN
1500.0000 mg | Freq: Two times a day (BID) | ORAL | Status: DC
  Administered 2018-03-20 – 2018-04-11 (×44): 1500 mg via ORAL
  Filled 2018-03-20 (×44): qty 15

## 2018-03-20 MED ORDER — LACOSAMIDE 200 MG PO TABS: 200.0000 mg | ORAL_TABLET | Freq: Two times a day (BID) | ORAL | 1 refills | Status: DC

## 2018-03-20 MED ORDER — PRO-STAT SUGAR FREE PO LIQD
30.0000 mL | Freq: Every day | ORAL | Status: DC
  Administered 2018-03-21: 30 mL
  Filled 2018-03-20: qty 30

## 2018-03-20 MED ORDER — VALPROIC ACID 250 MG/5ML PO SOLN
500.0000 mg | Freq: Three times a day (TID) | ORAL | Status: DC
  Administered 2018-03-20 – 2018-03-28 (×23): 500 mg
  Filled 2018-03-20 (×2): qty 10
  Filled 2018-03-20: qty 5
  Filled 2018-03-20 (×23): qty 10

## 2018-03-20 MED ORDER — ATORVASTATIN CALCIUM 10 MG PO TABS
20.0000 mg | ORAL_TABLET | Freq: Every day | ORAL | Status: DC
  Administered 2018-03-21 – 2018-04-11 (×22): 20 mg via ORAL
  Filled 2018-03-20 (×22): qty 2

## 2018-03-20 MED ORDER — LEVETIRACETAM 100 MG/ML PO SOLN: 1500.0000 mg | Freq: Two times a day (BID) | ORAL | 12 refills | Status: DC

## 2018-03-20 MED ORDER — OSMOLITE 1.2 CAL PO LIQD
1000.0000 mL | ORAL | Status: DC
  Filled 2018-03-20 (×2): qty 1000

## 2018-03-20 NOTE — Progress Notes (Addendum)
Inpatient Rehabilitation Admissions Coordinator  Inpatient rehab consult received.  I met with patient and his wife at bedside. Patient with expressive aphasia. We dicussed goals and expectations of an inpt rehab admit. Wife in agreement. I will contact Dr. Arnoldo Morale to clarify if pt medically ready to d/c to CIR today. Patient for MBS at 1000. We can admit today if medically cleared for d/c.  Danne Baxter, RN, MSN Rehab Admissions Coordinator [phone] 03/20/2018 9:48 AM  I spoke with Dr, Arnoldo Morale and we can admit to CIR today. RNCM and SW made aware.

## 2018-03-20 NOTE — PMR Pre-admission (Addendum)
PMR Admission Coordinator Pre-Admission Assessment  Patient: Alvin Hudson is an 83 y.o., male MRN: [id number] DOB: 09-07-1935 Height:   Weight: 65.4 kg  Insurance Information HMO:     PPO:      PCP:      IPA:      80/20:      OTHER: no HMO PRIMARY: Medicare a and   b   Policy#: [id number]      Subscriber: pt Benefits:  Phone #: passport one online     Name: 03/20/2018 Eff. Date: a 05/26/2002 b 07/26/2003     Deduct: $1408      Out of Pocket Max: none      Life Max: none CIR: 100%      SNF: 20 full days Outpatient: 80%     Co-Pay: 20% Home Health: 100%      Co-Pay: none DME: 80%     Co-Pay: 20% Providers: pt choice  SECONDARY: AARP supplement      Policy#: [id number]      Subscriber: pt   Medicaid Application Date:       Case Manager:  Disability Application Date:       Case Worker:   Emergency Contact Information Contact Information    Name Relation Home Work Mobile   Bauder,Shelby Spouse   [phone]   Maren Reamer Daughter   [phone]      Current Medical History  Patient Admitting Diagnosis: Left Extra-axial hematoma  History of Present Illness: Alvin Hudson is an 83 year old right handed male with history of seizure 2 years ago maintained on Keppra followed by Dr. Philippa Chester at [facility]. Initially presented 03/10/2018 after a recent fall initial cranial CT scan negative.  He developed progressive headaches. He was taken to [facility] or scans were obtained attempts initially made to transfer to [place] at [facility] but they could not accommodate him because the ICU for full. X-rays and imaging follow-up  at [facility] showed acute left subdural hematoma. Patient was transferred to [facility] and underwent left frontotemporal parietal craniotomy for evacuation of subdural hematoma 03/10/2018 per Dr. Newman Pies. Patient progressed nicely and initial therapy evaluation and ambulating 500 feet without assistive device and was discharged home 03/12/2018.  Patient was readmitted same day after family reports decrease in mobility as well as speech difficulty and dysphagia. 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 No intracranial large vessel occlusion. 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 Was an incidental note of moderate right small left pleural effusions with findings of pulmonary edema with follow-up chest x-ray showing small bilateral pleural effusions and bibasilar atelectasis no change in plan of care. Noted seizure on 03/15/2018.Follow-up EEGs negative for seizure. Currently maintained on Keppra, valproic acid as well as Vimpat for seizure prophylaxis.plans to currently continue diet epileptic regimen until follow-up as outpatient with neurology.  Patient new-onset A. Fib RVR initially placed on Cardizem drip transition to Cardizem. Troponin mildly elevated 0.04-0.15 felt to be related to demand ischemia. Echocardiogram with ejection fraction of 65%. Normal systolic function. Patient is currently NPO with nasogastric tube in place for nutritional support with follow-up modified barium swallow completed 03/20/2018 placed on a dysphagia #2 thin liquid diet.  Patient's medical record from [facility] has been reviewed by the rehabilitation admission coordinator and physician.  Past Medical History  Past Medical History:  Diagnosis Date  . Allergy   . Seizures ([place])     Family History   family history is not on file.  Prior Rehab/Hospitalizations Has the patient had major surgery during 100 days prior to admission? No    Current Medications  Current Facility-Administered Medications:  .   0.9 %  sodium chloride infusion, , Intravenous, Continuous, Rosalin Hawking, MD, Last Rate: 50 mL/hr at 03/20/18 0800 .  acetaminophen (TYLENOL) tablet 650 mg, 650 mg, Oral, Q4H PRN **OR** acetaminophen (TYLENOL) suppository 650 mg, 650 mg, Rectal, Q4H PRN, Judith Part, MD, 650 mg at 03/14/18 1232 .  atorvastatin (LIPITOR) tablet 20 mg, 20 mg, Oral, Daily, Ostergard, Thomas A, MD, 20 mg at 03/20/18 1144 .  B-complex with vitamin C tablet 1 tablet, 1 tablet, Oral, Daily, Newman Pies, MD, 1 tablet at 03/19/18 1059 .  chlorhexidine (PERIDEX) 0.12 % solution 15 mL, 15 mL, Mouth Rinse, BID, Meyran, Ocie Cornfield, NP, 15 mL at 03/19/18 2140 .  diltiazem (CARDIZEM) tablet 60 mg, 60 mg, Per NG tube, Q6H, Belva Crome, MD, 60 mg at 03/20/18 0537 .  docusate sodium (COLACE) capsule 100 mg, 100 mg, Oral, BID, Ostergard, Joyice Faster, MD, 100 mg at 03/20/18 1146 .  feeding supplement (OSMOLITE 1.2 CAL) liquid 1,000 mL, 1,000 mL, Per Tube, Continuous, Rosalin Hawking, MD, Last Rate: 60 mL/hr at 03/19/18 2056, 1,000 mL at 03/19/18 2056 .  feeding supplement (PRO-STAT SUGAR FREE 64) liquid 30 mL, 30 mL, Per Tube, Daily, Rosalin Hawking, MD, 30 mL at 03/19/18 1101 .  haloperidol lactate (HALDOL) injection 2 mg, 2 mg, Intravenous, Q6H PRN, Rosalin Hawking, MD, 2 mg at 03/13/18 1517 .  HYDROcodone-acetaminophen (NORCO/VICODIN) 5-325 MG per tablet 1 tablet, 1 tablet, Oral, Q4H PRN, Ostergard, Thomas A, MD .  labetalol (NORMODYNE,TRANDATE) injection 10-40 mg, 10-40 mg, Intravenous, Q10 min PRN, Judith Part, MD .  lacosamide (VIMPAT) tablet 200 mg, 200 mg, Oral, BID, Rosalin Hawking, MD, 200 mg at 03/20/18 1146 .  levETIRAcetam (KEPPRA) 100 MG/ML solution 1,500 mg, 1,500 mg, Oral, BID, Newman Pies, MD, 1,500 mg at 03/19/18 2143 .  MEDLINE mouth rinse, 15 mL, Mouth Rinse, q12n4p, Meyran, Ocie Cornfield, NP, 15 mL at 03/19/18 1614 .  ondansetron (ZOFRAN) tablet 4 mg, 4 mg, Oral, Q4H PRN **OR** ondansetron (ZOFRAN)  injection 4 mg, 4 mg, Intravenous, Q4H PRN, Ostergard, Joyice Faster, MD .  polycarbophil (FIBERCON) tablet 625 mg, 625 mg, Oral, Daily, Ostergard, Thomas A, MD, 625 mg at 03/20/18 1145 .  sertraline (ZOLOFT) tablet 100 mg, 100 mg, Oral, Daily, Ostergard, Thomas A, MD, 100 mg at 03/20/18 1144 .  temazepam (RESTORIL) capsule 15 mg, 15 mg, Oral, QHS, Ostergard, Joyice Faster, MD, 15 mg at 03/19/18 2142 .  valproic acid (DEPAKENE) solution 500 mg, 500 mg, Per Tube, Q8H, Newman Pies, MD, 500 mg at 03/20/18 2426  Patients Current Diet:   Diet Order            DIET DYS 2 Room service appropriate? 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 Self Care: Did the patient need help bathing, dressing, using the toilet or eating?  Independent  Indoor Mobility: Did the patient need assistance with walking from room to room (with or without device)? Independent  Stairs: Did the patient need assistance with internal or external stairs (with or without device)? Independent  Functional Cognition: Did the patient need help planning regular tasks such as shopping or remembering to take medications? Independent  Home Assistive Devices / Equipment Home Equipment: None  Prior Device Use: Indicate devices/aids used by the patient prior to current illness, exacerbation or injury? 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   Prior Functional Level Current Functional Level  Bed Mobility  Independent Mod assist with HOB elevated  Transfers  independent Mod assist  Mobility - Walk/Wheelchair independent    Mobility - Ambulation/Gait  independent Mod assist, +2 safety/equipment 45 feet with no assist device  Upper Body Dressing  independent Maximal assistance, Sitting  Lower Body Dressing independent Maximal assistance, Sit to/from stand  Grooming independent Moderate assistance  Eating/Drinking independent NPO with cortrak  Toilet Transfer independent Moderate assistance, +2 for physical assistance, Ambulation, Comfort height toilet  Bladder Continence continent   external catheter  Bowel Management  continent   incontinent  Stair Climbing independent   not attempted  Communication intact Expressive difficulties, HOH  Memory intact  impaired  Cooking/Meal Prep  independent     Housework  Pt does most of housework for wife has back issues   Money Management  independent   Driving  yes     Special needs/care consideration BiPAP/CPAP n/a CPM n/a Continuous Drip IV n/a Dialysis n/a Life Vest n/a Oxygen n/a Special Bed seizure precautions Trach Size n/a Wound Vac n/a Skin head surgical wound with staples Bowel mgmt: incontinent LBM 2/23 Bladder mgmt: external catheter Diabetic mgmt n/a Seizure precautions Cortrak bridled 03/15/2018  Previous Home Environment Living Arrangements: Spouse/significant other  Lives With: Spouse Available Help at Discharge: Family, Available 24 hours/day Type of Home: House Home Layout: Two level, Able to live on main level with bedroom/bathroom Alternate Level Stairs-Number of Steps: flight Home Access: Stairs to enter Entrance Stairs-Rails: None Entrance Stairs-Number of Steps: 3 Bathroom Shower/Tub: Chiropodist: Standard Bathroom Accessibility: Yes How Accessible: Accessible via walker Home Care Services:  No  Discharge Living Setting Plans for Discharge Living Setting: Patient's home, Lives with (comment)(wife) Type of Home at Discharge: House Discharge Home Layout: Two level, Able to live on main level with bedroom/bathroom Alternate Level Stairs-Number of Steps: flight Discharge Home Access: Stairs to enter Entrance Stairs-Rails: None Entrance Stairs-Number of Steps: 3 Discharge Bathroom Shower/Tub: Tub/shower unit Discharge Bathroom Toilet: Standard Discharge Bathroom Accessibility: Yes How Accessible: Accessible via walker Does the patient have any problems obtaining your medications?: No  Social/Family/Support Systems Patient Roles: Spouse, Parent Contact Information: wife, Wilburn Cornelia Anticipated Caregiver: wife and daughter Anticipated Caregiver's Contact Information: [phone] Ability/Limitations of Caregiver: wife has a bad back Caregiver Availability: 24/7 Discharge Plan Discussed with Primary Caregiver: Yes Is Caregiver In Agreement with Plan?: Yes Does Caregiver/Family have Issues with Lodging/Transportation while Pt is in Rehab?: No  Goals/Additional Needs Patient/Family Goal for Rehab: supervision  to min asisst with PT, OT, and SLP Expected length of stay: ELOS 14 to 20 days Dietary Needs: cortrak Pt/Family Agrees to Admission and willing to participate: Yes Program Orientation Provided & Reviewed with Pt/Caregiver Including Roles  & Responsibilities: Yes  Patient Condition: I have reviewed medical records from [facility] , spoken with patient and wife at bedside. I met with patient at the bedside for inpatient rehabilitation assessment.  Patient will benefit from ongoing PT, OT, and SLP, can actively participate in 3 hours of therapy a day 5 days of the week, and can make measurable gains during the admission.  Patient will also benefit from the coordinated team approach during an Inpatient Acute Rehabilitation admission.  The patient will receive intensive  therapy as well as Rehabilitation physician, nursing, social worker, and care management interventions.  Due to bowel management, bladder management, safety, skin/wound care, disease management, medical administration, pain management, patient education the patient requires 24 hour a day rehabilitation nursing.  The patient is currently mod assist with mobility and basic ADLs.  Discharge setting and therapy post discharge at  home with home health is anticipated.  Patient/wife has agreed to participate in the Acute Inpatient Rehabilitation Program and will admit today.  Preadmission Screen Completed By:  Cleatrice Burke RN MSN, 03/20/2018 11:47 AM ______________________________________________________________________   Discussed status with Dr. Posey Pronto  on  03/20/2018  at  36 and received telephone approval for admission today.  Admission Coordinator:  Cleatrice Burke RN MSN, time  1200 Date 03/20/2018   Assessment/Plan: Diagnosis: Left extra-axial hematoma Images reviewed (left frontal SDH) and labs reviewed.  1. Does the need for close, 24 hr/day  Medical supervision in concert with the patient's rehab needs make it unreasonable for this patient to be served in a less intensive setting? Yes  2. Co-Morbidities requiring supervision/potential complications:  Seizure, allergy, a. Fib, acute blood loss anemia 3. Due to bladder management, bowel management, safety, skin/wound care, disease management, medication administration, pain management and patient education, does the patient require 24 hr/day rehab nursing? Yes 4. Does the patient require coordinated care of a physician, rehab nurse, PT (1-2 hrs/day, 5 days/week), OT (1-2 hrs/day, 5 days/week) and SLP (1-2 hrs/day, 5 days/week) to address physical and functional deficits in the context of the above medical diagnosis(es)? Yes Addressing deficits in the following areas: balance, endurance, locomotion, strength, transferring, bathing,  dressing, toileting, cognition, speech, language, swallowing and psychosocial support 5. Can the patient actively participate in an intensive therapy program of at least 3 hrs of therapy 5 days a week? Yes 6. The potential for patient to make measurable gains while on inpatient rehab is excellent 7. Anticipated functional outcomes upon discharge from inpatients are: supervision and min assist PT, supervision and min assist OT, supervision and min assist SLP 8. Estimated rehab length of stay to reach the above functional goals is: 17-20 days. 9. Anticipated D/C setting: Home 10. Anticipated post D/C treatments: HH therapy and Home excercise program 11. Overall Rehab/Functional Prognosis: good  Cleatrice Burke RN MSN Admissions coordinator 03/20/2018  Delice Lesch, MD, ABPMR

## 2018-03-20 NOTE — Evaluation (Signed)
Occupational Therapy Assessment and Plan  Patient Details  Name: Alvin Hudson MRN: [id number] Date of Birth: 1935/09/27  OT Diagnosis: apraxia, ataxia, cognitive deficits, hemiplegia affecting dominant side and muscle weakness (generalized) Rehab Potential: Rehab Potential (ACUTE ONLY): Good ELOS: 14-18 days   Today's Date: 03/21/2018 OT Individual Time: 1100-1155 OT Individual Time Calculation (min): 55 min     Problem List:  Patient Active Problem List   Diagnosis Date Noted  . Anemia of chronic disease   . Megaloblastic anemia   . Hypoalbuminemia due to protein-calorie malnutrition ([place])   . Leukocytosis   . New onset atrial fibrillation ([place])   . Seizures ([place])   . Traumatic subdural hematoma ([place]) 03/20/2018  . History of subdural hematoma   . Dysphagia   . Atrial fibrillation ([place]) 03/17/2018  . Aphasia 03/12/2018  . Seizure ([place]) 03/12/2018  . Subdural hematoma ([place]) 03/10/2018    Past Medical History:  Past Medical History:  Diagnosis Date  . Allergy   . Seizures ([place])    Past Surgical History:  Past Surgical History:  Procedure Laterality Date  . CRANIOTOMY Left 03/10/2018   Procedure: CRANIOTOMY HEMATOMA EVACUATION SUBDURAL;  Surgeon: Newman Pies, MD;  Location: [place];  Service: Neurosurgery;  Laterality: Left;  . INGUINAL HERNIA REPAIR Bilateral     Assessment & Plan Clinical Impression: Patient is a 83 y.o. 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 PMhx: Sz. Patient transferred to CIR on 03/20/2018 .    Patient currently requires Moderate assistance with basic self-care skills secondary to muscle weakness, impaired timing and sequencing, unbalanced muscle activation, motor apraxia, ataxia, decreased coordination and decreased motor planning, decreased midline orientation, decreased attention to right and decreased  motor planning, decreased initiation, decreased attention, decreased awareness, decreased problem solving, decreased safety awareness, decreased memory and delayed processing and decreased sitting balance, decreased standing balance, decreased postural control, hemiplegia and decreased balance strategies.  Prior to hospitalization, patient could complete BADLs independent .  Patient will benefit from skilled intervention to increase independence with basic self-care skills prior to discharge home with care partner.  Anticipate patient will require 24 hour supervision and follow up home health.  OT - End of Session Endurance Deficit: Yes Endurance Deficit Description: Multiple rest breaks within BADL tasks OT Assessment Rehab Potential (ACUTE ONLY): Good OT Patient demonstrates impairments in the following area(s): Balance;Cognition;Endurance;Motor;Perception;Safety;Sensory OT Basic ADL's Functional Problem(s): Eating;Grooming;Bathing;Dressing;Toileting OT Transfers Functional Problem(s): Toilet;Tub/Shower OT Additional Impairment(s): Fuctional Use of Upper Extremity OT Plan OT Intensity: Minimum of 1-2 x/day, 45 to 90 minutes OT Frequency: 5 out of 7 days OT Duration/Estimated Length of Stay: 14-18 days OT Treatment/Interventions: Balance/vestibular training;Cognitive remediation/compensation;Community reintegration;Discharge planning;Disease mangement/prevention;DME/adaptive equipment instruction;Functional mobility training;Neuromuscular re-education;Patient/family education;Psychosocial support;Self Care/advanced ADL retraining;Therapeutic Activities;Therapeutic Exercise;UE/LE Strength taining/ROM;UE/LE Coordination activities;Visual/perceptual remediation/compensation;Wheelchair propulsion/positioning OT Self Feeding Anticipated Outcome(s): Supervision OT Basic Self-Care Anticipated Outcome(s): Supervision OT Toileting Anticipated Outcome(s): Supervision OT Bathroom Transfers Anticipated  Outcome(s): Supervision OT Recommendation Patient destination: Home Follow Up Recommendations: Home health OT Equipment Recommended: To be determined   Skilled Therapeutic Intervention Pt greeted semi-reclined in bed and agreeable to OT treatment session. Pt came to sitting EOB with increased time and min A. Squat-pivot to wc with mod A. Pt brought to the sink for BADL tasks. Pt with apraxia and difficulty motor planning with R hand. Pt often undershooting with R UE when reaching for  objects. Toothbrush task with max A and verbal cues for initiation, sequencing, and termination of task. Pt also unable to place toothpaste on toothbrush using R hand and brushed too hard. Pt also had difficulty with forward and back motion to brush teeth efficiently. Sit<>stand at the sink with mod A, the min A for balance when reaching to wash bottom. Pt needed mod cues to initiate washing bottom. Pt needed extended rest break after BADL tasks. Pt brought to dynavision room and worked on R UE finger isolation and accuracy as well as visual scanning. Pt slower to locate lights on R side. Pt returned to room and did not want to stay up in the wc despite encouragement. Stand-pivot back to bed with mod A. Pt left semi-reclined in bed with bed alarm on and needs met.   OT Evaluation Precautions/Restrictions  Precautions Precautions: Fall Restrictions Weight Bearing Restrictions: No Pain Pain Assessment Faces Pain Scale: No hurt Home Living/Prior Functioning Home Living Family/patient expects to be discharged to:: Private residence Living Arrangements: Spouse/significant other Available Help at Discharge: Family, Available 24 hours/day Type of Home: House Home Access: Stairs to enter [employer] of Steps: 4 Entrance Stairs-Rails: Right Home Layout: Two level, Able to live on main level with bedroom/bathroom Alternate Level Stairs-Number of Steps: flight Bathroom Shower/Tub: Archivist: Yes  Lives With: Spouse Prior Function Level of Independence: Independent with basic ADLs, Independent with homemaking with ambulation  Able to Take Stairs?: Yes Driving: Yes Vocation: 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, drives and independent with BADLs PTA ADL ADL Eating: Unable to assess Grooming: Maximal cueing, Moderate assistance Upper Body Bathing: Moderate cueing, Moderate assistance Lower Body Bathing: Maximal assistance, Moderate cueing Upper Body Dressing: Moderate cueing, Moderate assistance Lower Body Dressing: Moderate cueing, Maximal assistance Toileting: Unable to assess Toilet Transfer: Moderate assistance Social research officer, government: Unable to assess Vision Additional Comments: Need to further assess Perception  Perception: Impaired Inattention/Neglect: Does not attend to right side of body Praxis Praxis: Impaired Praxis Impairment Details: Ideation;Initiation;Motor planning;Ideomotor Cognition Overall Cognitive Status: Impaired/Different from baseline Arousal/Alertness: Awake/alert Orientation Level: Nonverbal/unable to assess(UTA 2/2 aphasia ) Year: (UTA) Month: (UTA) Day of Week: (UTA) Memory: Impaired Immediate Memory Recall: Blue;Sock;Bed(Unable to repeat words or administer BIMS 2/2 aphasia) Sustained Attention: Impaired Sustained Attention Impairment: Verbal basic;Functional basic Awareness: Impaired Awareness Impairment: Emergent impairment Problem Solving: Impaired Behaviors: Restless Safety/Judgment: Impaired Rancho Los Amigos Scales of Cognitive Functioning: Confused/appropriate Sensation Sensation Light Touch: Impaired Detail Light Touch Impaired Details: Impaired RLE;Impaired RUE Proprioception: Impaired Detail Proprioception Impaired Details: Impaired RLE;Impaired RUE Coordination Gross Motor Movements are Fluid and Coordinated: No Fine Motor Movements are Fluid and Coordinated: No Coordination and  Movement Description: mild Rt hemiplegia, decrease smoothness and accuracy of R UE Finger Nose Finger Test: UTA` Motor  Motor Motor: Hemiplegia Motor - Skilled Clinical Observations: Rt hemiplegia Mobility  Bed Mobility Bed Mobility: Supine to Sit Supine to Sit: Supervision/Verbal cueing  Trunk/Postural Assessment  Cervical Assessment Cervical Assessment: (fwd head) Thoracic Assessment Thoracic Assessment: (mild kyphosis) Lumbar Assessment Lumbar Assessment: (posterior pelvic tilt) Postural Control Postural Control: Deficits on evaluation Righting Reactions: delayed  Balance Dynamic Sitting Balance Sitting balance - Comments: min guard Dynamic Standing Balance Dynamic Standing - Comments: mod A Extremity/Trunk Assessment RUE Assessment RUE Assessment: Exceptions to [facility] General Strength Comments: 3+/5, deficits in coordination, motor planning, and proprioception RUE Body System: Neuro Brunstrum levels for arm and hand: Arm;Hand Brunstrum level for arm: Stage V Relative Independence from Synergy Brunstrum  level for hand: Stage VI Isolated joint movements LUE Assessment LUE Assessment: Within Functional Limits     Refer to Care Plan for Long Term Goals  Recommendations for other services: None    Discharge Criteria: Patient will be discharged from OT if patient refuses treatment 3 consecutive times without medical reason, if treatment goals not met, if there is a change in medical status, if patient makes no progress towards goals or if patient is discharged from hospital.  The above assessment, treatment plan, treatment alternatives and goals were discussed and mutually agreed upon: No family available/patient unable  Valma Cava 03/21/2018, 12:56 PM

## 2018-03-20 NOTE — Progress Notes (Signed)
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 PMhx: Sz    PT Comments    Pt is progressing extremely well. Pt able to follow increased commands today and required less cuing for initiation of mobility tasks. Pt able to progress to standing balance activities. Pt would benefit from continued skilled therapy to further progress balance, strength, gait and stair training in order to improve independence and decrease burden of care.  Follow Up Recommendations  CIR;Supervision/Assistance - 24 hour     Equipment Recommendations  Rolling walker with 5" wheels    Recommendations for Other Services       Precautions / Restrictions Precautions Precautions: Fall Restrictions Weight Bearing Restrictions: No    Mobility  Bed Mobility Overal bed mobility: Needs Assistance Bed Mobility: Supine to Sit     Supine to sit: HOB elevated;Min guard     General bed mobility comments: cues for initiating   Transfers Overall transfer level: Needs assistance Equipment used: Rolling walker (2 wheeled) Transfers: Sit to/from Stand(x3 trials, bed and two from chair) Sit to Stand: Min assist         General transfer comment: cues for sequencing, increased time, cues for hand placement  Ambulation/Gait Ambulation/Gait assistance: Min assist Gait Distance (Feet): 150 Feet Assistive device: Rolling walker (2 wheeled) Gait Pattern/deviations: Step-through pattern;Decreased stride length;Narrow base of support Gait velocity: decreased   General Gait Details: pt improved to min A x1 for gait with RW, without walker pt is mod A x1 for ambulation, pt ambulated ~38ft without RW and was reaching for the RW the entire  time   Stairs             Wheelchair Mobility    Modified Rankin (Stroke Patients Only)       Balance Overall balance assessment: Needs assistance Sitting-balance support: No upper extremity supported;Feet supported Sitting balance-Leahy Scale: Fair Sitting balance - Comments: min guard to sit EOB   Standing balance support: No upper extremity supported Standing balance-Leahy Scale: Fair Standing balance comment: pt able to stand without support and narrow base of support for 25 seconds and 55 seconds after seated rest                             Cognition Arousal/Alertness: Awake/alert Behavior During Therapy: Flat affect Overall Cognitive Status: Impaired/Different from baseline Area of Impairment: Attention;Following commands;Safety/judgement;Awareness;Problem solving               Rancho Levels of Cognitive Functioning Rancho Los Amigos Scales of Cognitive Functioning: Confused/appropriate   Current Attention Level: Sustained   Following Commands: Follows one step commands inconsistently;Follows one step commands with increased time Safety/Judgement: Decreased awareness of safety;Decreased awareness of deficits Awareness: Intellectual Problem Solving: Slow processing;Decreased initiation;Difficulty sequencing;Requires verbal cues;Requires tactile cues General Comments: pt able to follow commands better today with less time, pt able to count reps correctly today with one leg and count correctly on the second attempt with the other leg      Exercises Total Joint Exercises Long Arc Quad: AROM;Both;10 reps;Seated(pt able to correctly count 10 reps on left, counted 6 on right leg but correctly counted the last 4)    General Comments  Pertinent Vitals/Pain Pain Assessment: No/denies pain Faces Pain Scale: Hurts a little bit Pain Intervention(s): Monitored during session    Home Living                      Prior Function             PT Goals (current goals can now be found in the care plan section) Progress towards PT goals: Progressing toward goals    Frequency    Min 4X/week      PT Plan Current plan remains appropriate    Co-evaluation              AM-PAC PT "6 Clicks" Mobility   Outcome Measure  Help needed turning from your back to your side while in a flat bed without using bedrails?: A Little Help needed moving from lying on your back to sitting on the side of a flat bed without using bedrails?: A Little Help needed moving to and from a bed to a chair (including a wheelchair)?: A Little Help needed standing up from a chair using your arms (e.g., wheelchair or bedside chair)?: A Little Help needed to walk in hospital room?: A Lot Help needed climbing 3-5 steps with a railing? : A Lot 6 Click Score: 16    End of Session Equipment Utilized During Treatment: Gait belt Activity Tolerance: Patient tolerated treatment well Patient left: in chair;with call bell/phone within reach;with chair alarm set;with family/visitor present Nurse Communication: Mobility status PT Visit Diagnosis: Unsteadiness on feet (R26.81);History of falling (Z91.81);Other symptoms and signs involving the nervous system (R29.898);Apraxia (R48.2);Difficulty in walking, not elsewhere classified (R26.2)     Time: 5056-9794 PT Time Calculation (min) (ACUTE ONLY): 35 min  Charges:  $Gait Training: 8-22 mins $Neuromuscular Re-education: 8-22 mins                     Ardene Remley, [place] [phone]    Simisola Sandles 03/20/2018, 12:12 PM

## 2018-03-20 NOTE — Progress Notes (Signed)
Patient ID: Alvin Hudson, male   DOB: [birth year], 83 y.o.   MRN: [id number] Patient and wife arrived with RN from [facility]. Patient and wife oriented to floor and assessment complete. All questions answered for patients wife. Patients call light in reach Lorri Frederick, LPN

## 2018-03-20 NOTE — Progress Notes (Signed)
Modified Barium Swallow Progress Note  Patient Details  Name: Alvin Hudson MRN: [id number] Date of Birth: July 01, 1935  Today's Date: 03/20/2018  Modified Barium Swallow completed.  Full report located under Chart Review in the Imaging Section.  Brief recommendations include the following:  Clinical Impression  Pt demonstrated mild oropharyngeal dysphagia marked by transient and flash laryngeal penetration. Anterior bolus loss on right due to decreased containment in addition to episode of penetration close to cords before full engagement of swallow that was ejected during the swallow. Flash penetration with larger consecutive sips thin (cup and straw) observed not posing great aspiration risk during this study. During majority of swallows epiglottis was able to invert past Cortrak tube however tube appeared to prevent full deflection leaving mild-moderate vallecular residue with regular and intermittently with thin. 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 Thin liquids with cups (avoid straws for first 2-3 days), pills whole in applesauce, full supervision and check right side for pocketed food.       Swallow Evaluation Recommendations       SLP Diet Recommendations: Dysphagia 2 (Fine chop) solids;Thin liquid   Liquid Administration via: Cup;No straw   Medication Administration: Whole meds with puree   Supervision: Patient able to self feed;Staff to assist with self feeding;Full supervision/cueing for compensatory strategies   Compensations: Minimize environmental distractions;Slow rate;Small sips/bites;Multiple dry swallows after each bite/sip   Postural Changes: Seated upright at 90 degrees   Oral Care Recommendations: Oral care BID        [name] 03/20/2018,1:45 PM   [name] [place].Ed Nurse, children's [phone] Office  [phone]

## 2018-03-20 NOTE — Progress Notes (Signed)
Patients staples were removed per MD order. Site was clean, dry and intact. Patient tolerated removal well.

## 2018-03-20 NOTE — Progress Notes (Signed)
Subjective: The patient is alert and pleasant.  His wife is at the bedside.  He is in no apparent distress.  Objective: Vital signs in last 24 hours: Temp:  [97.9 F (36.6 C)-98.9 F (37.2 C)] 97.9 F (36.6 C) (02/24 0800) Pulse Rate:  [68-84] 73 (02/24 0800) Resp:  [8-24] 19 (02/24 0800) BP: (98-168)/(50-139) 123/95 (02/24 0800) SpO2:  [70 %-100 %] 98 % (02/24 0800) Weight:  [65.4 kg] 65.4 kg (02/24 0400) Estimated body mass index is 20.11 kg/m as calculated from the following:   Height as of 03/10/18: 5\' 11"  (1.803 m).   Weight as of this encounter: 65.4 kg.   Intake/Output from previous day: 02/23 0701 - 02/24 0700 In: 1782 [I.V.:1182; NG/GT:600] Out: 1300 [Urine:1300] Intake/Output this shift: Total I/O In: 50 [I.V.:50] Out: -   Physical exam the patient is alert, pleasant and attentive.  He follows commands.  He will answer yes and no and date his wife's name.  He is moving all 4 extremities well.  His wound is healing well.  Lab Results: Recent Labs    03/19/18 0544 03/20/18 0542  WBC 12.2* 9.9  HGB 12.2* 11.8*  HCT 38.1* 35.7*  PLT 384 371   BMET Recent Labs    03/19/18 0544 03/20/18 0542  NA 136 135  K 3.9 4.1  CL 97* 98  CO2 27 29  GLUCOSE 88 114*  BUN 10 10  CREATININE 0.68 0.69  CALCIUM 9.0 9.0    Studies/Results: Ct Angio Head W Or Wo Contrast  Result Date: 03/18/2018 CLINICAL DATA:  Follow up stroke. Status post subdural hematoma evacuation March 10, 2018. History of seizures and atrial fibrillation. EXAM: CT ANGIOGRAPHY HEAD AND NECK TECHNIQUE: Multidetector CT imaging of the head and neck was performed using the standard protocol during bolus administration of intravenous contrast. Multiplanar CT image reconstructions and MIPs were obtained to evaluate the vascular anatomy. Carotid stenosis measurements (when applicable) are obtained utilizing NASCET criteria, using the distal internal carotid diameter as the denominator. CONTRAST:   ISOVUE-370 IOPAMIDOL (ISOVUE-370) INJECTION 76% COMPARISON:  MRI/MRA head March 13, 2018 and CT HEAD March 10, 2018. FINDINGS: CT HEAD FINDINGS BRAIN: Status post evacuation of LEFT subdural hematoma with 6 mm residual low-density LEFT frontal component. Trace LEFT falcotentorial subdural hematoma. 5 mm low-density LEFT parafalcine fluid collection. 2 mm LEFT-to-RIGHT midline shift, improved. No intraparenchymal hemorrhage. No parenchymal brain volume loss for age. Faint supratentorial white matter hypodensities compatible with chronic small vessel ischemic changes, less than expected for age. LEFT extra-axial pneumocephalus and Surgicel. Basal cisterns are patent. VASCULAR: Mild calcific atherosclerosis of the carotid siphons. SKULL: New LEFT frontal craniotomy. Postoperative LEFT scalp soft tissue swelling with skin staples and minimal subcutaneous gas. SINUSES/ORBITS: Mild paranasal sinus mucosal thickening. Nasogastric tube via RIGHT nares. Mastoid air cells are well aerated.The included ocular globes and orbital contents are non-suspicious. OTHER: None. CTA NECK FINDINGS: AORTIC ARCH: Normal appearance of the thoracic arch, normal branch pattern. Mild calcific atherosclerosis aortic arch. The origins of the innominate, left Common carotid artery and subclavian artery are patent. RIGHT CAROTID SYSTEM: Common carotid artery is patent. Normal appearance of the carotid bifurcation without hemodynamically significant stenosis by NASCET criteria. Normal appearance of the internal carotid artery. LEFT CAROTID SYSTEM: Common carotid artery is patent. Trace calcific atherosclerosis of the carotid bifurcation without hemodynamically significant stenosis by NASCET criteria. Fusiform dolichoectasia LEFT cervical internal carotid artery to 6 mm. VERTEBRAL ARTERIES:Nearly codominant vertebral arteries. Normal appearance of the vertebral arteries, widely patent.  SKELETON: No acute osseous process though bone windows  have not been submitted. Severe C3-4 through C6-7 spondylosis. Severe RIGHT C3-4, bilateral C4-5, LEFT C5-6 neural foraminal narrowing. Moderate canal stenosis C3-4, C4-5, C5-6. OTHER NECK: Soft tissues of the neck are nonacute though, not tailored for evaluation. UPPER CHEST: Moderate RIGHT and small LEFT pleural effusions. Mosaic attenuation suggesting pulmonary edema. Nasogastric tube in place. CTA HEAD FINDINGS: ANTERIOR CIRCULATION: Patent cervical internal carotid arteries, petrous, cavernous and supra clinoid internal carotid arteries. Patent anterior communicating artery. Patent anterior and middle cerebral arteries. Mild stenosis LEFT M 1 segment. No large vessel occlusion, flow-limiting stenosis, contrast extravasation or aneurysm. POSTERIOR CIRCULATION: Patent vertebral arteries, vertebrobasilar junction and basilar artery, as well as main branch vessels. Patent posterior cerebral arteries. No large vessel occlusion, flow-limiting stenosis, contrast extravasation or aneurysm. VENOUS SINUSES: Major dural venous sinuses are patent though not tailored for evaluation on this angiographic examination. ANATOMIC VARIANTS: None. DELAYED PHASE: No abnormal parenchymal enhancement. MIP images reviewed. IMPRESSION: CT HEAD: 1. Status post craniotomy for evacuation of subdural hematoma with 5 mm residual low-density component. 5 mm LEFT parafalcine hygroma and trace falcotentorial subdural hematoma. 2 mm residual LEFT-to-RIGHT midline shift. CTA NECK: 1. No hemodynamically significant stenosis ICA's. Patent vertebral arteries. 2. Dolichoectatic LEFT cervical ICA seen with atherosclerosis, hypertension or old intimal injury. 3. New moderate RIGHT small LEFT pleural effusions with findings of pulmonary edema. Recommend follow-up chest radiograph. 4. Moderate canal stenosis C3-4 through C5-6. Severe C3-4 through C5-6 neural foraminal narrowing. CTA HEAD: 1. No emergent large vessel occlusion or flow-limiting stenosis.  2. Mild stenosis LEFT M 1 segment. Aortic Atherosclerosis (ICD10-I70.0). Electronically Signed   By: Awilda Metro M.D.   On: 03/18/2018 13:27   Dg Chest 2 View  Result Date: 03/19/2018 CLINICAL DATA:  Pleural effusion EXAM: CHEST - 2 VIEW COMPARISON:  03/13/2018 FINDINGS: Feeding tube has been placed with its tip beyond the fundus of the stomach. Small bilateral pleural effusions of the velum with bibasilar atelectasis. No pneumothorax. Cardiomegaly. Normal vascularity. IMPRESSION: Small bilateral pleural effusions and bibasilar atelectasis. Electronically Signed   By: Jolaine Click M.D.   On: 03/19/2018 14:33   Ct Angio Neck W Or Wo Contrast  Result Date: 03/18/2018 CLINICAL DATA:  Follow up stroke. Status post subdural hematoma evacuation March 10, 2018. History of seizures and atrial fibrillation. EXAM: CT ANGIOGRAPHY HEAD AND NECK TECHNIQUE: Multidetector CT imaging of the head and neck was performed using the standard protocol during bolus administration of intravenous contrast. Multiplanar CT image reconstructions and MIPs were obtained to evaluate the vascular anatomy. Carotid stenosis measurements (when applicable) are obtained utilizing NASCET criteria, using the distal internal carotid diameter as the denominator. CONTRAST:  ISOVUE-370 IOPAMIDOL (ISOVUE-370) INJECTION 76% COMPARISON:  MRI/MRA head March 13, 2018 and CT HEAD March 10, 2018. FINDINGS: CT HEAD FINDINGS BRAIN: Status post evacuation of LEFT subdural hematoma with 6 mm residual low-density LEFT frontal component. Trace LEFT falcotentorial subdural hematoma. 5 mm low-density LEFT parafalcine fluid collection. 2 mm LEFT-to-RIGHT midline shift, improved. No intraparenchymal hemorrhage. No parenchymal brain volume loss for age. Faint supratentorial white matter hypodensities compatible with chronic small vessel ischemic changes, less than expected for age. LEFT extra-axial pneumocephalus and Surgicel. Basal cisterns are  patent. VASCULAR: Mild calcific atherosclerosis of the carotid siphons. SKULL: New LEFT frontal craniotomy. Postoperative LEFT scalp soft tissue swelling with skin staples and minimal subcutaneous gas. SINUSES/ORBITS: Mild paranasal sinus mucosal thickening. Nasogastric tube via RIGHT nares. Mastoid air cells are well aerated.The  included ocular globes and orbital contents are non-suspicious. OTHER: None. CTA NECK FINDINGS: AORTIC ARCH: Normal appearance of the thoracic arch, normal branch pattern. Mild calcific atherosclerosis aortic arch. The origins of the innominate, left Common carotid artery and subclavian artery are patent. RIGHT CAROTID SYSTEM: Common carotid artery is patent. Normal appearance of the carotid bifurcation without hemodynamically significant stenosis by NASCET criteria. Normal appearance of the internal carotid artery. LEFT CAROTID SYSTEM: Common carotid artery is patent. Trace calcific atherosclerosis of the carotid bifurcation without hemodynamically significant stenosis by NASCET criteria. Fusiform dolichoectasia LEFT cervical internal carotid artery to 6 mm. VERTEBRAL ARTERIES:Nearly codominant vertebral arteries. Normal appearance of the vertebral arteries, widely patent. SKELETON: No acute osseous process though bone windows have not been submitted. Severe C3-4 through C6-7 spondylosis. Severe RIGHT C3-4, bilateral C4-5, LEFT C5-6 neural foraminal narrowing. Moderate canal stenosis C3-4, C4-5, C5-6. OTHER NECK: Soft tissues of the neck are nonacute though, not tailored for evaluation. UPPER CHEST: Moderate RIGHT and small LEFT pleural effusions. Mosaic attenuation suggesting pulmonary edema. Nasogastric tube in place. CTA HEAD FINDINGS: ANTERIOR CIRCULATION: Patent cervical internal carotid arteries, petrous, cavernous and supra clinoid internal carotid arteries. Patent anterior communicating artery. Patent anterior and middle cerebral arteries. Mild stenosis LEFT M 1 segment. No large  vessel occlusion, flow-limiting stenosis, contrast extravasation or aneurysm. POSTERIOR CIRCULATION: Patent vertebral arteries, vertebrobasilar junction and basilar artery, as well as main branch vessels. Patent posterior cerebral arteries. No large vessel occlusion, flow-limiting stenosis, contrast extravasation or aneurysm. VENOUS SINUSES: Major dural venous sinuses are patent though not tailored for evaluation on this angiographic examination. ANATOMIC VARIANTS: None. DELAYED PHASE: No abnormal parenchymal enhancement. MIP images reviewed. IMPRESSION: CT HEAD: 1. Status post craniotomy for evacuation of subdural hematoma with 5 mm residual low-density component. 5 mm LEFT parafalcine hygroma and trace falcotentorial subdural hematoma. 2 mm residual LEFT-to-RIGHT midline shift. CTA NECK: 1. No hemodynamically significant stenosis ICA's. Patent vertebral arteries. 2. Dolichoectatic LEFT cervical ICA seen with atherosclerosis, hypertension or old intimal injury. 3. New moderate RIGHT small LEFT pleural effusions with findings of pulmonary edema. Recommend follow-up chest radiograph. 4. Moderate canal stenosis C3-4 through C5-6. Severe C3-4 through C5-6 neural foraminal narrowing. CTA HEAD: 1. No emergent large vessel occlusion or flow-limiting stenosis. 2. Mild stenosis LEFT M 1 segment. Aortic Atherosclerosis (ICD10-I70.0). Electronically Signed   By: Awilda Metro M.D.   On: 03/18/2018 13:27    Assessment/Plan: Postop day #11: The patient's follow-up imaging studies look good.  His aphasia is improving.  He has not had any recent seizures.  It looks like he is ready for rehab.  I will transfer him to progressive and to rehab when a bed is available.  LOS: 8 days     Cristi Loron 03/20/2018, 9:47 AM

## 2018-03-20 NOTE — H&P (Signed)
Physical Medicine and Rehabilitation Admission H&P    Chief Complaint  Patient presents with  . Aphasia    w/ RA wkness  : HPI: Alvin Hudson is an 83 year old right handed male with history of seizure 2 years ago maintained on Keppra followed by Dr. Rulon Eisenmenger at [facility]. History taken from chart review and wife.  Initially presented 03/10/2018 after a recent fall initial cranial CT scan negative. Patient lives with spouse. Independent prior to initial admission of 03/10/2018. Patient is very active and drives. Two level home with 2 steps to entry bedroom on main level. He developed progressive headaches. He was taken to [facility] or scans were obtained attempts initially made to transfer to [facility] but they could not accommodate him because the ICU was full. X-rays and imaging follow-up  at [facility] showed acute left subdural hematoma. Patient was transferred to [facility] and underwent left frontotemporal parietal craniotomy for evacuation of subdural hematoma 03/10/2018 per Dr. Tressie Stalker. Patient progressed nicely and initial therapy evaluation and ambulating 500 feet without assistive device and was discharged home 03/12/2018. Patient was readmitted same day after family reports decrease in mobility as well as speech difficulty and dysphagia. Follow-up CT/MRI reviewed, showing left frontal extra-axial hematoma.  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 No intracranial large vessel occlusion. 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 Was an incidental note of moderate right small left pleural effusions with findings of pulmonary  edema with follow-up chest x-ray showing small bilateral pleural effusions and bibasilar atelectasis no change in plan of care. Noted seizure on 03/15/2018.Follow-up EEGs negative for seizure. Currently maintained on Keppra, valproic acid as well as Vimpat for seizure prophylaxis.plans to currently continue diet epileptic regimen until follow-up as outpatient with neurology.  Patient new-onset A. Fib with RVR initially placed on Cardizem drip transition to Cardizem. Troponin mildly elevated 0.04-0.15 felt to be related to demand ischemia. Echocardiogram with ejection fraction of 65%. Normal systolic function. Patient is currently NPO with nasogastric tube in place for nutritional support with follow-up modified barium swallow completed 03/20/2018 placed on a dysphagia #2 thin liquid diet. Therapy evaluations completed with recommendations of physical medicine rehabilitation consult. Patient was admitted for a cooperative rehabilitation program. Please also see preadmission assessment from today.  Review of Systems  Unable to perform ROS: Mental acuity   Past Medical History:  Diagnosis Date  . Allergy   . Seizures (HCC)    Past Surgical History:  Procedure Laterality Date  . CRANIOTOMY Left 03/10/2018   Procedure: CRANIOTOMY HEMATOMA EVACUATION SUBDURAL;  Surgeon: Tressie Stalker, MD;  Location: [facility] OR;  Service: Neurosurgery;  Laterality: Left;  . INGUINAL HERNIA REPAIR Bilateral    No pertinent family history of SDH. Social History:  reports that he has never smoked. He has never used smokeless tobacco. He reports current alcohol use of about 14.0 standard drinks of alcohol per week. He reports that he does not use drugs. Allergies:  Allergies  Allergen Reactions  . Aspirin Other (See Comments)    Caused ulcers  . Naproxen Other (See Comments)    Caused ulcers   Medications Prior to Admission  Medication Sig Dispense  Refill  . atorvastatin (LIPITOR) 20 MG tablet Take 20 mg by mouth  daily.    . B Complex-C-Folic Acid (B COMPLEX-VITAMIN C-FOLIC ACID) 1 MG tablet Take 1 tablet by mouth daily.    Marland Kitchen docusate sodium (COLACE) 100 MG capsule Take 1 capsule (100 mg total) by mouth 2 (two) times daily. 60 capsule 0  . HYDROcodone-acetaminophen (NORCO/VICODIN) 5-325 MG tablet Take 1 tablet by mouth every 4 (four) hours as needed for moderate pain. 30 tablet 0  . levETIRAcetam (KEPPRA) 750 MG tablet Take 750 mg by mouth 2 (two) times daily.    . polycarbophil (FIBERCON) 625 MG tablet Take 625 mg by mouth daily.    . sertraline (ZOLOFT) 100 MG tablet Take 100 mg by mouth daily.    . temazepam (RESTORIL) 15 MG capsule Take 15 mg by mouth at bedtime.      Drug Regimen Review Drug regimen was reviewed and remains appropriate with no significant issues identified  Home: Home Living Family/patient expects to be discharged to:: Private residence Living Arrangements: Spouse/significant other Available Help at Discharge: Family, Available 24 hours/day Type of Home: House Home Access: Stairs to enter [employer] of Steps: 3 Entrance Stairs-Rails: None Home Layout: Two level, Able to live on main level with bedroom/bathroom Alternate Level Stairs-Number of Steps: flight Bathroom Shower/Tub: Associate Professor: Yes Home Equipment: None  Lives With: Spouse   Functional History: Prior Function Level of Independence: Independent Comments: no AD, drives; is an Tree surgeon adn enjoys reading  Functional Status:  Mobility: Bed Mobility Overal bed mobility: Needs Assistance Bed Mobility: Supine to Sit Supine to sit: HOB elevated, Mod assist General bed mobility comments: +rail, cues for sequencing Transfers Overall transfer level: Needs assistance Equipment used: None Transfers: Sit to/from Stand Sit to Stand: +2 safety/equipment, Mod assist General transfer comment: cues for sequencing, increased  time Ambulation/Gait Ambulation/Gait assistance: Mod assist, +2 safety/equipment Gait Distance (Feet): 45 Feet Assistive device: None Gait Pattern/deviations: Step-through pattern, Decreased stride length General Gait Details: unsteady gait requiring assist to maintain balance. Pt fatigues quickly  Gait velocity: decreased    ADL: ADL Overall ADL's : Needs assistance/impaired Eating/Feeding: NPO Grooming: Moderate assistance Upper Body Bathing: Moderate assistance, Sitting Lower Body Bathing: Maximal assistance, Sit to/from stand Upper Body Dressing : Maximal assistance, Sitting Lower Body Dressing: Maximal assistance, Sit to/from stand Lower Body Dressing Details (indicate cue type and reason): Able to donn R sock after set up; backward chaining used Toilet Transfer: Moderate assistance, +2 for physical assistance, Ambulation, Comfort height toilet Toileting- Clothing Manipulation and Hygiene: Maximal assistance, Sit to/from stand Functional mobility during ADLs: Moderate assistance, +2 for physical assistance General ADL Comments: Able to initiate tasks spontaneously; Difficulty completing tasks on command  Cognition: Cognition Overall Cognitive Status: Impaired/Different from baseline Arousal/Alertness: Awake/alert Orientation Level: Other (comment)(expressive aphasia) Attention: Sustained Sustained Attention: Impaired Sustained Attention Impairment: Verbal basic, Functional basic Memory: (TBA) Awareness: Impaired Awareness Impairment: Emergent impairment Problem Solving: Impaired Problem Solving Impairment: Functional basic Behaviors: Restless Safety/Judgment: Impaired [place] [id number] Scales of Cognitive Functioning: Confused/appropriate Cognition Arousal/Alertness: Awake/alert Behavior During Therapy: Flat affect Overall Cognitive Status: Impaired/Different from baseline Area of Impairment: Attention, Following commands, Safety/judgement, Awareness, Problem  solving Current Attention Level: Sustained Following Commands: Follows one step commands inconsistently, Follows one step commands with increased time Safety/Judgement: Decreased awareness of safety, Decreased awareness of deficits Awareness: Intellectual Problem Solving: Slow processing, Decreased initiation, Difficulty sequencing, Requires verbal cues, Requires tactile cues  Physical Exam: Blood pressure Marland Kitchen)  123/95, pulse 73, temperature 97.9 F (36.6 C), temperature source Oral, resp. rate 19, weight 65.4 kg, SpO2 98 %. Physical Exam  Constitutional: He appears well-developed and well-nourished.  HENT:  Head: Normocephalic and atraumatic.  Craniotomy site clean and dry with staples intact +NG  Eyes: EOM are normal. Right eye exhibits no discharge. Left eye exhibits no discharge.  Pupils reactive to light  Neck: Normal range of motion. Neck supple.  Cardiovascular:  Irregularly irregular  Respiratory: Effort normal and breath sounds normal. No respiratory distress.  GI: Soft. Bowel sounds are normal.  Musculoskeletal:     Comments: No edema or tenderness in extremities  Neurological: He is alert.  Global, expressive > receptive aphasia.  Motor: Limitted due to participation, moving b/l UE spontaneously, not moving b/l LE.    Skin:  See above  Psychiatric:  Unable to assess due to mentation    Results for orders placed or performed during the hospital encounter of 03/12/18 (from the past 48 hour(s))  Glucose, capillary     Status: Abnormal   Collection Time: 03/18/18 12:01 PM  Result Value Ref Range   Glucose-Capillary 110 (H) 70 - 99 mg/dL  Glucose, capillary     Status: Abnormal   Collection Time: 03/18/18  7:36 PM  Result Value Ref Range   Glucose-Capillary 108 (H) 70 - 99 mg/dL  Glucose, capillary     Status: Abnormal   Collection Time: 03/18/18 11:20 PM  Result Value Ref Range   Glucose-Capillary 118 (H) 70 - 99 mg/dL  Glucose, capillary     Status: Abnormal    Collection Time: 03/19/18  4:01 AM  Result Value Ref Range   Glucose-Capillary 129 (H) 70 - 99 mg/dL  Valproic acid level     Status: None   Collection Time: 03/19/18  5:44 AM  Result Value Ref Range   Valproic Acid Lvl 63 50.0 - 100.0 ug/mL    Comment: Performed at [facility], [address] [place], [place] [address]  CBC     Status: Abnormal   Collection Time: 03/19/18  5:44 AM  Result Value Ref Range   WBC 12.2 (H) 4.0 - 10.5 K/uL   RBC 3.58 (L) 4.22 - 5.81 MIL/uL   Hemoglobin 12.2 (L) 13.0 - 17.0 g/dL   HCT 60.4 (L) 54.0 - 98.1 %   MCV 106.4 (H) 80.0 - 100.0 fL   MCH 34.1 (H) 26.0 - 34.0 pg   MCHC 32.0 30.0 - 36.0 g/dL   RDW 19.1 47.8 - 29.5 %   Platelets 384 150 - 400 K/uL   nRBC 0.0 0.0 - 0.2 %    Comment: Performed at [facility], [address], [place], [place] [address]  Basic metabolic panel     Status: Abnormal   Collection Time: 03/19/18  5:44 AM  Result Value Ref Range   Sodium 136 135 - 145 mmol/L   Potassium 3.9 3.5 - 5.1 mmol/L   Chloride 97 (L) 98 - 111 mmol/L   CO2 27 22 - 32 mmol/L   Glucose, Bld 88 70 - 99 mg/dL   BUN 10 8 - 23 mg/dL   Creatinine, Ser 8.65 0.61 - 1.24 mg/dL   Calcium 9.0 8.9 - 78.4 mg/dL   GFR calc non Af Amer >60 >60 mL/min   GFR calc Af Amer >60 >60 mL/min   Anion gap 12 5 - 15    Comment: Performed at [facility], [address], [place],  [place] [address]  Glucose, capillary     Status: Abnormal   Collection Time: 03/19/18  8:13 AM  Result Value Ref Range   Glucose-Capillary 119 (H) 70 - 99 mg/dL  Glucose, capillary     Status: Abnormal   Collection Time: 03/19/18 11:45 AM  Result Value Ref Range   Glucose-Capillary 115 (H) 70 - 99 mg/dL  Glucose, capillary     Status: Abnormal   Collection Time: 03/19/18  4:57 PM  Result Value Ref Range   Glucose-Capillary 117 (H) 70 - 99 mg/dL  Glucose, capillary     Status: None   Collection Time: 03/19/18  8:07 PM  Result Value Ref Range   Glucose-Capillary 96 70  - 99 mg/dL  Glucose, capillary     Status: Abnormal   Collection Time: 03/19/18 10:54 PM  Result Value Ref Range   Glucose-Capillary 161 (H) 70 - 99 mg/dL  Glucose, capillary     Status: Abnormal   Collection Time: 03/20/18  3:36 AM  Result Value Ref Range   Glucose-Capillary 103 (H) 70 - 99 mg/dL  CBC     Status: Abnormal   Collection Time: 03/20/18  5:42 AM  Result Value Ref Range   WBC 9.9 4.0 - 10.5 K/uL   RBC 3.38 (L) 4.22 - 5.81 MIL/uL   Hemoglobin 11.8 (L) 13.0 - 17.0 g/dL   HCT 81.1 (L) 91.4 - 78.2 %   MCV 105.6 (H) 80.0 - 100.0 fL   MCH 34.9 (H) 26.0 - 34.0 pg   MCHC 33.1 30.0 - 36.0 g/dL   RDW 95.6 21.3 - 08.6 %   Platelets 371 150 - 400 K/uL   nRBC 0.0 0.0 - 0.2 %    Comment: Performed at [facility], [address], [place], [place] [address]  Basic metabolic panel     Status: Abnormal   Collection Time: 03/20/18  5:42 AM  Result Value Ref Range   Sodium 135 135 - 145 mmol/L   Potassium 4.1 3.5 - 5.1 mmol/L   Chloride 98 98 - 111 mmol/L   CO2 29 22 - 32 mmol/L   Glucose, Bld 114 (H) 70 - 99 mg/dL   BUN 10 8 - 23 mg/dL   Creatinine, Ser 9.62 0.61 - 1.24 mg/dL   Calcium 9.0 8.9 - 95.2 mg/dL   GFR calc non Af Amer >60 >60 mL/min   GFR calc Af Amer >60 >60 mL/min   Anion gap 8 5 - 15    Comment: Performed at [facility], [address] [place], [place] [address]  Glucose, capillary     Status: Abnormal   Collection Time: 03/20/18  7:38 AM  Result Value Ref Range   Glucose-Capillary 131 (H) 70 - 99 mg/dL   Comment 1 Notify RN    Comment 2 Document in Chart    Ct Angio Head W Or Wo Contrast  Result Date: 03/18/2018 CLINICAL DATA:  Follow up stroke. Status post subdural hematoma evacuation March 10, 2018. History of seizures and atrial fibrillation. EXAM: CT ANGIOGRAPHY HEAD AND NECK TECHNIQUE: Multidetector CT imaging of the head and neck was performed using the standard protocol during bolus administration of intravenous contrast. Multiplanar CT  image reconstructions and MIPs were obtained to evaluate the vascular anatomy. Carotid stenosis measurements (when applicable) are obtained utilizing NASCET criteria, using the distal internal carotid diameter as the denominator. CONTRAST:  ISOVUE-370 IOPAMIDOL (ISOVUE-370) INJECTION 76% COMPARISON:  MRI/MRA head March 13, 2018 and CT HEAD  March 10, 2018. FINDINGS: CT HEAD FINDINGS BRAIN: Status post evacuation of LEFT subdural hematoma with 6 mm residual low-density LEFT frontal component. Trace LEFT falcotentorial subdural hematoma. 5 mm low-density LEFT parafalcine fluid collection. 2 mm LEFT-to-RIGHT midline shift, improved. No intraparenchymal hemorrhage. No parenchymal brain volume loss for age. Faint supratentorial white matter hypodensities compatible with chronic small vessel ischemic changes, less than expected for age. LEFT extra-axial pneumocephalus and Surgicel. Basal cisterns are patent. VASCULAR: Mild calcific atherosclerosis of the carotid siphons. SKULL: New LEFT frontal craniotomy. Postoperative LEFT scalp soft tissue swelling with skin staples and minimal subcutaneous gas. SINUSES/ORBITS: Mild paranasal sinus mucosal thickening. Nasogastric tube via RIGHT nares. Mastoid air cells are well aerated.The included ocular globes and orbital contents are non-suspicious. OTHER: None. CTA NECK FINDINGS: AORTIC ARCH: Normal appearance of the thoracic arch, normal branch pattern. Mild calcific atherosclerosis aortic arch. The origins of the innominate, left Common carotid artery and subclavian artery are patent. RIGHT CAROTID SYSTEM: Common carotid artery is patent. Normal appearance of the carotid bifurcation without hemodynamically significant stenosis by NASCET criteria. Normal appearance of the internal carotid artery. LEFT CAROTID SYSTEM: Common carotid artery is patent. Trace calcific atherosclerosis of the carotid bifurcation without hemodynamically significant stenosis by NASCET criteria.  Fusiform dolichoectasia LEFT cervical internal carotid artery to 6 mm. VERTEBRAL ARTERIES:Nearly codominant vertebral arteries. Normal appearance of the vertebral arteries, widely patent. SKELETON: No acute osseous process though bone windows have not been submitted. Severe C3-4 through C6-7 spondylosis. Severe RIGHT C3-4, bilateral C4-5, LEFT C5-6 neural foraminal narrowing. Moderate canal stenosis C3-4, C4-5, C5-6. OTHER NECK: Soft tissues of the neck are nonacute though, not tailored for evaluation. UPPER CHEST: Moderate RIGHT and small LEFT pleural effusions. Mosaic attenuation suggesting pulmonary edema. Nasogastric tube in place. CTA HEAD FINDINGS: ANTERIOR CIRCULATION: Patent cervical internal carotid arteries, petrous, cavernous and supra clinoid internal carotid arteries. Patent anterior communicating artery. Patent anterior and middle cerebral arteries. Mild stenosis LEFT M 1 segment. No large vessel occlusion, flow-limiting stenosis, contrast extravasation or aneurysm. POSTERIOR CIRCULATION: Patent vertebral arteries, vertebrobasilar junction and basilar artery, as well as main branch vessels. Patent posterior cerebral arteries. No large vessel occlusion, flow-limiting stenosis, contrast extravasation or aneurysm. VENOUS SINUSES: Major dural venous sinuses are patent though not tailored for evaluation on this angiographic examination. ANATOMIC VARIANTS: None. DELAYED PHASE: No abnormal parenchymal enhancement. MIP images reviewed. IMPRESSION: CT HEAD: 1. Status post craniotomy for evacuation of subdural hematoma with 5 mm residual low-density component. 5 mm LEFT parafalcine hygroma and trace falcotentorial subdural hematoma. 2 mm residual LEFT-to-RIGHT midline shift. CTA NECK: 1. No hemodynamically significant stenosis ICA's. Patent vertebral arteries. 2. Dolichoectatic LEFT cervical ICA seen with atherosclerosis, hypertension or old intimal injury. 3. New moderate RIGHT small LEFT pleural effusions with  findings of pulmonary edema. Recommend follow-up chest radiograph. 4. Moderate canal stenosis C3-4 through C5-6. Severe C3-4 through C5-6 neural foraminal narrowing. CTA HEAD: 1. No emergent large vessel occlusion or flow-limiting stenosis. 2. Mild stenosis LEFT M 1 segment. Aortic Atherosclerosis (ICD10-I70.0). Electronically Signed   By: Awilda Metro M.D.   On: 03/18/2018 13:27   Dg Chest 2 View  Result Date: 03/19/2018 CLINICAL DATA:  Pleural effusion EXAM: CHEST - 2 VIEW COMPARISON:  03/13/2018 FINDINGS: Feeding tube has been placed with its tip beyond the fundus of the stomach. Small bilateral pleural effusions of the velum with bibasilar atelectasis. No pneumothorax. Cardiomegaly. Normal vascularity. IMPRESSION: Small bilateral pleural effusions and bibasilar atelectasis. Electronically Signed   By: Dahlia Client.D.  On: 03/19/2018 14:33   Ct Angio Neck W Or Wo Contrast  Result Date: 03/18/2018 CLINICAL DATA:  Follow up stroke. Status post subdural hematoma evacuation March 10, 2018. History of seizures and atrial fibrillation. EXAM: CT ANGIOGRAPHY HEAD AND NECK TECHNIQUE: Multidetector CT imaging of the head and neck was performed using the standard protocol during bolus administration of intravenous contrast. Multiplanar CT image reconstructions and MIPs were obtained to evaluate the vascular anatomy. Carotid stenosis measurements (when applicable) are obtained utilizing NASCET criteria, using the distal internal carotid diameter as the denominator. CONTRAST:  ISOVUE-370 IOPAMIDOL (ISOVUE-370) INJECTION 76% COMPARISON:  MRI/MRA head March 13, 2018 and CT HEAD March 10, 2018. FINDINGS: CT HEAD FINDINGS BRAIN: Status post evacuation of LEFT subdural hematoma with 6 mm residual low-density LEFT frontal component. Trace LEFT falcotentorial subdural hematoma. 5 mm low-density LEFT parafalcine fluid collection. 2 mm LEFT-to-RIGHT midline shift, improved. No intraparenchymal  hemorrhage. No parenchymal brain volume loss for age. Faint supratentorial white matter hypodensities compatible with chronic small vessel ischemic changes, less than expected for age. LEFT extra-axial pneumocephalus and Surgicel. Basal cisterns are patent. VASCULAR: Mild calcific atherosclerosis of the carotid siphons. SKULL: New LEFT frontal craniotomy. Postoperative LEFT scalp soft tissue swelling with skin staples and minimal subcutaneous gas. SINUSES/ORBITS: Mild paranasal sinus mucosal thickening. Nasogastric tube via RIGHT nares. Mastoid air cells are well aerated.The included ocular globes and orbital contents are non-suspicious. OTHER: None. CTA NECK FINDINGS: AORTIC ARCH: Normal appearance of the thoracic arch, normal branch pattern. Mild calcific atherosclerosis aortic arch. The origins of the innominate, left Common carotid artery and subclavian artery are patent. RIGHT CAROTID SYSTEM: Common carotid artery is patent. Normal appearance of the carotid bifurcation without hemodynamically significant stenosis by NASCET criteria. Normal appearance of the internal carotid artery. LEFT CAROTID SYSTEM: Common carotid artery is patent. Trace calcific atherosclerosis of the carotid bifurcation without hemodynamically significant stenosis by NASCET criteria. Fusiform dolichoectasia LEFT cervical internal carotid artery to 6 mm. VERTEBRAL ARTERIES:Nearly codominant vertebral arteries. Normal appearance of the vertebral arteries, widely patent. SKELETON: No acute osseous process though bone windows have not been submitted. Severe C3-4 through C6-7 spondylosis. Severe RIGHT C3-4, bilateral C4-5, LEFT C5-6 neural foraminal narrowing. Moderate canal stenosis C3-4, C4-5, C5-6. OTHER NECK: Soft tissues of the neck are nonacute though, not tailored for evaluation. UPPER CHEST: Moderate RIGHT and small LEFT pleural effusions. Mosaic attenuation suggesting pulmonary edema. Nasogastric tube in place. CTA HEAD FINDINGS:  ANTERIOR CIRCULATION: Patent cervical internal carotid arteries, petrous, cavernous and supra clinoid internal carotid arteries. Patent anterior communicating artery. Patent anterior and middle cerebral arteries. Mild stenosis LEFT M 1 segment. No large vessel occlusion, flow-limiting stenosis, contrast extravasation or aneurysm. POSTERIOR CIRCULATION: Patent vertebral arteries, vertebrobasilar junction and basilar artery, as well as main branch vessels. Patent posterior cerebral arteries. No large vessel occlusion, flow-limiting stenosis, contrast extravasation or aneurysm. VENOUS SINUSES: Major dural venous sinuses are patent though not tailored for evaluation on this angiographic examination. ANATOMIC VARIANTS: None. DELAYED PHASE: No abnormal parenchymal enhancement. MIP images reviewed. IMPRESSION: CT HEAD: 1. Status post craniotomy for evacuation of subdural hematoma with 5 mm residual low-density component. 5 mm LEFT parafalcine hygroma and trace falcotentorial subdural hematoma. 2 mm residual LEFT-to-RIGHT midline shift. CTA NECK: 1. No hemodynamically significant stenosis ICA's. Patent vertebral arteries. 2. Dolichoectatic LEFT cervical ICA seen with atherosclerosis, hypertension or old intimal injury. 3. New moderate RIGHT small LEFT pleural effusions with findings of pulmonary edema. Recommend follow-up chest radiograph. 4. Moderate canal stenosis C3-4 through  C5-6. Severe C3-4 through C5-6 neural foraminal narrowing. CTA HEAD: 1. No emergent large vessel occlusion or flow-limiting stenosis. 2. Mild stenosis LEFT M 1 segment. Aortic Atherosclerosis (ICD10-I70.0). Electronically Signed   By: Awilda Metro M.D.   On: 03/18/2018 13:27       Medical Problem List and Plan: 1.  Mild right hemiparesis with aphasia/dysphasia secondary to traumatic left extraaxial hematoma. Status post left frontotemporal parietal craniotomy evacuation of subdural hematoma 03/10/2018 2.   Antithrombotics: -DVT/anticoagulation:  SCDs. No anticoagulation due to subdural hematoma  -antiplatelet therapy: Not applicable 3. Pain Management:  Hydrocodone as needed 4. Mood:  Zoloft 100 mg daily, Restoril 15 mg daily at bedtime  -antipsychotic agents: not applicable 5. Neuropsych: This patient is not capable of making decisions on his own behalf. 6. Skin/Wound Care:  Routine skin checks 7. Fluids/Electrolytes/Nutrition:  Routine in and out's with follow-up chemistries 8. Seizure disorder. Keppra 1500 mg twice a day, Vimpat 200 mg twice a day, valproic acid 500 mg every 8 hours. Plan to continue this regimen to follow up outpatient with neurology services. 9. New-onset atrial fibrillation with RVR. Cardizem initiated 60 mg every 6 hours 03/16/2018. Cardiac rate control. No anticoagulation due to subdural hematoma. Follow-up cardiology services Dr. Garnette Scheuermann 10. Dysphagia. Dysphagia #2 thin liquids.Follow-up modified barium swallow  Post Admission Physician Evaluation: 1. Preadmission assessment reviewed and changes made below. 2. Functional deficits secondary  to left extra axial hematoma. 3. Patient is admitted to receive collaborative, interdisciplinary care between the physiatrist, rehab nursing staff, and therapy team. 4. Patient's level of medical complexity and substantial therapy needs in context of that medical necessity cannot be provided at a lesser intensity of care such as a SNF. 5. Patient has experienced substantial functional loss from his/her baseline which was documented above under the "Functional History" and "Functional Status" headings.  Judging by the patient's diagnosis, physical exam, and functional history, the patient has potential for functional progress which will result in measurable gains while on inpatient rehab.  These gains will be of substantial and practical use upon discharge  in facilitating mobility and self-care at the household level. 6. Physiatrist  will provide 24 hour management of medical needs as well as oversight of the therapy plan/treatment and provide guidance as appropriate regarding the interaction of the two. 7. 24 hour rehab nursing will assist with bladder management, safety, skin/wound care, disease management, medication administration and patient education  and help integrate therapy concepts, techniques,education, etc. 8. PT will assess and treat for/with: Lower extremity strength, range of motion, stamina, balance, functional mobility, safety, adaptive techniques and equipment, wound care, coping skills, pain control, education. Goals are: Supervision. 9. OT will assess and treat for/with: ADL's, functional mobility, safety, upper extremity strength, adaptive techniques and equipment, wound mgt, ego support, and community reintegration.   Goals are: Supervision. Therapy may not proceed with showering this patient. 10. SLP will assess and treat for/with: speech, language, cognition.  Goals are: Supervision/min A.. 11. Case Management and Social Worker will assess and treat for psychological issues and discharge planning. 12. Team conference will be held weekly to assess progress toward goals and to determine barriers to discharge. 13. Patient will receive at least 3 hours of therapy per day at least 5 days per week. 14. ELOS: 12-16 days.       15. Prognosis:  good  I have personally performed a face to face diagnostic evaluation, including, but not limited to relevant history and physical exam findings, of this patient  and developed relevant assessment and plan.  Additionally, I have reviewed and concur with the physician assistant's documentation above.  Maryla Morrow, MD, ABPMR Mcarthur Rossetti Angiulli, PA-C 03/20/2018  Maryla Morrow, MD, ABPMR

## 2018-03-20 NOTE — Discharge Summary (Signed)
Physician Discharge Summary  Patient ID: Alvin Hudson MRN: [id number] DOB/AGE: 1935/12/03 83 y.o.  Admit date: 03/12/2018 Discharge date: 03/20/2018  Admission Diagnoses: Acute left subdural hematoma, seizure disorder  Discharge Diagnoses: The same Active Problems:   Aphasia   Seizure [facility])   Atrial fibrillation [facility])   Discharged Condition: fair  Hospital Course: The patient was admitted on 03/10/2018 with aphasia and acute left subdural hematoma.  I performed a left craniotomy on that date.  His speech difficulties initially worsened.  His postoperative scan looked good.  The patient continued to improve and was doing great on 03/13/2018 and requested discharge to home.  He was discharged but then readmitted on 03/12/2018 with left dense hemiparesis/hemiplegia and aphasia.  Follow-up scans continued to look good.  He was further worked up with brain MRIs and CTAs which demonstrated mild left MCA stenosis.  Neurology saw the patient and he was worked up further with EEGs.  The initial EEGs were negative but he subsequently had witnessed seizures.  He continued Keppra and was started on Depakote and Vimpat.  His seizures resolved.  The patient also had new onset A. fib and elevated cardiac enzymes.  He was seen by cardiology, Dr. Garnette Scheuermann.  His A. fib was rate controlled with medications.  PT, OT, speech therapy and rehab has seen the patient.  On 03/20/2018 the patient's neurologic status continue to improve.  He was discharged to the inpatient rehab unit.  Consults: Neurology, cardiology, PT, OT, speech therapy, rehab Significant Diagnostic Studies: Head CTs, brain MRI, head CT angiogram, echocardiogram, EEG Treatments: As above Discharge Exam: Blood pressure (!) 123/95, pulse 73, temperature 97.9 F (36.6 C), temperature source Oral, resp. rate 19, weight 65.4 kg, SpO2 98 %. The patient is alert and pleasant.  Answer simple questions and gives names.  He is moving all 4 extremities.  His  craniotomy wound is healing well.  Disposition: Inpatient rehab  Discharge Instructions    Ambulatory referral to Neurology   Complete by:  As directed    An appointment is requested in approximately: 2 weeks   Call MD for:  difficulty breathing, headache or visual disturbances   Complete by:  As directed    Call MD for:  extreme fatigue   Complete by:  As directed    Call MD for:  hives   Complete by:  As directed    Call MD for:  persistant dizziness or light-headedness   Complete by:  As directed    Call MD for:  persistant nausea and vomiting   Complete by:  As directed    Call MD for:  redness, tenderness, or signs of infection (pain, swelling, redness, odor or green/yellow discharge around incision site)   Complete by:  As directed    Call MD for:  severe uncontrolled pain   Complete by:  As directed    Call MD for:  temperature >100.4   Complete by:  As directed    Diet - low sodium heart healthy   Complete by:  As directed    Discharge instructions   Complete by:  As directed    Call [phone] for a followup appointment. Take a stool softener while you are using pain medications.   Driving Restrictions   Complete by:  As directed    Do not drive .   Increase activity slowly   Complete by:  As directed    Lifting restrictions   Complete by:  As directed    Do not  lift more than 5 pounds. No excessive bending or twisting.   May shower / Bathe   Complete by:  As directed    Remove the dressing for 3 days after surgery.  You may shower, but leave the incision alone.   No dressing needed   Complete by:  As directed      Allergies as of 03/20/2018      Reactions   Aspirin Other (See Comments)   Caused ulcers   Naproxen Other (See Comments)   Caused ulcers      Medication List    STOP taking these medications   levETIRAcetam 750 MG tablet Commonly known as:  KEPPRA Replaced by:  levETIRAcetam 100 MG/ML solution   temazepam 15 MG capsule Commonly known  as:  RESTORIL     TAKE these medications   atorvastatin 20 MG tablet Commonly known as:  LIPITOR Take 20 mg by mouth daily.   B complex-vitamin C-folic acid 1 MG tablet Take 1 tablet by mouth daily.   diltiazem 60 MG tablet Commonly known as:  CARDIZEM 1 tablet (60 mg total) by Per NG tube route every 6 (six) hours.   docusate sodium 100 MG capsule Commonly known as:  COLACE Take 1 capsule (100 mg total) by mouth 2 (two) times daily.   FIBERCON 625 MG tablet Generic drug:  polycarbophil Take 625 mg by mouth daily.   HYDROcodone-acetaminophen 5-325 MG tablet Commonly known as:  NORCO/VICODIN Take 1 tablet by mouth every 4 (four) hours as needed for moderate pain.   lacosamide 200 MG Tabs tablet Commonly known as:  VIMPAT Take 1 tablet (200 mg total) by mouth 2 (two) times daily.   levETIRAcetam 100 MG/ML solution Commonly known as:  KEPPRA Take 15 mLs (1,500 mg total) by mouth 2 (two) times daily. Replaces:  levETIRAcetam 750 MG tablet   sertraline 100 MG tablet Commonly known as:  ZOLOFT Take 100 mg by mouth daily.   valproic acid 250 MG/5ML Soln solution Commonly known as:  DEPAKENE Place 10 mLs (500 mg total) into feeding tube every 8 (eight) hours.      Follow-up Information    Rosalio Macadamia, NP. Go on 04/24/2018.   Specialties:  Nurse Practitioner, Interventional Cardiology, Cardiology, Radiology Why:  @1 :30 for hospital follow up with Dr. Michaelle Copas PA/NA Contact information: [address] [place] [address] [phone]           Signed: Cristi Loron 03/20/2018, 10:10 AM

## 2018-03-20 NOTE — Progress Notes (Signed)
Alvin Hudson is a 83 y.o. male patient who transferred  from 4N17 awake, alert  & orientated  to self, Full Code, VSS - Blood pressure 121/88, pulse 88, temperature 99.7 F (37.6 C), temperature source Oral, resp. rate 16, height 5\' 11"  (1.803 m), weight 64.7 kg, SpO2 99 %. No c/o shortness of breath, no c/o chest pain, no distress noted.    IV site WDL: Right upper left and right upper forearm with a transparent dsg that's clean dry and intact.  Allergies:   Allergies  Allergen Reactions  . Aspirin Other (See Comments)    Caused ulcers  . Naproxen Other (See Comments)    Caused ulcers     Past Medical History:  Diagnosis Date  . Allergy   . Seizures (HCC)     Pt orientation to unit, room and routine. SR up x 2, fall risk assessment complete with Patient and family verbalizing understanding of risks associated with falls. Pt verbalizes an understanding of how to use the call bell and to call for help before getting out of bed.  Skin, clean-dry- intact without evidence of bruising, or skin tears.   No evidence of skin break down noted on exam.     Will cont to monitor and assist as needed.  Andris Baumann, RN 03/20/2018 4:19 PM

## 2018-03-20 NOTE — Progress Notes (Signed)
Jamse Arn, MD  Physician  Physical Medicine and Rehabilitation  PMR Pre-admission  Addendum  Date of Service:  03/20/2018 11:46 AM       Related encounter: ED to Hosp-Admission (Discharged) from 03/12/2018 in [place] NEURO/TRAUMA/SURGICAL ICU         Show:Clear all [x]Manual[x]Template[x]Copied  Added by: [x], Vertis Kelch, RN[x]Patel, Domenick Bookbinder, MD  []Hover for details  PMR Admission Coordinator Pre-Admission Assessment  Patient: Alvin Hudson is an 83 y.o., male MRN: [id number] DOB: 09/15/1935 Height:   Weight: 65.4 kg  Insurance Information HMO:     PPO:      PCP:      IPA:      80/20:      OTHER: no HMO PRIMARY: Medicare a and   b   Policy#: [id number]      Subscriber: pt Benefits:  Phone #: passport one online     Name: 03/20/2018 Eff. Date: a 05/26/2002 b 07/26/2003     Deduct: $1408      Out of Pocket Max: none      Life Max: none CIR: 100%      SNF: 20 full days Outpatient: 80%     Co-Pay: 20% Home Health: 100%      Co-Pay: none DME: 80%     Co-Pay: 20% Providers: pt choice  SECONDARY: AARP supplement      Policy#: [id number]      Subscriber: pt   Medicaid Application Date:       Case Manager:  Disability Application Date:       Case Worker:   Emergency Contact Information         Contact Information    Name Relation Home Work Mobile   Dutton,Shelby Spouse   [phone]   Maren Reamer Daughter   [phone]      Current Medical History  Patient Admitting Diagnosis: Left Extra-axial hematoma  History of Present Illness: Alvin Hudson is an 83 year old right handed male with history of seizure 2 years ago maintained on Keppra followed by Dr. Philippa Chester at [facility]. Initially presented 03/10/2018 after a recent fall initial cranial CT scan negative.  He developed progressive headaches. He was taken to [facility] or scans were obtained attempts initially made to transfer to [place] at [facility] but they could not  accommodate him because the ICU for full. X-rays and imaging follow-up at [facility] showed acute left subdural hematoma. Patient was transferred to [facility] and underwent left frontotemporal parietal craniotomy for evacuation of subdural hematoma 03/10/2018 per Dr. Newman Pies. Patient progressed nicely and initial therapy evaluation and ambulating 500 feet without assistive device and was discharged home 03/12/2018. Patient was readmitted same day after family reports decrease in mobility as well as speech difficulty and dysphagia. 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 No intracranial large vessel occlusion. 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 Was an incidental note of moderate right small left pleural effusions with findings of pulmonary edema with follow-up chest x-ray showing small bilateral pleural effusions and bibasilar atelectasis no change in plan of care. Noted seizure on 03/15/2018.Follow-up EEGs negative for seizure. Currently maintained on Keppra, valproic acid as well as Vimpat  for seizure prophylaxis.plans to currently continue diet epileptic regimen until follow-up as outpatient with neurology. Patient new-onset A. Fib RVR initially placed on Cardizem drip transition to Cardizem. Troponin mildly elevated 0.04-0.15 felt to be related to demand ischemia. Echocardiogram with ejection fraction of 65%. Normal systolic function. Patient is currently NPO with nasogastric tube in place for nutritional support with follow-up modified barium swallowcompleted 03/20/2018 placed on a dysphagia #2 thin liquid diet.   Patient's medical record from  [facility] has been reviewed by the rehabilitation admission coordinator and physician.  Past Medical History      Past Medical History:  Diagnosis Date  . Allergy   . Seizures ([place])     Family History   family history is not on file.  Prior Rehab/Hospitalizations Has the patient had major surgery during 100 days prior to admission? No               Current Medications  Current Facility-Administered Medications:  .  0.9 %  sodium chloride infusion, , Intravenous, Continuous, Rosalin Hawking, MD, Last Rate: 50 mL/hr at 03/20/18 0800 .  acetaminophen (TYLENOL) tablet 650 mg, 650 mg, Oral, Q4H PRN **OR** acetaminophen (TYLENOL) suppository 650 mg, 650 mg, Rectal, Q4H PRN, Judith Part, MD, 650 mg at 03/14/18 1232 .  atorvastatin (LIPITOR) tablet 20 mg, 20 mg, Oral, Daily, Ostergard, Thomas A, MD, 20 mg at 03/20/18 1144 .  B-complex with vitamin C tablet 1 tablet, 1 tablet, Oral, Daily, Newman Pies, MD, 1 tablet at 03/19/18 1059 .  chlorhexidine (PERIDEX) 0.12 % solution 15 mL, 15 mL, Mouth Rinse, BID, Meyran, Ocie Cornfield, NP, 15 mL at 03/19/18 2140 .  diltiazem (CARDIZEM) tablet 60 mg, 60 mg, Per NG tube, Q6H, Belva Crome, MD, 60 mg at 03/20/18 0537 .  docusate sodium (COLACE) capsule 100 mg, 100 mg, Oral, BID, Ostergard, Joyice Faster, MD, 100 mg at 03/20/18 1146 .  feeding supplement (OSMOLITE 1.2 CAL) liquid 1,000 mL, 1,000 mL, Per Tube, Continuous, Rosalin Hawking, MD, Last Rate: 60 mL/hr at 03/19/18 2056, 1,000 mL at 03/19/18 2056 .  feeding supplement (PRO-STAT SUGAR FREE 64) liquid 30 mL, 30 mL, Per Tube, Daily, Rosalin Hawking, MD, 30 mL at 03/19/18 1101 .  haloperidol lactate (HALDOL) injection 2 mg, 2 mg, Intravenous, Q6H PRN, Rosalin Hawking, MD, 2 mg at 03/13/18 1517 .  HYDROcodone-acetaminophen (NORCO/VICODIN) 5-325 MG per tablet 1 tablet, 1 tablet, Oral, Q4H PRN, Ostergard, Thomas A, MD .  labetalol (NORMODYNE,TRANDATE) injection 10-40 mg, 10-40 mg,  Intravenous, Q10 min PRN, Judith Part, MD .  lacosamide (VIMPAT) tablet 200 mg, 200 mg, Oral, BID, Rosalin Hawking, MD, 200 mg at 03/20/18 1146 .  levETIRAcetam (KEPPRA) 100 MG/ML solution 1,500 mg, 1,500 mg, Oral, BID, Newman Pies, MD, 1,500 mg at 03/19/18 2143 .  MEDLINE mouth rinse, 15 mL, Mouth Rinse, q12n4p, Meyran, Ocie Cornfield, NP, 15 mL at 03/19/18 1614 .  ondansetron (ZOFRAN) tablet 4 mg, 4 mg, Oral, Q4H PRN **OR** ondansetron (ZOFRAN) injection 4 mg, 4 mg, Intravenous, Q4H PRN, Ostergard, Joyice Faster, MD .  polycarbophil (FIBERCON) tablet 625 mg, 625 mg, Oral, Daily, Ostergard, Thomas A, MD, 625 mg at 03/20/18 1145 .  sertraline (ZOLOFT) tablet 100 mg, 100 mg, Oral, Daily, Ostergard, Thomas A, MD, 100 mg at 03/20/18 1144 .  temazepam (RESTORIL) capsule 15 mg, 15 mg, Oral, QHS, Ostergard, Joyice Faster, MD, 15 mg at 03/19/18 2142 .  valproic acid (DEPAKENE) solution 500 mg, 500 mg, Per Tube, Q8H, Arnoldo Morale,  Dellis Filbert, MD, 500 mg at 03/20/18 9741  Patients Current Diet:      Diet Order                  DIET DYS 2 Room service appropriate? 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 Self Care: Did the patient need help bathing, dressing, using the toilet or eating?  Independent  Indoor Mobility: Did the patient need assistance with walking from room to room (with or without device)? Independent  Stairs: Did the  patient need assistance with internal or external stairs (with or without device)? Independent  Functional Cognition: Did the patient need help planning regular tasks such as shopping or remembering to take medications? Independent  Home Assistive Devices / Equipment Home Equipment: None  Prior Device Use: Indicate devices/aids used by the patient prior to current illness, exacerbation or injury? 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   Prior Functional Level Current Functional Level  Bed Mobility  Independent Mod assist with HOB elevated  Transfers  independent Mod assist  Mobility - Walk/Wheelchair independent   Mobility - Ambulation/Gait  independent Mod assist, +2 safety/equipment 45 feet with no assist device  Upper Body Dressing  independent Maximal assistance, Sitting  Lower Body Dressing independent Maximal assistance, Sit to/from stand  Grooming independent Moderate assistance  Eating/Drinking independent NPO with cortrak  Toilet Transfer independent Moderate assistance, +2 for physical assistance, Ambulation, Comfort height toilet  Bladder Continence continent   external catheter  Bowel Management  continent   incontinent  Stair Climbing independent   not attempted  Communication intact Expressive difficulties, HOH  Memory intact  impaired  Cooking/Meal Prep  independent     Housework  Pt does most of housework for wife has back issues   Money Management  independent   Driving  yes     Special needs/care consideration BiPAP/CPAP n/a CPM n/a Continuous Drip IV n/a Dialysis n/a Life Vest n/a Oxygen n/a Special Bed seizure precautions Trach Size n/a Wound Vac n/a Skin head surgical wound with staples Bowel mgmt: incontinent LBM 2/23 Bladder mgmt: external catheter Diabetic mgmt n/a Seizure precautions Cortrak bridled 03/15/2018  Previous Home Environment Living Arrangements:  Spouse/significant other  Lives With: Spouse Available Help at Discharge: Family, Available 24 hours/day Type of Home: House Home Layout: Two level, Able to live on main level with bedroom/bathroom Alternate Level Stairs-Number of Steps: flight Home Access: Stairs to enter Entrance Stairs-Rails: None Entrance Stairs-Number of Steps: 3 Bathroom Shower/Tub: Chiropodist: Standard Bathroom Accessibility: Yes How Accessible: Accessible via walker Home Care Services: No  Discharge Living Setting Plans for Discharge Living Setting: Patient's home, Lives with (comment)(wife) Type of Home at Discharge: House Discharge Home Layout: Two level, Able to live on main level with bedroom/bathroom Alternate Level Stairs-Number of Steps: flight Discharge Home Access: Stairs to enter Entrance  Stairs-Rails: None Entrance Stairs-Number of Steps: 3 Discharge Bathroom Shower/Tub: Tub/shower unit Discharge Bathroom Toilet: Standard Discharge Bathroom Accessibility: Yes How Accessible: Accessible via walker Does the patient have any problems obtaining your medications?: No  Social/Family/Support Systems Patient Roles: Spouse, Parent Contact Information: wife, Wilburn Cornelia Anticipated Caregiver: wife and daughter Anticipated Caregiver's Contact Information: [phone] Ability/Limitations of Caregiver: wife has a bad back Caregiver Availability: 24/7 Discharge Plan Discussed with Primary Caregiver: Yes Is Caregiver In Agreement with Plan?: Yes Does Caregiver/Family have Issues with Lodging/Transportation while Pt is in Rehab?: No  Goals/Additional Needs Patient/Family Goal for Rehab: supervision to min asisst with PT, OT, and SLP Expected length of stay: ELOS 14 to 20 days Dietary Needs: cortrak Pt/Family Agrees to Admission and willing to participate: Yes Program Orientation Provided & Reviewed with Pt/Caregiver Including Roles  & Responsibilities: Yes  Patient Condition: I  have reviewed medical records from [facility] , spoken with patient and wife at bedside. I met with patient at the bedside for inpatient rehabilitation assessment.  Patient will benefit from ongoing PT, OT, and SLP, can actively participate in 3 hours of therapy a day 5 days of the week, and can make measurable gains during the admission.  Patient will also benefit from the coordinated team approach during an Inpatient Acute Rehabilitation admission.  The patient will receive intensive therapy as well as Rehabilitation physician, nursing, social worker, and care management interventions.  Due to bowel management, bladder management, safety, skin/wound care, disease management, medical administration, pain management, patient education the patient requires 24 hour a day rehabilitation nursing.  The patient is currently mod assist with mobility and basic ADLs.  Discharge setting and therapy post discharge at  home with home health is anticipated.  Patient/wife has agreed to participate in the Acute Inpatient Rehabilitation Program and will admit today.  Preadmission Screen Completed By:  Cleatrice Burke RN MSN, 03/20/2018 11:47 AM ______________________________________________________________________   Discussed status with Dr. Posey Pronto  on  03/20/2018  at  67 and received telephone approval for admission today.  Admission Coordinator:  Cleatrice Burke RN MSN, time  1200 Date 03/20/2018   Assessment/Plan: Diagnosis: Left extra-axial hematoma Images reviewed (left frontal SDH) and labs reviewed.  1. Does the need for close, 24 hr/day  Medical supervision in concert with the patient's rehab needs make it unreasonable for this patient to be served in a less intensive setting? Yes  2. Co-Morbidities requiring supervision/potential complications:  Seizure, allergy, a. Fib, acute blood loss anemia 3. Due to bladder management, bowel management, safety, skin/wound care, disease  management, medication administration, pain management and patient education, does the patient require 24 hr/day rehab nursing? Yes 4. Does the patient require coordinated care of a physician, rehab nurse, PT (1-2 hrs/day, 5 days/week), OT (1-2 hrs/day, 5 days/week) and SLP (1-2 hrs/day, 5 days/week) to address physical and functional deficits in the context of the above medical diagnosis(es)? Yes Addressing deficits in the following areas: balance, endurance, locomotion, strength, transferring, bathing, dressing, toileting, cognition, speech, language, swallowing and psychosocial support 5. Can the patient actively participate in an intensive therapy program of at least 3 hrs of therapy 5 days a week? Yes 6. The potential for patient to make measurable gains while on inpatient rehab is excellent 7. Anticipated functional outcomes upon discharge from inpatients are: supervision and min assist PT, supervision and min assist OT, supervision and min assist SLP 8. Estimated rehab length of stay to reach the above functional goals is: 17-20 days. 9. Anticipated  D/C setting: Home 10. Anticipated post D/C treatments: HH therapy and Home excercise program 11. Overall Rehab/Functional Prognosis: good  Cleatrice Burke RN MSN Admissions coordinator 03/20/2018  Delice Lesch, MD, ABPMR    Revision History

## 2018-03-20 NOTE — Progress Notes (Signed)
  Speech Language Pathology Treatment: Dysphagia;Cognitive-Linquistic  Patient Details Name: Alvin Hudson MRN: [id number] DOB: 21-Jun-1935 Today's Date: 03/20/2018 Time: 1655-3748 SLP Time Calculation (min) (ACUTE ONLY): 15 min  Assessment / Plan / Recommendation Clinical Impression  He is alert, smiled and followed commands 50% this session. Oral care followed by puree trials with timely oral transit and pharyngeal swallow and pt aware of po's, maintains alertness indicating appropriateness for instrumental assessment. MBS schedule for 10:00 this morning. Required tactile cues to manipulate utensil, applesauce container during feeding.  Expressive abilities improved over weekend with pt repeating his first/last name, wife's name and completed phrase, your daughter's name is "Becky" independently. No oral or phonatory response during familiar song except for smiling when told therapist pt he was going to sing :-)  He continues to require max assist for comprehension and expression although progress being made in these areas as well as sustaining attention during tasks.    HPI HPI: The patient is an 83 year old white male who fell, head CT negative in ED and did not seen to be admitted. On 2/14 pt with speech difficulty and decreased movement on right side. CT revealed large acute left subdural hematoma and pt underwent craniotomy discharged 2/14. CXR Cardiomegaly. No acute cardiopulmonary abnormality.      SLP Plan  MBS       Recommendations  Diet recommendations: NPO Medication Administration: Via alternative means                Oral Care Recommendations: Oral care QID Follow up Recommendations: Inpatient Rehab SLP Visit Diagnosis: Cognitive communication deficit (R41.841);Dysphagia, unspecified (R13.10) Plan: MBS                      [name] 03/20/2018, 9:25 AM  [name] [name] Nurse, children's [phone] Office  [phone]

## 2018-03-20 NOTE — H&P (Addendum)
Physical Medicine and Rehabilitation Admission H&P    Chief Complaint  Patient presents with  . Aphasia    w/ RA wkness  : HPI: Alvin Hudson is an 83 year old right handed male with history of seizure 2 years ago maintained on Keppra followed by Dr. Rulon Eisenmenger at [facility]. History taken from chart review and wife.  Initially presented 03/10/2018 after a recent fall initial cranial CT scan negative. Patient lives with spouse. Independent prior to initial admission of 03/10/2018. Patient is very active and drives. Two level home with 2 steps to entry bedroom on main level. He developed progressive headaches. He was taken to [facility] or scans were obtained attempts initially made to transfer to [facility] but they could not accommodate him because the ICU was full. X-rays and imaging follow-up  at [facility] showed acute left subdural hematoma. Patient was transferred to [facility] and underwent left frontotemporal parietal craniotomy for evacuation of subdural hematoma 03/10/2018 per Dr. Tressie Stalker. Patient progressed nicely and initial therapy evaluation and ambulating 500 feet without assistive device and was discharged home 03/12/2018. Patient was readmitted same day after family reports decrease in mobility as well as speech difficulty and dysphagia. Follow-up CT/MRI reviewed, showing left frontal extra-axial hematoma.  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 No intracranial large vessel occlusion. 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 Was an incidental note of moderate right small left pleural effusions with findings of pulmonary  edema with follow-up chest x-ray showing small bilateral pleural effusions and bibasilar atelectasis no change in plan of care. Noted seizure on 03/15/2018.Follow-up EEGs negative for seizure. Currently maintained on Keppra, valproic acid as well as Vimpat for seizure prophylaxis.plans to currently continue diet epileptic regimen until follow-up as outpatient with neurology.  Patient new-onset A. Fib with RVR initially placed on Cardizem drip transition to Cardizem. Troponin mildly elevated 0.04-0.15 felt to be related to demand ischemia. Echocardiogram with ejection fraction of 65%. Normal systolic function. Patient is currently NPO with nasogastric tube in place for nutritional support with follow-up modified barium swallow completed 03/20/2018 placed on a dysphagia #2 thin liquid diet. Therapy evaluations completed with recommendations of physical medicine rehabilitation consult. Patient was admitted for a cooperative rehabilitation program. Please also see preadmission assessment from today.  Review of Systems  Unable to perform ROS: Mental acuity   Past Medical History:  Diagnosis Date  . Allergy   . Seizures (HCC)    Past Surgical History:  Procedure Laterality Date  . CRANIOTOMY Left 03/10/2018   Procedure: CRANIOTOMY HEMATOMA EVACUATION SUBDURAL;  Surgeon: Tressie Stalker, MD;  Location: [facility] OR;  Service: Neurosurgery;  Laterality: Left;  . INGUINAL HERNIA REPAIR Bilateral    No pertinent family history of SDH. Social History:  reports that he has never smoked. He has never used smokeless tobacco. He reports current alcohol use of about 14.0 standard drinks of alcohol per week. He reports that he does not use drugs. Allergies:  Allergies  Allergen Reactions  . Aspirin Other (See Comments)    Caused ulcers  . Naproxen Other (See Comments)    Caused ulcers   Medications Prior to Admission  Medication Sig Dispense  Refill  . atorvastatin (LIPITOR) 20 MG tablet Take 20 mg by mouth  daily.    . B Complex-C-Folic Acid (B COMPLEX-VITAMIN C-FOLIC ACID) 1 MG tablet Take 1 tablet by mouth daily.    Marland Kitchen docusate sodium (COLACE) 100 MG capsule Take 1 capsule (100 mg total) by mouth 2 (two) times daily. 60 capsule 0  . HYDROcodone-acetaminophen (NORCO/VICODIN) 5-325 MG tablet Take 1 tablet by mouth every 4 (four) hours as needed for moderate pain. 30 tablet 0  . levETIRAcetam (KEPPRA) 750 MG tablet Take 750 mg by mouth 2 (two) times daily.    . polycarbophil (FIBERCON) 625 MG tablet Take 625 mg by mouth daily.    . sertraline (ZOLOFT) 100 MG tablet Take 100 mg by mouth daily.    . temazepam (RESTORIL) 15 MG capsule Take 15 mg by mouth at bedtime.      Drug Regimen Review Drug regimen was reviewed and remains appropriate with no significant issues identified  Home: Home Living Family/patient expects to be discharged to:: Private residence Living Arrangements: Spouse/significant other Available Help at Discharge: Family, Available 24 hours/day Type of Home: House Home Access: Stairs to enter [employer] of Steps: 3 Entrance Stairs-Rails: None Home Layout: Two level, Able to live on main level with bedroom/bathroom Alternate Level Stairs-Number of Steps: flight Bathroom Shower/Tub: Associate Professor: Yes Home Equipment: None  Lives With: Spouse   Functional History: Prior Function Level of Independence: Independent Comments: no AD, drives; is an Tree surgeon adn enjoys reading  Functional Status:  Mobility: Bed Mobility Overal bed mobility: Needs Assistance Bed Mobility: Supine to Sit Supine to sit: HOB elevated, Mod assist General bed mobility comments: +rail, cues for sequencing Transfers Overall transfer level: Needs assistance Equipment used: None Transfers: Sit to/from Stand Sit to Stand: +2 safety/equipment, Mod assist General transfer comment: cues for sequencing, increased  time Ambulation/Gait Ambulation/Gait assistance: Mod assist, +2 safety/equipment Gait Distance (Feet): 45 Feet Assistive device: None Gait Pattern/deviations: Step-through pattern, Decreased stride length General Gait Details: unsteady gait requiring assist to maintain balance. Pt fatigues quickly  Gait velocity: decreased    ADL: ADL Overall ADL's : Needs assistance/impaired Eating/Feeding: NPO Grooming: Moderate assistance Upper Body Bathing: Moderate assistance, Sitting Lower Body Bathing: Maximal assistance, Sit to/from stand Upper Body Dressing : Maximal assistance, Sitting Lower Body Dressing: Maximal assistance, Sit to/from stand Lower Body Dressing Details (indicate cue type and reason): Able to donn R sock after set up; backward chaining used Toilet Transfer: Moderate assistance, +2 for physical assistance, Ambulation, Comfort height toilet Toileting- Clothing Manipulation and Hygiene: Maximal assistance, Sit to/from stand Functional mobility during ADLs: Moderate assistance, +2 for physical assistance General ADL Comments: Able to initiate tasks spontaneously; Difficulty completing tasks on command  Cognition: Cognition Overall Cognitive Status: Impaired/Different from baseline Arousal/Alertness: Awake/alert Orientation Level: Other (comment)(expressive aphasia) Attention: Sustained Sustained Attention: Impaired Sustained Attention Impairment: Verbal basic, Functional basic Memory: (TBA) Awareness: Impaired Awareness Impairment: Emergent impairment Problem Solving: Impaired Problem Solving Impairment: Functional basic Behaviors: Restless Safety/Judgment: Impaired [place] [id number] Scales of Cognitive Functioning: Confused/appropriate Cognition Arousal/Alertness: Awake/alert Behavior During Therapy: Flat affect Overall Cognitive Status: Impaired/Different from baseline Area of Impairment: Attention, Following commands, Safety/judgement, Awareness, Problem  solving Current Attention Level: Sustained Following Commands: Follows one step commands inconsistently, Follows one step commands with increased time Safety/Judgement: Decreased awareness of safety, Decreased awareness of deficits Awareness: Intellectual Problem Solving: Slow processing, Decreased initiation, Difficulty sequencing, Requires verbal cues, Requires tactile cues  Physical Exam: Blood pressure Marland Kitchen)  123/95, pulse 73, temperature 97.9 F (36.6 C), temperature source Oral, resp. rate 19, weight 65.4 kg, SpO2 98 %. Physical Exam  Constitutional: He appears well-developed and well-nourished.  HENT:  Head: Normocephalic and atraumatic.  Craniotomy site clean and dry with staples intact +NG  Eyes: EOM are normal. Right eye exhibits no discharge. Left eye exhibits no discharge.  Pupils reactive to light  Neck: Normal range of motion. Neck supple.  Cardiovascular:  Irregularly irregular  Respiratory: Effort normal and breath sounds normal. No respiratory distress.  GI: Soft. Bowel sounds are normal.  Musculoskeletal:     Comments: No edema or tenderness in extremities  Neurological: He is alert.  Global, expressive > receptive aphasia.  Motor: Limitted due to participation, moving b/l UE spontaneously, not moving b/l LE.    Skin:  See above  Psychiatric:  Unable to assess due to mentation    Results for orders placed or performed during the hospital encounter of 03/12/18 (from the past 48 hour(s))  Glucose, capillary     Status: Abnormal   Collection Time: 03/18/18 12:01 PM  Result Value Ref Range   Glucose-Capillary 110 (H) 70 - 99 mg/dL  Glucose, capillary     Status: Abnormal   Collection Time: 03/18/18  7:36 PM  Result Value Ref Range   Glucose-Capillary 108 (H) 70 - 99 mg/dL  Glucose, capillary     Status: Abnormal   Collection Time: 03/18/18 11:20 PM  Result Value Ref Range   Glucose-Capillary 118 (H) 70 - 99 mg/dL  Glucose, capillary     Status: Abnormal    Collection Time: 03/19/18  4:01 AM  Result Value Ref Range   Glucose-Capillary 129 (H) 70 - 99 mg/dL  Valproic acid level     Status: None   Collection Time: 03/19/18  5:44 AM  Result Value Ref Range   Valproic Acid Lvl 63 50.0 - 100.0 ug/mL    Comment: Performed at [facility], [address] [place], [place] [address]  CBC     Status: Abnormal   Collection Time: 03/19/18  5:44 AM  Result Value Ref Range   WBC 12.2 (H) 4.0 - 10.5 K/uL   RBC 3.58 (L) 4.22 - 5.81 MIL/uL   Hemoglobin 12.2 (L) 13.0 - 17.0 g/dL   HCT 60.4 (L) 54.0 - 98.1 %   MCV 106.4 (H) 80.0 - 100.0 fL   MCH 34.1 (H) 26.0 - 34.0 pg   MCHC 32.0 30.0 - 36.0 g/dL   RDW 19.1 47.8 - 29.5 %   Platelets 384 150 - 400 K/uL   nRBC 0.0 0.0 - 0.2 %    Comment: Performed at [facility], [address], [place], [place] [address]  Basic metabolic panel     Status: Abnormal   Collection Time: 03/19/18  5:44 AM  Result Value Ref Range   Sodium 136 135 - 145 mmol/L   Potassium 3.9 3.5 - 5.1 mmol/L   Chloride 97 (L) 98 - 111 mmol/L   CO2 27 22 - 32 mmol/L   Glucose, Bld 88 70 - 99 mg/dL   BUN 10 8 - 23 mg/dL   Creatinine, Ser 8.65 0.61 - 1.24 mg/dL   Calcium 9.0 8.9 - 78.4 mg/dL   GFR calc non Af Amer >60 >60 mL/min   GFR calc Af Amer >60 >60 mL/min   Anion gap 12 5 - 15    Comment: Performed at [facility], [address], [place],  [place] [address]  Glucose, capillary     Status: Abnormal   Collection Time: 03/19/18  8:13 AM  Result Value Ref Range   Glucose-Capillary 119 (H) 70 - 99 mg/dL  Glucose, capillary     Status: Abnormal   Collection Time: 03/19/18 11:45 AM  Result Value Ref Range   Glucose-Capillary 115 (H) 70 - 99 mg/dL  Glucose, capillary     Status: Abnormal   Collection Time: 03/19/18  4:57 PM  Result Value Ref Range   Glucose-Capillary 117 (H) 70 - 99 mg/dL  Glucose, capillary     Status: None   Collection Time: 03/19/18  8:07 PM  Result Value Ref Range   Glucose-Capillary 96 70  - 99 mg/dL  Glucose, capillary     Status: Abnormal   Collection Time: 03/19/18 10:54 PM  Result Value Ref Range   Glucose-Capillary 161 (H) 70 - 99 mg/dL  Glucose, capillary     Status: Abnormal   Collection Time: 03/20/18  3:36 AM  Result Value Ref Range   Glucose-Capillary 103 (H) 70 - 99 mg/dL  CBC     Status: Abnormal   Collection Time: 03/20/18  5:42 AM  Result Value Ref Range   WBC 9.9 4.0 - 10.5 K/uL   RBC 3.38 (L) 4.22 - 5.81 MIL/uL   Hemoglobin 11.8 (L) 13.0 - 17.0 g/dL   HCT 81.1 (L) 91.4 - 78.2 %   MCV 105.6 (H) 80.0 - 100.0 fL   MCH 34.9 (H) 26.0 - 34.0 pg   MCHC 33.1 30.0 - 36.0 g/dL   RDW 95.6 21.3 - 08.6 %   Platelets 371 150 - 400 K/uL   nRBC 0.0 0.0 - 0.2 %    Comment: Performed at [facility], [address], [place], [place] [address]  Basic metabolic panel     Status: Abnormal   Collection Time: 03/20/18  5:42 AM  Result Value Ref Range   Sodium 135 135 - 145 mmol/L   Potassium 4.1 3.5 - 5.1 mmol/L   Chloride 98 98 - 111 mmol/L   CO2 29 22 - 32 mmol/L   Glucose, Bld 114 (H) 70 - 99 mg/dL   BUN 10 8 - 23 mg/dL   Creatinine, Ser 9.62 0.61 - 1.24 mg/dL   Calcium 9.0 8.9 - 95.2 mg/dL   GFR calc non Af Amer >60 >60 mL/min   GFR calc Af Amer >60 >60 mL/min   Anion gap 8 5 - 15    Comment: Performed at [facility], [address] [place], [place] [address]  Glucose, capillary     Status: Abnormal   Collection Time: 03/20/18  7:38 AM  Result Value Ref Range   Glucose-Capillary 131 (H) 70 - 99 mg/dL   Comment 1 Notify RN    Comment 2 Document in Chart    Ct Angio Head W Or Wo Contrast  Result Date: 03/18/2018 CLINICAL DATA:  Follow up stroke. Status post subdural hematoma evacuation March 10, 2018. History of seizures and atrial fibrillation. EXAM: CT ANGIOGRAPHY HEAD AND NECK TECHNIQUE: Multidetector CT imaging of the head and neck was performed using the standard protocol during bolus administration of intravenous contrast. Multiplanar CT  image reconstructions and MIPs were obtained to evaluate the vascular anatomy. Carotid stenosis measurements (when applicable) are obtained utilizing NASCET criteria, using the distal internal carotid diameter as the denominator. CONTRAST:  ISOVUE-370 IOPAMIDOL (ISOVUE-370) INJECTION 76% COMPARISON:  MRI/MRA head March 13, 2018 and CT HEAD  March 10, 2018. FINDINGS: CT HEAD FINDINGS BRAIN: Status post evacuation of LEFT subdural hematoma with 6 mm residual low-density LEFT frontal component. Trace LEFT falcotentorial subdural hematoma. 5 mm low-density LEFT parafalcine fluid collection. 2 mm LEFT-to-RIGHT midline shift, improved. No intraparenchymal hemorrhage. No parenchymal brain volume loss for age. Faint supratentorial white matter hypodensities compatible with chronic small vessel ischemic changes, less than expected for age. LEFT extra-axial pneumocephalus and Surgicel. Basal cisterns are patent. VASCULAR: Mild calcific atherosclerosis of the carotid siphons. SKULL: New LEFT frontal craniotomy. Postoperative LEFT scalp soft tissue swelling with skin staples and minimal subcutaneous gas. SINUSES/ORBITS: Mild paranasal sinus mucosal thickening. Nasogastric tube via RIGHT nares. Mastoid air cells are well aerated.The included ocular globes and orbital contents are non-suspicious. OTHER: None. CTA NECK FINDINGS: AORTIC ARCH: Normal appearance of the thoracic arch, normal branch pattern. Mild calcific atherosclerosis aortic arch. The origins of the innominate, left Common carotid artery and subclavian artery are patent. RIGHT CAROTID SYSTEM: Common carotid artery is patent. Normal appearance of the carotid bifurcation without hemodynamically significant stenosis by NASCET criteria. Normal appearance of the internal carotid artery. LEFT CAROTID SYSTEM: Common carotid artery is patent. Trace calcific atherosclerosis of the carotid bifurcation without hemodynamically significant stenosis by NASCET criteria.  Fusiform dolichoectasia LEFT cervical internal carotid artery to 6 mm. VERTEBRAL ARTERIES:Nearly codominant vertebral arteries. Normal appearance of the vertebral arteries, widely patent. SKELETON: No acute osseous process though bone windows have not been submitted. Severe C3-4 through C6-7 spondylosis. Severe RIGHT C3-4, bilateral C4-5, LEFT C5-6 neural foraminal narrowing. Moderate canal stenosis C3-4, C4-5, C5-6. OTHER NECK: Soft tissues of the neck are nonacute though, not tailored for evaluation. UPPER CHEST: Moderate RIGHT and small LEFT pleural effusions. Mosaic attenuation suggesting pulmonary edema. Nasogastric tube in place. CTA HEAD FINDINGS: ANTERIOR CIRCULATION: Patent cervical internal carotid arteries, petrous, cavernous and supra clinoid internal carotid arteries. Patent anterior communicating artery. Patent anterior and middle cerebral arteries. Mild stenosis LEFT M 1 segment. No large vessel occlusion, flow-limiting stenosis, contrast extravasation or aneurysm. POSTERIOR CIRCULATION: Patent vertebral arteries, vertebrobasilar junction and basilar artery, as well as main branch vessels. Patent posterior cerebral arteries. No large vessel occlusion, flow-limiting stenosis, contrast extravasation or aneurysm. VENOUS SINUSES: Major dural venous sinuses are patent though not tailored for evaluation on this angiographic examination. ANATOMIC VARIANTS: None. DELAYED PHASE: No abnormal parenchymal enhancement. MIP images reviewed. IMPRESSION: CT HEAD: 1. Status post craniotomy for evacuation of subdural hematoma with 5 mm residual low-density component. 5 mm LEFT parafalcine hygroma and trace falcotentorial subdural hematoma. 2 mm residual LEFT-to-RIGHT midline shift. CTA NECK: 1. No hemodynamically significant stenosis ICA's. Patent vertebral arteries. 2. Dolichoectatic LEFT cervical ICA seen with atherosclerosis, hypertension or old intimal injury. 3. New moderate RIGHT small LEFT pleural effusions with  findings of pulmonary edema. Recommend follow-up chest radiograph. 4. Moderate canal stenosis C3-4 through C5-6. Severe C3-4 through C5-6 neural foraminal narrowing. CTA HEAD: 1. No emergent large vessel occlusion or flow-limiting stenosis. 2. Mild stenosis LEFT M 1 segment. Aortic Atherosclerosis (ICD10-I70.0). Electronically Signed   By: Awilda Metro M.D.   On: 03/18/2018 13:27   Dg Chest 2 View  Result Date: 03/19/2018 CLINICAL DATA:  Pleural effusion EXAM: CHEST - 2 VIEW COMPARISON:  03/13/2018 FINDINGS: Feeding tube has been placed with its tip beyond the fundus of the stomach. Small bilateral pleural effusions of the velum with bibasilar atelectasis. No pneumothorax. Cardiomegaly. Normal vascularity. IMPRESSION: Small bilateral pleural effusions and bibasilar atelectasis. Electronically Signed   By: Dahlia Client.D.  On: 03/19/2018 14:33   Ct Angio Neck W Or Wo Contrast  Result Date: 03/18/2018 CLINICAL DATA:  Follow up stroke. Status post subdural hematoma evacuation March 10, 2018. History of seizures and atrial fibrillation. EXAM: CT ANGIOGRAPHY HEAD AND NECK TECHNIQUE: Multidetector CT imaging of the head and neck was performed using the standard protocol during bolus administration of intravenous contrast. Multiplanar CT image reconstructions and MIPs were obtained to evaluate the vascular anatomy. Carotid stenosis measurements (when applicable) are obtained utilizing NASCET criteria, using the distal internal carotid diameter as the denominator. CONTRAST:  ISOVUE-370 IOPAMIDOL (ISOVUE-370) INJECTION 76% COMPARISON:  MRI/MRA head March 13, 2018 and CT HEAD March 10, 2018. FINDINGS: CT HEAD FINDINGS BRAIN: Status post evacuation of LEFT subdural hematoma with 6 mm residual low-density LEFT frontal component. Trace LEFT falcotentorial subdural hematoma. 5 mm low-density LEFT parafalcine fluid collection. 2 mm LEFT-to-RIGHT midline shift, improved. No intraparenchymal  hemorrhage. No parenchymal brain volume loss for age. Faint supratentorial white matter hypodensities compatible with chronic small vessel ischemic changes, less than expected for age. LEFT extra-axial pneumocephalus and Surgicel. Basal cisterns are patent. VASCULAR: Mild calcific atherosclerosis of the carotid siphons. SKULL: New LEFT frontal craniotomy. Postoperative LEFT scalp soft tissue swelling with skin staples and minimal subcutaneous gas. SINUSES/ORBITS: Mild paranasal sinus mucosal thickening. Nasogastric tube via RIGHT nares. Mastoid air cells are well aerated.The included ocular globes and orbital contents are non-suspicious. OTHER: None. CTA NECK FINDINGS: AORTIC ARCH: Normal appearance of the thoracic arch, normal branch pattern. Mild calcific atherosclerosis aortic arch. The origins of the innominate, left Common carotid artery and subclavian artery are patent. RIGHT CAROTID SYSTEM: Common carotid artery is patent. Normal appearance of the carotid bifurcation without hemodynamically significant stenosis by NASCET criteria. Normal appearance of the internal carotid artery. LEFT CAROTID SYSTEM: Common carotid artery is patent. Trace calcific atherosclerosis of the carotid bifurcation without hemodynamically significant stenosis by NASCET criteria. Fusiform dolichoectasia LEFT cervical internal carotid artery to 6 mm. VERTEBRAL ARTERIES:Nearly codominant vertebral arteries. Normal appearance of the vertebral arteries, widely patent. SKELETON: No acute osseous process though bone windows have not been submitted. Severe C3-4 through C6-7 spondylosis. Severe RIGHT C3-4, bilateral C4-5, LEFT C5-6 neural foraminal narrowing. Moderate canal stenosis C3-4, C4-5, C5-6. OTHER NECK: Soft tissues of the neck are nonacute though, not tailored for evaluation. UPPER CHEST: Moderate RIGHT and small LEFT pleural effusions. Mosaic attenuation suggesting pulmonary edema. Nasogastric tube in place. CTA HEAD FINDINGS:  ANTERIOR CIRCULATION: Patent cervical internal carotid arteries, petrous, cavernous and supra clinoid internal carotid arteries. Patent anterior communicating artery. Patent anterior and middle cerebral arteries. Mild stenosis LEFT M 1 segment. No large vessel occlusion, flow-limiting stenosis, contrast extravasation or aneurysm. POSTERIOR CIRCULATION: Patent vertebral arteries, vertebrobasilar junction and basilar artery, as well as main branch vessels. Patent posterior cerebral arteries. No large vessel occlusion, flow-limiting stenosis, contrast extravasation or aneurysm. VENOUS SINUSES: Major dural venous sinuses are patent though not tailored for evaluation on this angiographic examination. ANATOMIC VARIANTS: None. DELAYED PHASE: No abnormal parenchymal enhancement. MIP images reviewed. IMPRESSION: CT HEAD: 1. Status post craniotomy for evacuation of subdural hematoma with 5 mm residual low-density component. 5 mm LEFT parafalcine hygroma and trace falcotentorial subdural hematoma. 2 mm residual LEFT-to-RIGHT midline shift. CTA NECK: 1. No hemodynamically significant stenosis ICA's. Patent vertebral arteries. 2. Dolichoectatic LEFT cervical ICA seen with atherosclerosis, hypertension or old intimal injury. 3. New moderate RIGHT small LEFT pleural effusions with findings of pulmonary edema. Recommend follow-up chest radiograph. 4. Moderate canal stenosis C3-4 through  C5-6. Severe C3-4 through C5-6 neural foraminal narrowing. CTA HEAD: 1. No emergent large vessel occlusion or flow-limiting stenosis. 2. Mild stenosis LEFT M 1 segment. Aortic Atherosclerosis (ICD10-I70.0). Electronically Signed   By: Awilda Metro M.D.   On: 03/18/2018 13:27       Medical Problem List and Plan: 1.  Mild right hemiparesis with aphasia/dysphasia secondary to traumatic left extraaxial hematoma. Status post left frontotemporal parietal craniotomy evacuation of subdural hematoma 03/10/2018 2.   Antithrombotics: -DVT/anticoagulation:  SCDs. No anticoagulation due to subdural hematoma  -antiplatelet therapy: Not applicable 3. Pain Management:  Hydrocodone as needed 4. Mood:  Zoloft 100 mg daily, Restoril 15 mg daily at bedtime  -antipsychotic agents: not applicable 5. Neuropsych: This patient is not capable of making decisions on his own behalf. 6. Skin/Wound Care:  Routine skin checks 7. Fluids/Electrolytes/Nutrition:  Routine in and out's with follow-up chemistries 8. Seizure disorder. Keppra 1500 mg twice a day, Vimpat 200 mg twice a day, valproic acid 500 mg every 8 hours. Plan to continue this regimen to follow up outpatient with neurology services. 9. New-onset atrial fibrillation with RVR. Cardizem initiated 60 mg every 6 hours 03/16/2018. Cardiac rate control. No anticoagulation due to subdural hematoma. Follow-up cardiology services Dr. Garnette Scheuermann 10. Dysphagia. Dysphagia #2 thin liquids.Follow-up modified barium swallow   Charlton Amor, PA-C 03/20/2018  Maryla Morrow, MD, ABPMR

## 2018-03-21 ENCOUNTER — Inpatient Hospital Stay (HOSPITAL_COMMUNITY): Payer: Medicare Other | Admitting: Physical Therapy

## 2018-03-21 ENCOUNTER — Inpatient Hospital Stay (HOSPITAL_COMMUNITY): Payer: Medicare Other | Admitting: Occupational Therapy

## 2018-03-21 ENCOUNTER — Inpatient Hospital Stay (HOSPITAL_COMMUNITY): Payer: Medicare Other | Admitting: Speech Pathology

## 2018-03-21 ENCOUNTER — Inpatient Hospital Stay (HOSPITAL_COMMUNITY): Payer: Medicare Other

## 2018-03-21 DIAGNOSIS — D638 Anemia in other chronic diseases classified elsewhere: Secondary | ICD-10-CM

## 2018-03-21 DIAGNOSIS — R569 Unspecified convulsions: Secondary | ICD-10-CM

## 2018-03-21 DIAGNOSIS — I4891 Unspecified atrial fibrillation: Secondary | ICD-10-CM

## 2018-03-21 DIAGNOSIS — S065X0D Traumatic subdural hemorrhage without loss of consciousness, subsequent encounter: Secondary | ICD-10-CM

## 2018-03-21 DIAGNOSIS — R1310 Dysphagia, unspecified: Secondary | ICD-10-CM

## 2018-03-21 DIAGNOSIS — D531 Other megaloblastic anemias, not elsewhere classified: Secondary | ICD-10-CM

## 2018-03-21 DIAGNOSIS — E46 Unspecified protein-calorie malnutrition: Secondary | ICD-10-CM

## 2018-03-21 DIAGNOSIS — D72829 Elevated white blood cell count, unspecified: Secondary | ICD-10-CM

## 2018-03-21 LAB — COMPREHENSIVE METABOLIC PANEL
ALT: 25 U/L (ref 0–44)
AST: 30 U/L (ref 15–41)
Albumin: 3.2 g/dL — ABNORMAL LOW (ref 3.5–5.0)
Alkaline Phosphatase: 63 U/L (ref 38–126)
Anion gap: 10 (ref 5–15)
BUN: 10 mg/dL (ref 8–23)
CO2: 27 mmol/L (ref 22–32)
Calcium: 9.2 mg/dL (ref 8.9–10.3)
Chloride: 99 mmol/L (ref 98–111)
Creatinine, Ser: 0.76 mg/dL (ref 0.61–1.24)
GFR calc Af Amer: >60 mL/min (ref >60)
GFR calc non Af Amer: >60 mL/min (ref >60)
Glucose, Bld: 125 mg/dL — ABNORMAL HIGH (ref 70–99)
Potassium: 4.3 mmol/L (ref 3.5–5.1)
Sodium: 136 mmol/L (ref 135–145)
TOTAL PROTEIN: 6.4 g/dL — AB (ref 6.5–8.1)
Total Bilirubin: 0.8 mg/dL (ref 0.3–1.2)

## 2018-03-21 LAB — CBC WITH DIFFERENTIAL/PLATELET
Abs Immature Granulocytes: 0.21 10*3/uL — ABNORMAL HIGH (ref 0.00–0.07)
BASOS ABS: 0 10*3/uL (ref 0.0–0.1)
Basophils Relative: 0 %
Eosinophils Absolute: 0.3 10*3/uL (ref 0.0–0.5)
Eosinophils Relative: 2 %
HCT: 37.7 % — ABNORMAL LOW (ref 39.0–52.0)
Hemoglobin: 12.3 g/dL — ABNORMAL LOW (ref 13.0–17.0)
Immature Granulocytes: 2 %
Lymphocytes Relative: 19 %
Lymphs Abs: 2.4 10*3/uL (ref 0.7–4.0)
MCH: 34.4 pg — ABNORMAL HIGH (ref 26.0–34.0)
MCHC: 32.6 g/dL (ref 30.0–36.0)
MCV: 105.3 fL — ABNORMAL HIGH (ref 80.0–100.0)
Monocytes Absolute: 1.3 10*3/uL — ABNORMAL HIGH (ref 0.1–1.0)
Monocytes Relative: 10 %
Neutro Abs: 8.2 10*3/uL — ABNORMAL HIGH (ref 1.7–7.7)
Neutrophils Relative %: 67 %
Platelets: 420 10*3/uL — ABNORMAL HIGH (ref 150–400)
RBC: 3.58 MIL/uL — ABNORMAL LOW (ref 4.22–5.81)
RDW: 13.4 % (ref 11.5–15.5)
WBC: 12.3 10*3/uL — ABNORMAL HIGH (ref 4.0–10.5)
nRBC: 0 % (ref 0.0–0.2)

## 2018-03-21 MED ORDER — PRO-STAT SUGAR FREE PO LIQD
30.0000 mL | Freq: Two times a day (BID) | ORAL | Status: DC
  Administered 2018-03-21 – 2018-04-11 (×42): 30 mL via ORAL
  Filled 2018-03-21 (×42): qty 30

## 2018-03-21 MED ORDER — ENSURE ENLIVE PO LIQD
237.0000 mL | Freq: Two times a day (BID) | ORAL | Status: DC
  Administered 2018-03-21 – 2018-04-04 (×20): 237 mL via ORAL

## 2018-03-21 NOTE — Plan of Care (Signed)
  Problem: RH BOWEL ELIMINATION Goal: RH STG MANAGE BOWEL WITH ASSISTANCE Description STG Manage Bowel with Assistance. Mod  Outcome: Progressing   Problem: RH BLADDER ELIMINATION Goal: RH STG MANAGE BLADDER WITH ASSISTANCE Description STG Manage Bladder With Assistance. Mod  Outcome: Progressing   Problem: RH SKIN INTEGRITY Goal: RH STG SKIN FREE OF INFECTION/BREAKDOWN Description Free of breakdown, infection while on rehab mod assist.   Outcome: Progressing   Problem: RH SAFETY Goal: RH STG ADHERE TO SAFETY PRECAUTIONS W/ASSISTANCE/DEVICE Description STG Adhere to Safety Precautions With Assistance/Device.Mod  Outcome: Progressing   Problem: RH COGNITION-NURSING Goal: RH STG USES MEMORY AIDS/STRATEGIES W/ASSIST TO PROBLEM SOLVE Description STG Uses Memory Aids/Strategies With Assistance to Problem Solve. Mod  Outcome: Progressing

## 2018-03-21 NOTE — Progress Notes (Signed)
Initial Nutrition Assessment  DOCUMENTATION CODES:   Severe malnutrition in context of acute illness/injury  INTERVENTION:   - Ensure Enlive po BID, each supplement provides 350 kcal and 20 grams of protein  - Continue Pro-stat 30 ml BID, each supplement provides 100 kcal and 15 grams of protein  - Encourage adequate PO intake and provide feeding assistance as needed  NUTRITION DIAGNOSIS:   Severe Malnutrition related to acute illness (left SDH s/p crani and resulting dysphagia) as evidenced by mild fat depletion, mild muscle depletion, moderate muscle depletion, severe muscle depletion, percent weight loss (8.1% weight loss in less than 1 month).  GOAL:   Patient will meet greater than or equal to 90% of their needs  MONITOR:   PO intake, Supplement acceptance, Diet advancement, Skin, Labs, Weight trends  REASON FOR ASSESSMENT:   Malnutrition Screening Tool, Consult Enteral/tube feeding initiation and management  ASSESSMENT:   83 year old male with PMH significant for seizures on Keppra. Pt admitted to [facility] on 2/16 with L SDH s/p crani 2/14. Pt was discharged 2/16 but readmitted that same day with AMS/aphasia and afib with RVR. Pt had Cortrak placed on 2/19 for enteral nutrition. Pt had MBS 2/24 with recommendations for Dysphagia 2 diet with thin liquids. Pt admitted to CIR on 2/24.  Pt admitted on TF which were stopped this AM. Cortrak also removed this AM. TF d/c. Discussed pt with RN.  Met with pt at bedside. Pt with difficulty communicating but agreed for RD to completed NFPE.  Reviewed RD note from previous admission. Per note, pt's wife reports that "pt eats 3 meals per day and usually has a snack and a dessert. She does mention that both her and pt have ate less in the last 6 months but still eat adequately. She reports his usual weight is 155-160 lb. No changes in how clothes fit."  Will discuss with family on follow-up.  Per weight history in chart, pt with 5.8 kg  weight loss since 03/10/18. This is an 8.1% weight loss in less than 1 month which is significant for timeframe.  Meal Completion: 40-50% x 2 meals  Medications reviewed and include: B-complex with vitamin C, Colace, Pro-stat BID, Fibercon  Labs reviewed. CBG's: 102 x 24 hours  UOP: 1050 ml x 24 hours  NUTRITION - FOCUSED PHYSICAL EXAM:    Most Recent Value  Orbital Region  Mild depletion  Upper Arm Region  Mild depletion  Thoracic and Lumbar Region  No depletion  Buccal Region  Mild depletion  Temple Region  Mild depletion  Clavicle Bone Region  Mild depletion  Clavicle and Acromion Bone Region  Severe depletion  Scapular Bone Region  Mild depletion  Dorsal Hand  Mild depletion  Patellar Region  Severe depletion  Anterior Thigh Region  Severe depletion  Posterior Calf Region  Moderate depletion  Edema (RD Assessment)  None  Hair  Reviewed  Eyes  Reviewed  Mouth  Reviewed  Skin  Reviewed  Nails  Reviewed       Diet Order:   Diet Order            DIET DYS 2 Room service appropriate? Yes; Fluid consistency: Thin  Diet effective now              EDUCATION NEEDS:   Not appropriate for education at this time  Skin:  Skin Assessment: (closed incision to head)  Last BM:  2/25 medium type 4  Height:   Ht Readings from Last 1 Encounters:  03/20/18 '5\' 11"'$  (1.803 m)    Weight:   Wt Readings from Last 1 Encounters:  03/21/18 65.4 kg    Ideal Body Weight:  78.2 kg  BMI:  Body mass index is 20.11 kg/m.  Estimated Nutritional Needs:   Kcal:  1850-2050  Protein:  90-105 grams  Fluid:  >/= 1.8 L    Gaynell Face, MS, RD, LDN Inpatient Clinical Dietitian Pager: [phone] Weekend/After Hours: [phone]

## 2018-03-21 NOTE — Evaluation (Signed)
Physical Therapy Assessment and Plan  Patient Details  Name: Alvin Hudson MRN: [id number] Date of Birth: October 14, 1935  PT Diagnosis: Cognitive deficits, Difficulty walking, Hemiplegia dominant, Impaired cognition, Impaired sensation and Muscle weakness Rehab Potential: Good ELOS: 14-18 days   Today's Date: 03/21/2018 PT Individual Time: 0930-1015 PT Individual Time Calculation (min): 45 min    Problem List:  Patient Active Problem List   Diagnosis Date Noted  . Anemia of chronic disease   . Megaloblastic anemia   . Hypoalbuminemia due to protein-calorie malnutrition ([place])   . Leukocytosis   . New onset atrial fibrillation ([place])   . Seizures ([place])   . Traumatic subdural hematoma ([place]) 03/20/2018  . History of subdural hematoma   . Dysphagia   . Atrial fibrillation ([place]) 03/17/2018  . Aphasia 03/12/2018  . Seizure ([place]) 03/12/2018  . Subdural hematoma ([place]) 03/10/2018    Past Medical History:  Past Medical History:  Diagnosis Date  . Allergy   . Seizures ([place])    Past Surgical History:  Past Surgical History:  Procedure Laterality Date  . CRANIOTOMY Left 03/10/2018   Procedure: CRANIOTOMY HEMATOMA EVACUATION SUBDURAL;  Surgeon: Newman Pies, MD;  Location: [place];  Service: Neurosurgery;  Laterality: Left;  . INGUINAL HERNIA REPAIR Bilateral     Assessment & Plan Clinical Impression: Patient is an 83 y.o. year old male with recent admission to the hospital on 03/10/2018 after a recent fall initial cranial CT scan negative. Patient lives with spouse. Independent prior to initial admission of 03/10/2018. Patient is very active and drives. Two level home with 2 steps to entry bedroom on main level. He developed progressive headaches. He was taken to [facility] or scans were obtained attempts initially made to transfer to [place] but they could not accommodate him because the ICU was full. X-rays and imaging follow-up  at [facility] showed acute left subdural  hematoma. Patient was transferred to [facility] and underwent left frontotemporal parietal craniotomy for evacuation of subdural hematoma 03/10/2018 per Dr. Newman Pies. Patient progressed nicely and initial therapy evaluation and ambulating 500 feet without assistive device and was discharged home 03/12/2018. Patient was readmitted same day after family reports decrease in mobility as well as speech difficulty and dysphagia. Follow-up CT/MRI reviewed, showing left frontal extra-axial hematoma.  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 No intracranial large vessel occlusion. 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 Was an incidental note of moderate right small left pleural effusions with findings of pulmonary edema with follow-up chest x-ray showing small bilateral pleural effusions and bibasilar atelectasis no change in plan of care. Noted seizure on 03/15/2018.Follow-up EEGs negative for seizure. Currently maintained on Keppra, valproic acid as well as Vimpat for seizure prophylaxis.plans to currently continue diet epileptic regimen until follow-up as outpatient with neurology.  Patient new-onset A. Fib with RVR initially placed on Cardizem drip transition to Cardizem. Troponin mildly elevated 0.04-0.15 felt to be related to demand ischemia. Echocardiogram with ejection fraction of 65%. Normal systolic function. Patient is currently NPO with nasogastric tube in place for nutritional support with follow-up modified barium swallow completed 03/20/2018 placed on a dysphagia #2 thin liquid diet..  Patient transferred to  [facility] on 81/24/2020 .   Patient currently requires mod  with mobility secondary to muscle weakness, abnormal tone, decreased coordination and decreased motor planning, decreased initiation, decreased attention, decreased awareness, decreased problem solving, decreased safety awareness, decreased memory and delayed processing and decreased standing balance, hemiplegia and decreased balance strategies.  Prior to hospitalization, patient was independent  with mobility and lived with Spouse in a House home.  Home access is 4Stairs to enter.  Patient will benefit from skilled PT intervention to maximize safe functional mobility, minimize fall risk and decrease caregiver burden for planned discharge home with 24 hour supervision.  Anticipate patient will benefit from follow up [place] at discharge.  PT - End of Session Activity Tolerance: Tolerates 30+ min activity with multiple rests Endurance Deficit: Yes PT Assessment Rehab Potential (ACUTE/IP ONLY): Good PT Patient demonstrates impairments in the following area(s): Balance;Endurance;Motor;Safety;Sensory PT Transfers Functional Problem(s): Bed Mobility;Bed to Chair;Car;Furniture PT Locomotion Functional Problem(s): Stairs;Ambulation;Wheelchair Mobility PT Plan PT Intensity: Minimum of 1-2 x/day ,45 to 90 minutes PT Frequency: 5 out of 7 days PT Duration Estimated Length of Stay: 14-18 days PT Treatment/Interventions: Ambulation/gait training;Community reintegration;DME/adaptive equipment instruction;Stair training;UE/LE Strength taining/ROM;Wheelchair propulsion/positioning;Balance/vestibular training;Discharge planning;Functional electrical stimulation;Pain management;Therapeutic Activities;UE/LE Coordination activities;Therapeutic Exercise;Splinting/orthotics;Patient/family education;Functional mobility training;Cognitive remediation/compensation;Neuromuscular re-education PT Transfers Anticipated Outcome(s): supervision PT Locomotion Anticipated Outcome(s): supervision PT Recommendation Follow Up  Recommendations: Home health PT;Outpatient PT Patient destination: Home Equipment Recommended: To be determined  Skilled Therapeutic Intervention Pt rec'd after finishing toileting with nursing. Pt was fatigued but agreeable to treatment with encouragement from PT and pt's wife.  Pt performs sit <> Stand to [place] pants with mod A, mod cuing for sequencing. Pt performs gait without AD x 10' with mod A, distance limited by fatigue. Pt performs gait with RW x 100', 50' with min A, cues for attention to task as pt is very internally distracted. Pt performs stair negotiation with mod A with bilat handrails x 4 stairs.  Simulated car transfer with mod A.  Pt limited by fatigue and refuses further treatment. Attempt to perform w/c mobility with bilat UEs, limited by decreased sensation in Rt UE.  Pt left in bed with alarm set,  Needs at hand, RN present.  PT Evaluation Precautions/Restrictions Precautions Precautions: Fall Restrictions Weight Bearing Restrictions: No Pain Pain Assessment Faces Pain Scale: No hurt Home Living/Prior Functioning Home Living Available Help at Discharge: Family;Available 24 hours/day Type of Home: House Home Access: Stairs to enter [employer] of Steps: 4 Entrance Stairs-Rails: Right Home Layout: Two level;Able to live on main level with bedroom/bathroom Alternate Level Stairs-Number of Steps: flight  Lives With: Spouse Prior Function Level of Independence: Independent with basic ADLs;Independent with transfers;Independent with gait  Able to Take Stairs?: Yes Driving: Yes Vocation: Retired  Associate Professor Overall Cognitive Status: Impaired/Different from baseline Arousal/Alertness: Awake/alert Orientation Level: Oriented to person Sustained Attention: Impaired Sustained Attention Impairment: Verbal basic;Functional basic Awareness: Impaired Awareness Impairment: Emergent impairment Safety/Judgment: Impaired Sensation Sensation Light Touch: Impaired  Detail Light Touch Impaired Details: Impaired RLE;Impaired RUE Proprioception: Impaired Detail Proprioception Impaired Details: Impaired RLE;Impaired RUE Coordination Gross Motor Movements are Fluid and Coordinated: No Fine Motor Movements are Fluid and Coordinated: No Coordination and Movement Description: mild Rt hemiplegia Motor  Motor Motor: Hemiplegia Motor - Skilled Clinical Observations: mild Rt hemiplegia  Mobility Bed Mobility Bed Mobility: Supine to Sit Supine to Sit: Supervision/Verbal cueing Transfers Transfers: Stand Pivot Transfers Stand Pivot Transfers: Moderate Assistance - Patient 50 - 74% Transfer (Assistive device): None Locomotion  Gait Ambulation:  Yes Gait Assistance: Moderate Assistance - Patient 50-74% Gait Distance (Feet): 10 Feet Assistive device: None Stairs / Additional Locomotion Stairs: Yes Stairs Assistance: Moderate Assistance - Patient 50 - 74% Stair Management Technique: Two rails Number of Stairs: 4 Wheelchair Mobility Wheelchair Mobility: Yes Wheelchair Assistance: Minimal assistance - Patient >75% Wheelchair Propulsion: Both upper extremities Wheelchair Parts Management: Needs assistance  Trunk/Postural Assessment  Cervical Assessment Cervical Assessment: (fwd head) Thoracic Assessment Thoracic Assessment: (mild kyphosis) Lumbar Assessment Lumbar Assessment: (posterior pelvic tilt) Postural Control Postural Control: Deficits on evaluation Righting Reactions: delayed  Balance Dynamic Sitting Balance Sitting balance - Comments: min guard Dynamic Standing Balance Dynamic Standing - Comments: mod A Extremity Assessment      RLE Assessment General Strength Comments: grossly 3/5 LLE Assessment General Strength Comments: grossly 4/5    Refer to Care Plan for Long Term Goals  Recommendations for other services: None   Discharge Criteria: Patient will be discharged from PT if patient refuses treatment 3 consecutive times  without medical reason, if treatment goals not met, if there is a change in medical status, if patient makes no progress towards goals or if patient is discharged from hospital.  The above assessment, treatment plan, treatment alternatives and goals were discussed and mutually agreed upon: by patient and by family  , 03/21/2018, 10:24 AM

## 2018-03-21 NOTE — Progress Notes (Signed)
Inpatient Rehabilitation  Patient information reviewed and entered into eRehab system by Jasleen Riepe M. Eldwin Volkov, M.A., CCC/SLP, PPS Coordinator.  Information including medical coding, functional ability and quality indicators will be reviewed and updated through discharge.    Per nursing patient was given "Data Collection Information Summary" for Patients in Inpatient Rehabilitation Facilities with attached "Privacy Act Statement-Health Care Records" upon admission.   

## 2018-03-21 NOTE — Plan of Care (Signed)
  Problem: RH BOWEL ELIMINATION Goal: RH STG MANAGE BOWEL WITH ASSISTANCE Description STG Manage Bowel with Assistance. Mod  Outcome: Progressing   Problem: RH BLADDER ELIMINATION Goal: RH STG MANAGE BLADDER WITH ASSISTANCE Description STG Manage Bladder With Assistance. Mod  Outcome: Progressing   Problem: RH SKIN INTEGRITY Goal: RH STG SKIN FREE OF INFECTION/BREAKDOWN Description Free of breakdown, infection while on rehab mod assist.   Outcome: Progressing   Problem: RH SAFETY Goal: RH STG ADHERE TO SAFETY PRECAUTIONS W/ASSISTANCE/DEVICE Description STG Adhere to Safety Precautions With Assistance/Device.Mod  Outcome: Progressing   Problem: RH COGNITION-NURSING Goal: RH STG USES MEMORY AIDS/STRATEGIES W/ASSIST TO PROBLEM SOLVE Description STG Uses Memory Aids/Strategies With Assistance to Problem Solve. Mod  Outcome: Progressing   Problem: RH KNOWLEDGE DEFICIT BRAIN INJURY Goal: RH STG INCREASE KNOWLEDGE OF SELF CARE AFTER BRAIN INJURY Description Patient and family will be able to describe care of patient following BI with cues, handouts  Outcome: Progressing

## 2018-03-21 NOTE — Patient Care Conference (Signed)
Inpatient RehabilitationTeam Conference and Plan of Care Update Date: 03/21/2018   Time: 10:50 AM    Patient Name: Alvin Hudson      Medical Record Number: [id number]  Date of Birth: [birth year] Sex: Male         Room/Bed: 4W05C/4W05C-01 Payor Info: Payor: MEDICARE / Plan: MEDICARE PART A AND B / Product Type: *No Product type* /    Admitting Diagnosis: SDH  Admit Date/Time:  03/20/2018  3:51 PM Admission Comments: No comment available   Primary Diagnosis:  <principal problem not specified> Principal Problem: <principal problem not specified>  Patient Active Problem List   Diagnosis Date Noted  . Anemia of chronic disease   . Megaloblastic anemia   . Hypoalbuminemia due to protein-calorie malnutrition ([facility])   . Leukocytosis   . New onset atrial fibrillation ([facility])   . Seizures ([facility])   . 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 History of subdural hematoma   . Dysphagia   . Atrial fibrillation ([facility]) 03/17/2018  . Aphasia 03/12/2018  . Seizure ([facility]) 03/12/2018  . Subdural hematoma ([facility]) 03/10/2018    Expected Discharge Date: Expected Discharge Date: (2 weeks)  Team Members Present: Physician leading conference: Dr. Maryla Morrow Social Worker Present: Amada Jupiter, LCSW Nurse Present: Otilio Carpen, LPN PT Present: Judieth Keens, PT OT Present: Kearney Hard, OT SLP Present: Feliberto Gottron, SLP PPS Coordinator present : Fae Pippin     Current Status/Progress Goal Weekly Team Focus  Medical   Mild right hemiparesis with aphasia/dysphasia secondary to traumatic SDH. Status post left frontotemporal parietal craniotomy evacuation of subdural hematoma 03/10/2018  Improve mobility, Afib, dysphagia, anemia, WBCs  See above   Bowel/Bladder   Incontinent of bowel and bladder  Regain regular pattern of bowel and bladder  Timed toileting, assist with toileting needs   Swallow/Nutrition/ Hydration   Dys. 2 textures with thin liquids, Mod A  Supervision  tolerance of current  diet, trials of Dys. 3, increased use of swallowing strategies   ADL's   Mod A overall  Supervision  attention. initiation, apraxia, functional use of R UE, activity tolerance, modified bathing/dressing   Mobility   mod A transfers and gait with RW  supervision  attention, activity tolerance, motor planning, balance   Communication   Max A  Min A  verbal epxression, awareness of errors   Safety/Cognition/ Behavioral Observations  Mod-Max A  Min A  sustianed attention, awareness, problem solving    Pain   Denies pain  Remain pain free, pain <3/10  Assess pain every shift and as needed   Skin   Rash noted to back, surgical incision to head-approximated  No s/sx of infection, no other skin issues  Assess skin every shift and as needed    Rehab Goals Patient on target to meet rehab goals: Yes *See Care Plan and progress notes for long and short-term goals.     Barriers to Discharge  Current Status/Progress Possible Resolutions Date Resolved   Physician    Medical stability;Decreased caregiver support;Lack of/limited family support;Behavior     See above  Therapies, seizure meds, advance diet as tolerated, follow labs, workup for anemia      Nursing  Decreased caregiver support;Incontinence  Supportive spouse but physical limitations            PT                    OT  SLP                SW                Discharge Planning/Teaching Needs:  Plan for pt to d/c home with wife to provide 24/7 supervision.  Teaching needs TBD.   Team Discussion:  New eval;  Sz, bp, WBC issues.  NG d/c'd today and placed on D2, thin.  Some incontinence.  Min-mod assist amb ~ 10-100 ' (with/ without AD)OT eval pending.  Severe aphasia.  Revisions to Treatment Plan:  NA    Continued Need for Acute Rehabilitation Level of Care: The patient requires daily medical management by a physician with specialized training in physical medicine and rehabilitation for the following  conditions: Daily direction of a multidisciplinary physical rehabilitation program to ensure safe treatment while eliciting the highest outcome that is of practical value to the patient.: Yes Daily medical management of patient stability for increased activity during participation in an intensive rehabilitation regime.: Yes Daily analysis of laboratory values and/or radiology reports with any subsequent need for medication adjustment of medical intervention for : Neurological problems;Cardiac problems;Other;Nutritional problems   I attest that I was present, lead the team conference, and concur with the assessment and plan of the team.   Nance Mccombs 03/21/2018, 4:23 PM

## 2018-03-21 NOTE — IPOC Note (Signed)
Overall Plan of Care [facility]) Patient Details Name: Alvin Hudson MRN: [id number] DOB: 12-18-1935  Admitting Diagnosis: Left SDH  Hospital Problems: Active Problems:   Traumatic subdural hematoma (HCC)   Anemia of chronic disease   Megaloblastic anemia   Hypoalbuminemia due to protein-calorie malnutrition (HCC)   Leukocytosis   New onset atrial fibrillation (HCC)   Seizures (HCC)     Functional Problem List: Nursing Behavior, Bladder, Bowel, Endurance, Nutrition, Safety, Skin Integrity  PT Balance, Endurance, Motor, Safety, Sensory  OT Balance, Cognition, Endurance, Motor, Perception, Safety, Sensory  SLP Cognition, Nutrition, Linguistic  TR         Basic ADL's: OT Eating, Grooming, Bathing, Dressing, Toileting     Advanced  ADL's: OT       Transfers: PT Bed Mobility, Bed to Chair, Car, Occupational psychologist, Research scientist (life sciences): PT Stairs, Ambulation, Psychologist, prison and probation services     Additional Impairments: OT Fuctional Use of Upper Extremity  SLP Swallowing, Communication, Social Cognition comprehension, expression Social Interaction, Problem Solving, Attention, Awareness, Memory  TR      Anticipated Outcomes Item Anticipated Outcome  Self Feeding Supervision  Swallowing  Supervision   Basic self-care  Supervision  Toileting  Supervision   Bathroom Transfers Supervision  Bowel/Bladder  regain continence of bowel and bladder, maintain regular pattern of emptying bowel and bladder  Transfers  supervision  Locomotion  supervision  Communication  Min A  Cognition  Min A  Pain  no pain or less than 3  Safety/Judgment  Remain free of falls, infection and skin breakdoqn   Therapy Plan: PT Intensity: Minimum of 1-2 x/day ,45 to 90 minutes PT Frequency: 5 out of 7 days PT Duration Estimated Length of Stay: 14-18 days OT Intensity: Minimum of 1-2 x/day, 45 to 90 minutes OT Frequency: 5 out of 7 days OT Duration/Estimated Length of Stay: 14-18 days SLP  Intensity: Minumum of 1-2 x/day, 30 to 90 minutes SLP Frequency: 3 to 5 out of 7 days SLP Duration/Estimated Length of Stay: 14-18 days     Team Interventions: Nursing Interventions Patient/Family Education, Bowel Management, Skin Care/Wound Management, Dysphagia/Aspiration Precaution Training, Psychosocial Support, Bladder Management, Cognitive Remediation/Compensation, Discharge Planning, Medication Management, Disease Management/Prevention  PT interventions Ambulation/gait training, Community reintegration, DME/adaptive equipment instruction, Museum/gallery curator, UE/LE Strength taining/ROM, Wheelchair propulsion/positioning, Warden/ranger, Discharge planning, Functional electrical stimulation, Pain management, Therapeutic Activities, UE/LE Coordination activities, Therapeutic Exercise, Splinting/orthotics, Patient/family education, Functional mobility training, Cognitive remediation/compensation, Neuromuscular re-education  OT Interventions Warden/ranger, Cognitive remediation/compensation, Community reintegration, Discharge planning, Disease mangement/prevention, DME/adaptive equipment instruction, Functional mobility training, Neuromuscular re-education, Patient/family education, Psychosocial support, Self Care/advanced ADL retraining, Therapeutic Activities, Therapeutic Exercise, UE/LE Strength taining/ROM, UE/LE Coordination activities, Visual/perceptual remediation/compensation, Wheelchair propulsion/positioning  SLP Interventions Cognitive remediation/compensation, Environmental controls, Internal/external aids, Speech/Language facilitation, Therapeutic Activities, Patient/family education, Functional tasks, Dysphagia/aspiration precaution training, Cueing hierarchy  TR Interventions    SW/CM Interventions Discharge Planning, Psychosocial Support, Patient/Family Education   Barriers to Discharge MD  Medical stability and Nutritional means  Nursing Decreased caregiver  support, Incontinence Supportive spouse but physical limitations  PT      OT      SLP      SW       Team Discharge Planning: Destination: PT-Home ,OT- Home , SLP-Home Projected Follow-up: PT-Home health PT, Outpatient PT, OT-  Home health OT, SLP-24 hour supervision/assistance, Home Health SLP, Outpatient SLP Projected Equipment Needs: PT-To be determined, OT- To be determined, SLP-None recommended by SLP Equipment Details: PT- ,  OT-  Patient/family involved in discharge planning: PT- Family member/caregiver, Patient,  OT-Patient unable/family or caregiver not available, SLP-Patient, Family member/caregiver  MD ELOS: 14-18 days. Medical Rehab Prognosis:  Good Assessment: 83 year old right handed male with history of seizure 2 years ago maintained on Keppra followed by Dr. Rulon Eisenmenger at [facility]. Initially presented 03/10/2018 after a recent fall initial cranial CT scan negative. He developed progressive headaches. He was taken to [facility] or scans were obtained attempts initially made to transfer to [facility] but they could not accommodate him because the ICU was full. X-rays and imaging follow-up  at [facility] showed acute left subdural hematoma. Patient was transferred to [facility] and underwent left frontotemporal parietal craniotomy for evacuation of subdural hematoma 03/10/2018 per Dr. Tressie Stalker. Patient progressed nicely and initial therapy evaluation and ambulating 500 feet without assistive device and was discharged home 03/12/2018. Patient was readmitted same day after family reports decrease in mobility as well as speech difficulty and dysphagia. Follow-up CT/MRI reviewed, showing left frontal extra-axial hematoma.  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 No intracranial large vessel occlusion. 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 Was an incidental note of moderate right small left pleural effusions with findings of pulmonary edema with follow-up chest x-ray showing small bilateral pleural effusions and bibasilar atelectasis no change in plan of care. Noted seizure on 03/15/2018.Follow-up EEGs negative for seizure. Currently maintained on Keppra, valproic acid as well as Vimpat for seizure prophylaxis.plans to currently continue diet epileptic regimen until follow-up as outpatient with neurology.  Patient new-onset A. Fib with RVR initially placed on Cardizem drip transition to Cardizem. Troponin mildly elevated 0.04-0.15 felt to be related to demand ischemia. Echocardiogram with ejection fraction of 65%. Normal systolic function. Patient is currently NPO with nasogastric tube in place for nutritional support with follow-up modified barium swallow completed 03/20/2018 placed on a dysphagia #2 thin liquid diet. Patient with resulting functional deficits with mobility, transfers, cognition, swallowing, self-care.  Will set goals for Supervision PT/OT and Min A SLP.  See Team Conference Notes for weekly updates to the plan of care

## 2018-03-21 NOTE — Progress Notes (Signed)
Occupational Therapy Session Note  Patient Details  Name: Alvin Hudson MRN: [id number] Date of Birth: 1935/02/23  Today's Date: 03/21/2018 OT Individual Time: 1400-1430 OT Individual Time Calculation (min): 30 min    Short Term Goals: Week 1:  OT Short Term Goal 1 (Week 1): Pt will complete toilet transfer with min A OT Short Term Goal 2 (Week 1): Pt will complete LB dressing with mod A OT Short Term Goal 3 (Week 1): Pt will tolerate standing for 3 mins in preparation for BADL tasks  Skilled Therapeutic Interventions/Progress Updates:  Pt in w/c with RN attending upon arrival.  RN stated pt had gotten OOB without assistance when she arrived.  Pt indicated he wanted to use bathroom.  Pt transferred to toilet with mod A and completed toileting tasks with max A.  Pt stood at sink to wash hands.  Pt returned to bed and remained in bed with all needs within reach and bed alarm activated on middle setting. Pt issued soft call bell but unable to use appropriately.   Therapy Documentation Precautions:  Precautions Precautions: Fall Restrictions Weight Bearing Restrictions: No Pain:  Pt with no s/s of pain ADL:   Therapy/Group: Individual Therapy  Rich Brave 03/21/2018, 2:44 PM

## 2018-03-21 NOTE — Evaluation (Signed)
Speech Language Pathology Assessment and Plan  Patient Details  Name: Alvin Hudson MRN: [id number] Date of Birth: September 08, 1935  SLP Diagnosis: Aphasia;Cognitive Impairments;Dysphagia  Rehab Potential: Excellent ELOS: 14-18 days     Today's Date: 03/21/2018 SLP Individual Time: 9833-8250 SLP Individual Time Calculation (min): 60 min   Problem List:  Patient Active Problem List   Diagnosis Date Noted  . Anemia of chronic disease   . Megaloblastic anemia   . Hypoalbuminemia due to protein-calorie malnutrition ([place])   . Leukocytosis   . New onset atrial fibrillation ([place])   . Seizures ([place])   . Traumatic subdural hematoma ([place]) 03/20/2018  . History of subdural hematoma   . Dysphagia   . Atrial fibrillation ([place]) 03/17/2018  . Aphasia 03/12/2018  . Seizure ([place]) 03/12/2018  . Subdural hematoma ([place]) 03/10/2018   Past Medical History:  Past Medical History:  Diagnosis Date  . Allergy   . Seizures ([place])    Past Surgical History:  Past Surgical History:  Procedure Laterality Date  . CRANIOTOMY Left 03/10/2018   Procedure: CRANIOTOMY HEMATOMA EVACUATION SUBDURAL;  Surgeon: Newman Pies, MD;  Location: [place];  Service: Neurosurgery;  Laterality: Left;  . INGUINAL HERNIA REPAIR Bilateral     Assessment / Plan / Recommendation Clinical Impression Patient is an 83 year old right handed male with history of seizure 2 years ago maintained on Keppra followed by Dr. Philippa Chester at [facility]. History taken from chart review and wife.  Initially presented 03/10/2018 after a recent fall initial cranial CT scan negative. Patient lives with spouse. Independent prior to initial admission of 03/10/2018. Patient is very active and drives. Two level home with 2 steps to entry bedroom on main level. He developed progressive headaches. He was taken to [facility] or scans were obtained attempts initially made to transfer to [place] but they could not accommodate him  because the ICU was full. X-rays and imaging follow-up  at [facility] showed acute left subdural hematoma. Patient was transferred to [facility] and underwent left frontotemporal parietal craniotomy for evacuation of subdural hematoma 03/10/2018 per Dr. Newman Pies. Patient progressed nicely and initial therapy evaluation and ambulating 500 feet without assistive device and was discharged home 03/12/2018. Patient was readmitted same day after family reports decrease in mobility as well as speech difficulty and dysphagia. Follow-up CT/MRI reviewed, showing left frontal extra-axial hematoma.  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 No intracranial large vessel occlusion. 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 Was an incidental note of moderate right small left pleural effusions with findings of pulmonary edema with follow-up chest x-ray showing small bilateral pleural effusions and bibasilar atelectasis no change in plan of care. Noted seizure on 03/15/2018.Follow-up EEGs negative for seizure. Currently maintained on Keppra, valproic acid as well as Vimpat for seizure prophylaxis.plans to currently continue diet epileptic regimen until follow-up as outpatient with neurology.  Patient new-onset A. Fib with RVR initially placed on Cardizem drip transition to Cardizem. Troponin mildly elevated 0.04-0.15 felt to be related to demand ischemia. Echocardiogram with ejection fraction of 65%. Normal systolic function. Modified barium swallow completed 03/20/2018 placed on a dysphagia #2 thin liquid diet. Therapy evaluations completed with recommendations  of physical medicine  rehabilitation consult. Patient was admitted for a comprehensive rehabilitation program 03/20/18.  Patient demonstrates behaviors consistent with a [place] Level VII and demonstrates impairments in sustained attention, functional problem solving, emergent awareness and recall which impacts his safety with functional and familiar tasks. Patient also demonstrates a moderate-severe aphasia with receptive abilities appearing more intact than expressive. Patient answered complex yes/no questions with 80% accuracy and followed 2-step commands with 75% accuracy. Patient answered basic biographical information at the word level with moderate-severe phonemic paraphasias and neologisms noted during verbal expression with minimal awareness of errors. Patient consumed his breakfast meal of Dys. 2 textures with thin liquids via cup with moderate verbal cues needed to clear right oral pocketing and residue and delayed throat clear X 1 with thin, suspect due to mixed consistencies. Recommend patient continue current diet with full supervision. Patient would benefit from skilled SLP intervention to maximize his cognitive-linguistic and swallowing function prior to discharge.    Skilled Therapeutic Interventions          Administered a cognitive-linguistic evaluation and BSE, please see above for details. Educated patient and his wife in regards to current cognitive-linguistic and swallowing impairments and goals of skilled SLP intervention, both verbalized understanding.   SLP Assessment  Patient will need skilled Speech Lanaguage Pathology Services during CIR admission    Recommendations  SLP Diet Recommendations: Dysphagia 2 (Fine chop);Thin Liquid Administration via: Cup;No straw Medication Administration: Whole meds with puree Supervision: Patient able to self feed;Full supervision/cueing for compensatory strategies;Staff to assist with self feeding Compensations: Minimize environmental distractions;Slow rate;Small  sips/bites;Multiple dry swallows after each bite/sip Postural Changes and/or Swallow Maneuvers: Seated upright 90 degrees Oral Care Recommendations: Oral care QID Patient destination: Home Follow up Recommendations: 24 hour supervision/assistance;Home Health SLP;Outpatient SLP Equipment Recommended: None recommended by SLP    SLP Frequency 3 to 5 out of 7 days   SLP Duration  SLP Intensity  SLP Treatment/Interventions 14-18 days   Minumum of 1-2 x/day, 30 to 90 minutes  Cognitive remediation/compensation;Environmental controls;Internal/external aids;Speech/Language facilitation;Therapeutic Activities;Patient/family education;Functional tasks;Dysphagia/aspiration precaution training;Cueing hierarchy    Pain No/Denies Pain   Prior Functioning Type of Home: House  Lives With: Spouse Available Help at Discharge: Family;Available 24 hours/day Vocation: Retired  Industrial/product designer Term Goals: Week 1: SLP Short Term Goal 1 (Week 1): Patient will consume current diet with minimal overt s/s of aspiration and Mod A verbal cues for use of swallowing compensatory strategies.  SLP Short Term Goal 2 (Week 1): Patient will demonstrate sustained attention to functional tasks for ~15 minutes with Min A verbal cues for redirection.  SLP Short Term Goal 3 (Week 1): Patient will self-monitor and correct verbal errors with Mod A multimodal cues.  SLP Short Term Goal 4 (Week 1): Patient will verbalize wants/needs at the word level with Mod A multimodal cues.   Refer to Care Plan for Long Term Goals  Recommendations for other services: None   Discharge Criteria: Patient will be discharged from SLP if patient refuses treatment 3 consecutive times without medical reason, if treatment goals not met, if there is a change in medical status, if patient makes no progress towards goals or if patient is discharged from hospital.  The above assessment, treatment plan, treatment alternatives and goals were discussed and  mutually agreed upon: by patient and by family  Alvin Hudson 03/21/2018, 3:54 PM

## 2018-03-21 NOTE — Progress Notes (Signed)
Alvin Hudson PHYSICAL MEDICINE & REHABILITATION PROGRESS NOTE  Subjective/Complaints: Patient seen sitting up in bed this morning, about to work with SLP.  He slept well overnight per wife.  ROS: Limited due to cognition.  Objective: Vital Signs: Blood pressure 120/86, pulse 72, temperature 98.3 F (36.8 C), temperature source Oral, resp. rate 19, height 5\' 11"  (1.803 m), weight 65.4 kg, SpO2 99 %. Dg Chest 2 View  Result Date: 03/19/2018 CLINICAL DATA:  Pleural effusion EXAM: CHEST - 2 VIEW COMPARISON:  03/13/2018 FINDINGS: Feeding tube has been placed with its tip beyond the fundus of the stomach. Small bilateral pleural effusions of the velum with bibasilar atelectasis. No pneumothorax. Cardiomegaly. Normal vascularity. IMPRESSION: Small bilateral pleural effusions and bibasilar atelectasis. Electronically Signed   By: Jolaine Click M.D.   On: 03/19/2018 14:33   Dg Swallowing Func-speech Pathology  Result Date: 03/20/2018 Objective Swallowing Evaluation: Type of Study: MBS-Modified Barium Swallow Study  Patient Details Name: Alvin Hudson MRN: [id number] Date of Birth: [birth year] Today's Date: 03/20/2018 Time: SLP Start Time (ACUTE ONLY): 1000 -SLP Stop Time (ACUTE ONLY): 1020 SLP Time Calculation (min) (ACUTE ONLY): 20 min Past Medical History: Past Medical History: Diagnosis Date . Allergy  . Seizures (HCC)  Past Surgical History: Past Surgical History: Procedure Laterality Date . CRANIOTOMY Left 03/10/2018  Procedure: CRANIOTOMY HEMATOMA EVACUATION SUBDURAL;  Surgeon: Tressie Stalker, MD;  Location: [facility] OR;  Service: Neurosurgery;  Laterality: Left; . INGUINAL HERNIA REPAIR Bilateral  HPI: The patient is an 83 year old white male who fell, head CT negative in ED and did not seen to be admitted. Developed difficulty moving right side and aphasia 2/14, came to ED, CT revealed large acute left subdural hematoma and underwent craniotomy same day. On 2/16 pt requested to go home and pt deemed stable and was  disxcharged only to return to ED later that day due to inability to speak after waking from nap. CXR Cardiomegaly. No acute cardiopulmonary abnormality.  No data recorded Assessment / Plan / Recommendation CHL IP CLINICAL IMPRESSIONS 03/20/2018 Clinical Impression Pt demonstrated mild oropharyngeal dysphagia marked by transient and flash laryngeal penetration. Anterior bolus loss on right due to decreased containment in addition to episode of penetration close to cords before full engagement of swallow that was ejected during the swallow. Flash penetration with larger consecutive sips thin (cup and straw) observed not posing great aspiration risk during this study. During majority of swallows epiglottis was able to invert past Cortrak tube however tube appeared to prevent full deflection leaving mild-moderate vallecular residue with regular and intermittently with thin. 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 Thin liquids with cups (avoid straws for first 2-3 days), pills whole in applesauce, full supervision and check right side for pocketed food.     SLP Visit Diagnosis Dysphagia, oropharyngeal phase (R13.12) Attention and concentration deficit following -- Frontal lobe and executive function deficit following -- Impact on safety and function Mild aspiration risk;Moderate aspiration risk   CHL IP TREATMENT RECOMMENDATION 03/20/2018 Treatment Recommendations Therapy as outlined in treatment plan below   Prognosis 03/20/2018 Prognosis for Safe Diet Advancement Good Barriers to Reach Goals -- Barriers/Prognosis Comment -- CHL IP DIET RECOMMENDATION 03/20/2018 SLP Diet Recommendations Dysphagia 2 (Fine chop) solids;Thin liquid Liquid Administration via Cup;No straw Medication Administration Whole meds with puree Compensations Minimize environmental distractions;Slow rate;Small sips/bites;Multiple dry  swallows after each bite/sip Postural Changes Seated upright at 90 degrees   CHL  IP OTHER RECOMMENDATIONS 03/20/2018 Recommended Consults -- Oral Care Recommendations Oral care BID Other Recommendations --   CHL IP FOLLOW UP RECOMMENDATIONS 03/20/2018 Follow up Recommendations Inpatient Rehab   CHL IP FREQUENCY AND DURATION 03/20/2018 Speech Therapy Frequency (ACUTE ONLY) min 2x/week Treatment Duration 2 weeks      CHL IP ORAL PHASE 03/20/2018 Oral Phase Impaired Oral - Pudding Teaspoon -- Oral - Pudding Cup -- Oral - Honey Teaspoon -- Oral - Honey Cup WFL Oral - Nectar Teaspoon WFL Oral - Nectar Cup WFL Oral - Nectar Straw -- Oral - Thin Teaspoon Weak lingual manipulation;WFL Oral - Thin Cup Right anterior bolus loss Oral - Thin Straw WFL Oral - Puree WFL Oral - Mech Soft -- Oral - Regular Reduced posterior propulsion Oral - Multi-Consistency -- Oral - Pill -- Oral Phase - Comment --  CHL IP PHARYNGEAL PHASE 03/20/2018 Pharyngeal Phase Impaired Pharyngeal- Pudding Teaspoon -- Pharyngeal -- Pharyngeal- Pudding Cup -- Pharyngeal -- Pharyngeal- Honey Teaspoon -- Pharyngeal -- Pharyngeal- Honey Cup Pharyngeal residue - valleculae Pharyngeal -- Pharyngeal- Nectar Teaspoon Pharyngeal residue - valleculae Pharyngeal -- Pharyngeal- Nectar Cup Penetration/Aspiration during swallow;Lateral channel residue Pharyngeal Material enters airway, remains ABOVE vocal cords then ejected out Pharyngeal- Nectar Straw -- Pharyngeal -- Pharyngeal- Thin Teaspoon Pharyngeal residue - valleculae;Pharyngeal residue - pyriform Pharyngeal Material does not enter airway Pharyngeal- Thin Cup Penetration/Aspiration during swallow Pharyngeal Material enters airway, CONTACTS cords and then ejected out Pharyngeal- Thin Straw Pharyngeal residue - valleculae Pharyngeal Material does not enter airway Pharyngeal- Puree Pharyngeal residue - valleculae Pharyngeal -- Pharyngeal- Mechanical Soft -- Pharyngeal -- Pharyngeal- Regular Pharyngeal residue -  valleculae Pharyngeal -- Pharyngeal- Multi-consistency -- Pharyngeal -- Pharyngeal- Pill -- Pharyngeal -- Pharyngeal Comment --  CHL IP CERVICAL ESOPHAGEAL PHASE 03/20/2018 Cervical Esophageal Phase Impaired Pudding Teaspoon -- Pudding Cup -- Honey Teaspoon -- Honey Cup -- Nectar Teaspoon -- Nectar Cup -- Nectar Straw -- Thin Teaspoon -- Thin Cup -- Thin Straw -- Puree -- Mechanical Soft -- Regular -- Multi-consistency -- Pill -- Cervical Esophageal Comment cricopharyngeal bar [name] 03/20/2018, 1:44 PM [name] Litaker M.Ed CCC-SLP Speech-Language Pathologist Pager [phone] Office [phone]              Recent Labs    03/20/18 0542 03/21/18 0705  WBC 9.9 12.3*  HGB 11.8* 12.3*  HCT 35.7* 37.7*  PLT 371 420*   Recent Labs    03/20/18 0542 03/21/18 0705  NA 135 136  K 4.1 4.3  CL 98 99  CO2 29 27  GLUCOSE 114* 125*  BUN 10 10  CREATININE 0.69 0.76  CALCIUM 9.0 9.2    Physical Exam: BP 120/86 (BP Location: Left Arm)   Pulse 72   Temp 98.3 F (36.8 C) (Oral)   Resp 19   Ht 5\' 11"  (1.803 m)   Wt 65.4 kg   SpO2 99%   BMI 20.11 kg/m  Constitutional: He appears well-developed and well-nourished.  NAD. HENT: Craniotomy site clean and dry with staples intact. +NG  Eyes: EOMI.  No discharge. Cardiovascular: Irregularly irregular.  No JVD.  Respiratory: Effort normal and breath sounds normal.  GI: Soft. Bowel sounds are normal.  Musculoskeletal: No edema or tenderness in extremities  Neurological: He is alert.  Global, expressive > receptive aphasia.  Motor: Limited due to participation, however moving all extremities spontaneously. Skin: See above  Psychiatric: Unable to assess due to mentation   Assessment/Plan: 1. Functional deficits secondary to left SDH which require 3+ hours per  day of interdisciplinary therapy in a comprehensive inpatient rehab setting.  Physiatrist is providing close team supervision and 24 hour management of active medical problems  listed below.  Physiatrist and rehab team continue to assess barriers to discharge/monitor patient progress toward functional and medical goals  Care Tool:  Bathing              Bathing assist       Upper Body Dressing/Undressing Upper body dressing        Upper body assist      Lower Body Dressing/Undressing Lower body dressing            Lower body assist       Toileting Toileting    Toileting assist       Transfers Chair/bed transfer  Transfers assist           Locomotion Ambulation   Ambulation assist              Walk 10 feet activity   Assist           Walk 50 feet activity   Assist           Walk 150 feet activity   Assist           Walk 10 feet on uneven surface  activity   Assist           Wheelchair     Assist               Wheelchair 50 feet with 2 turns activity    Assist            Wheelchair 150 feet activity     Assist            Medical Problem List and Plan: 1.  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 Status post left frontotemporal parietal craniotomy evacuation of subdural hematoma 03/10/2018  Begin CIR 2.  Antithrombotics: -DVT/anticoagulation:  SCDs. No anticoagulation due to subdural hematoma             -antiplatelet therapy: Not applicable 3. Pain Management:  Hydrocodone as needed 4. Mood:  Zoloft 100 mg daily, Restoril 15 mg daily at bedtime             -antipsychotic agents: not applicable 5. Neuropsych: This patient is not capable of making decisions on his own behalf. 6. Skin/Wound Care:  Routine skin checks 7. Fluids/Electrolytes/Nutrition:  Routine in and out's  BMP within acceptable range on 2/25 8. Seizure disorder. Keppra 1500 mg twice a day, Vimpat 200 mg twice a day, valproic acid 500 mg every 8 hours. Plan to continue this regimen to follow up outpatient with neurology services. 9. New-onset atrial  fibrillation with RVR. Cardizem initiated 60 mg every 6 hours 03/16/2018. Cardiac rate control. No anticoagulation due to subdural hematoma. Follow-up cardiology services Dr. Garnette Scheuermann 10. Dysphagia. Dysphagia #2 thin liquids.   Follow-up modified barium swallow in the future  Advance diet as tolerated 11.  Leukocytosis  WBCs 12.3 on 2/25  Afebrile  Continue to monitor 12.  Hypoalbuminemia  Supplement initiated on 2/25 13.  Anemia of chronic disease  Hemoglobin 12.3 on 2/25  Will check vitamin B12/folate on next lab  LOS: 1 days A FACE TO FACE EVALUATION WAS PERFORMED  Ankit Karis Juba 03/21/2018, 9:32 AM

## 2018-03-22 ENCOUNTER — Inpatient Hospital Stay (HOSPITAL_COMMUNITY): Payer: Medicare Other

## 2018-03-22 ENCOUNTER — Inpatient Hospital Stay (HOSPITAL_COMMUNITY): Payer: Medicare Other | Admitting: Occupational Therapy

## 2018-03-22 ENCOUNTER — Inpatient Hospital Stay (HOSPITAL_COMMUNITY): Payer: Medicare Other | Admitting: Speech Pathology

## 2018-03-22 ENCOUNTER — Inpatient Hospital Stay (HOSPITAL_COMMUNITY): Payer: Medicare Other | Admitting: Physical Therapy

## 2018-03-22 NOTE — Progress Notes (Signed)
Occupational Therapy Session Note  Patient Details  Name: Alvin Hudson MRN: [id number] Date of Birth: Nov 14, 1935  Today's Date: 03/22/2018 OT Individual Time: 2878-6767 OT Individual Time Calculation (min): 58 min    Short Term Goals: Week 1:  OT Short Term Goal 1 (Week 1): Pt will complete toilet transfer with min A OT Short Term Goal 2 (Week 1): Pt will complete LB dressing with mod A OT Short Term Goal 3 (Week 1): Pt will tolerate standing for 3 mins in preparation for BADL tasks  Skilled Therapeutic Interventions/Progress Updates:    1;1. Pt given red foam handle as NT reporting dificulty with controlling utensil. Pt no c/o pain. Pt completes shower transfer with grab bar and min A pt bathes with increased time and min A for sit to stand to wash peri area and buttocks. Pt perseverates on bathing face and head, requiring VC for sequencing and R attention as pt demo many grip slips on wash cloth that he is unaware of. Pt completes UB dressing with A for shirt orientation as well as 10% A to keep RUE from falling out of sleeve when putting overhead. Pt dons pants with steady A to advance pants past hips after threading BLE into pants, however require A for brief. Pt able to [place] B socks with min A. Exited session with pt seated in w/c, belt alarm on, clal light in reach and wife in room ready for next PT session  Therapy Documentation Precautions:  Precautions Precautions: Fall Restrictions Weight Bearing Restrictions: No General:   Vital Signs:   Pain: Pain Assessment Pain Score: 0-No pain ADL:   Therapy/Group: Individual Therapy  Shon Hale 03/22/2018, 2:02 PM

## 2018-03-22 NOTE — Progress Notes (Signed)
Speech Language Pathology Daily Session Note  Patient Details  Name: Alvin Hudson MRN: [id number] Date of Birth: 18-Jun-1935  Today's Date: 03/22/2018 SLP Individual Time: 1120-1150 SLP Individual Time Calculation (min): 30 min  Short Term Goals: Week 1: SLP Short Term Goal 1 (Week 1): Patient will consume current diet with minimal overt s/s of aspiration and Mod A verbal cues for use of swallowing compensatory strategies.  SLP Short Term Goal 2 (Week 1): Patient will demonstrate sustained attention to functional tasks for ~15 minutes with Min A verbal cues for redirection.  SLP Short Term Goal 3 (Week 1): Patient will self-monitor and correct verbal errors with Mod A multimodal cues.  SLP Short Term Goal 4 (Week 1): Patient will verbalize wants/needs at the word level with Mod A multimodal cues.   Skilled Therapeutic Interventions:  Skilled treatment session focused on dysphagia and communication goals. SLP facilitated session by providing skilled observation of pt consuming graham crackers. Pt required Mod A physical assistance to self-feed and consumed large bites as a result of motoric difficulty with RUE. However pt with functional mastication but would recommend further trials of solid textures that Jalin't dissolve (graham likely dissolved as he masticated it). Additionally, pt consumed thin liquids via straw with no overt s/s of aspiration. SLP further facilitiated session by providing Total A cues to count with no attempt by pt to vocalize/verbalize counting. Pt left upright in wheelchair, lap alarm on, telesitter present and all needs within reach. Continue per current plan of care.      Pain Pain Assessment Pain Scale: Faces Pain Score: 0-No pain Faces Pain Scale: No hurt  Therapy/Group: Individual Therapy  Karlye Ihrig 03/22/2018, 2:35 PM

## 2018-03-22 NOTE — Progress Notes (Signed)
Occupational Therapy Session Note  Patient Details  Name: Alvin Hudson MRN: [id number] Date of Birth: 1935/04/27  Today's Date: 03/22/2018 OT Individual Time: 2902-1115 OT Individual Time Calculation (min): 30 min    Short Term Goals: Week 1:  OT Short Term Goal 1 (Week 1): Pt will complete toilet transfer with min A OT Short Term Goal 2 (Week 1): Pt will complete LB dressing with mod A OT Short Term Goal 3 (Week 1): Pt will tolerate standing for 3 mins in preparation for BADL tasks  Skilled Therapeutic Interventions/Progress Updates:    Pt received in bed sleeping but was able to wake with encouragement. Pt sat to EOB with min A and then donned clothing with mod A due to R inattention and apraxia.  He demonstrated severely impaired apraxia with the initial transfer to w/c as he was trying to sit down halfway before turning and was not responding to cues and then he needed max A with transfer. The following transfers pt was able to do with min A.   Pt grabbed his brief and started vocalizing words that were unclear.  Took pt to bathroom and he his brief was wet. Got pt on toilet with min A and changed out his brief.   Pt sat at sink to brush teeth. He was unable to manage toothbrush in his R hand even with HOH A.  Pt used L hand and was able to complete oral care with min A. Pt resting in w/c with belt alarm on and family in room with patient.  Therapy Documentation Precautions:  Precautions Precautions: Fall Restrictions Weight Bearing Restrictions: No       Pain: Pain Assessment Pain Score: 0-No pain   Therapy/Group: Individual Therapy  SAGUIER,JULIA 03/22/2018, 1:11 PM

## 2018-03-22 NOTE — Progress Notes (Addendum)
Physical Therapy Session Note  Patient Details  Name: Alvin Hudson MRN: [id number] Date of Birth: Mar 28, 1935  Today's Date: 03/22/2018 PT Individual Time: 2620-3559 PT Individual Time Calculation (min): 42 min   Short Term Goals: Week 1:  PT Short Term Goal 1 (Week 1): pt will perform functional transfers with min A PT Short Term Goal 2 (Week 1): pt will attend to therapeutic task x 2 minutes with min A  Skilled Therapeutic Interventions/Progress Updates:  Pt received in w/c & agreeable to tx. Pt reports "I feel frustrated" but unable to elaborate. Transported pt to/from gym via w/c dependent assist for time management. Pt transfers sit>stand with min assist, stand>sit with mod assist 2/2 decreased safety awareness. Pt ambulates 45 ft + 45 ft with RW & min assist with significant assistance for management of AD and little assistance for balance. Pt demonstrates step-to pattern leading with RLE and inability to ambulate within base of AD. Pt turns L with increased ease compared to decreased step length when turning R. Pt ambulates 15 ft x 2 with RUE HHA min assist with shuffled gait and forward flexed posture, as was present during gait with RW. Pt utilizes dynavision in standing with min assist and pt holding onto board with 1UE for increased support despite therapist attempts to educate him to not hold. Pt requires hand over hand at times to accurately press red lights with RUE as well as cuing to press red lights vs un lit lights. Pt with great difficulty following one step commands throughout session and frequently attempts to return to sitting in w/c. At end of session pt left sitting in w/c with chair alarm donned, call bell in reach, wife present in room, and telesitter in room.   Pain: pt holding head one time during session but denying c/o pain.   Therapy Documentation Precautions:  Precautions Precautions: Fall Restrictions Weight Bearing Restrictions: No    Therapy/Group: Individual  Therapy  Sandi Mariscal 03/22/2018, 3:15 PM

## 2018-03-22 NOTE — Plan of Care (Signed)
  Problem: RH BOWEL ELIMINATION Goal: RH STG MANAGE BOWEL WITH ASSISTANCE Description STG Manage Bowel with Assistance. Mod  Outcome: Progressing   Problem: RH BLADDER ELIMINATION Goal: RH STG MANAGE BLADDER WITH ASSISTANCE Description STG Manage Bladder With Assistance. Mod  Outcome: Progressing   Problem: RH SKIN INTEGRITY Goal: RH STG SKIN FREE OF INFECTION/BREAKDOWN Description Free of breakdown, infection while on rehab mod assist.   Outcome: Progressing   Problem: RH SAFETY Goal: RH STG ADHERE TO SAFETY PRECAUTIONS W/ASSISTANCE/DEVICE Description STG Adhere to Safety Precautions With Assistance/Device.Mod  Outcome: Progressing

## 2018-03-22 NOTE — Progress Notes (Signed)
Patient slept fairly well throughout the night, awakening in early morning hours. Wife at bedside. Telesitter monitoring continues, brief restlessness noted early this am. Patient able to be redirected without difficulty. No complaints noted throughout the shift. No distress noted.

## 2018-03-22 NOTE — Progress Notes (Signed)
Speech Language Pathology Daily Session Note  Patient Details  Name: Alvin Hudson MRN: [id number] Date of Birth: Sep 21, 1935  Today's Date: 03/22/2018 SLP Individual Time: 0815-0900 SLP Individual Time Calculation (min): 45 min  Short Term Goals: Week 1: SLP Short Term Goal 1 (Week 1): Patient will consume current diet with minimal overt s/s of aspiration and Mod A verbal cues for use of swallowing compensatory strategies.  SLP Short Term Goal 2 (Week 1): Patient will demonstrate sustained attention to functional tasks for ~15 minutes with Min A verbal cues for redirection.  SLP Short Term Goal 3 (Week 1): Patient will self-monitor and correct verbal errors with Mod A multimodal cues.  SLP Short Term Goal 4 (Week 1): Patient will verbalize wants/needs at the word level with Mod A multimodal cues.   Skilled Therapeutic Interventions:Skilled treatment session focused on communication goals. Upon arrival, patient was awake while upright in the bed and indicated he needed to use the bathroom. Patient was able to scoot EOB and utilized the urinal with set-up assist. Patient was continent of urine but also had a soiled brief. Patient assisted with self-care with Min A verbal cues. SLP also facilitated session by providing Mod-Max A multimodal cues for patient to self-monitor and correct verbal errors while reading at the word and phrase level. Patient matched a written word to an object from a field of 2 with 100% accuracy and matched the written function to an object from field of 2 with 50% accuracy. Patient left upright in bed with alarm on and all needs within reach. Continue with current plan of care.      Pain Pain Assessment Pain Scale: Faces Pain Score: 0-No pain Faces Pain Scale: No hurt  Therapy/Group: Individual Therapy  Sierra Bissonette 03/22/2018, 3:41 PM

## 2018-03-22 NOTE — Progress Notes (Signed)
[facility] PHYSICAL MEDICINE & REHABILITATION PROGRESS NOTE  Subjective/Complaints: Patient seen sitting up in bed this morning.  He slept well overnight per wife.  He appears to indicate that he feels better without his NG tube.  ROS: Limited due to cognition.  Objective: Vital Signs: Blood pressure 124/78, pulse 70, temperature 97.8 F (36.6 C), resp. rate 17, height 5\' 11"  (1.803 m), weight 61.8 kg, SpO2 99 %. No results found. Recent Labs    03/20/18 0542 03/21/18 0705  WBC 9.9 12.3*  HGB 11.8* 12.3*  HCT 35.7* 37.7*  PLT 371 420*   Recent Labs    03/20/18 0542 03/21/18 0705  NA 135 136  K 4.1 4.3  CL 98 99  CO2 29 27  GLUCOSE 114* 125*  BUN 10 10  CREATININE 0.69 0.76  CALCIUM 9.0 9.2    Physical Exam: BP 124/78   Pulse 70   Temp 97.8 F (36.6 C)   Resp 17   Ht 5\' 11"  (1.803 m)   Wt 61.8 kg   SpO2 99%   BMI 19.00 kg/m  Constitutional: He appears well-developed and well-nourished.  NAD. HENT: Craniotomy site clean and dry with staples intact.   Eyes: EOMI.  No discharge. Cardiovascular: Irregularly irregular.  No JVD.  Respiratory: Effort normal and breath sounds normal.  GI: Soft. Bowel sounds are normal.  Musculoskeletal: No edema or tenderness in extremities  Neurological: He is alert.  Global, expressive > receptive aphasia.  Motor: Limited due to participation, however moving all extremities spontaneously, unchanged. Skin: See above  Psychiatric: Unable to assess due to mentation   Assessment/Plan: 1. Functional deficits secondary to left SDH which require 3+ hours per day of interdisciplinary therapy in a comprehensive inpatient rehab setting.  Physiatrist is providing close team supervision and 24 hour management of active medical problems listed below.  Physiatrist and rehab team continue to assess barriers to discharge/monitor patient progress toward functional and medical goals  Care Tool:  Bathing  Bathing activity did not occur:  (did not occur.) Body parts bathed by patient: Right arm, Chest, Abdomen, Front perineal area, Buttocks, Right upper leg, Left upper leg, Face   Body parts bathed by helper: Right lower leg, Left lower leg, Left arm     Bathing assist Assist Level: Moderate Assistance - Patient 50 - 74%     Upper Body Dressing/Undressing Upper body dressing Upper body dressing/undressing activity did not occur (including orthotics): (did not occur; pt was already in bed.) What is the patient wearing?: Pull over shirt    Upper body assist Assist Level: Moderate Assistance - Patient 50 - 74%    Lower Body Dressing/Undressing Lower body dressing    Lower body dressing activity did not occur: (did not occur.) What is the patient wearing?: Incontinence brief     Lower body assist Assist for lower body dressing: Maximal Assistance - Patient 25 - 49%     Toileting Toileting Toileting Activity did not occur (Clothing management and hygiene only): N/A (no void or bm)  Toileting assist Assist for toileting: Maximal Assistance - Patient 25 - 49%     Transfers Chair/bed transfer  Transfers assist  Chair/bed transfer activity did not occur: (did not occur.)  Chair/bed transfer assist level: Minimal Assistance - Patient > 75%     Locomotion Ambulation   Ambulation assist      Assist level: Moderate Assistance - Patient 50 - 74%   Max distance: 10   Walk 10 feet activity   Assist  Assist level: Moderate Assistance - Patient - 50 - 74%     Walk 50 feet activity   Assist Walk 50 feet with 2 turns activity did not occur: Safety/medical concerns         Walk 150 feet activity   Assist Walk 150 feet activity did not occur: Safety/medical concerns         Walk 10 feet on uneven surface  activity   Assist Walk 10 feet on uneven surfaces activity did not occur: Safety/medical concerns         Wheelchair     Assist   Type of Wheelchair: Manual    Wheelchair  assist level: Minimal Assistance - Patient > 75% Max wheelchair distance: 25    Wheelchair 50 feet with 2 turns activity    Assist    Wheelchair 50 feet with 2 turns activity did not occur: Safety/medical concerns       Wheelchair 150 feet activity     Assist Wheelchair 150 feet activity did not occur: Safety/medical concerns          Medical Problem List and Plan: 1.  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 Status post left frontotemporal parietal craniotomy evacuation of subdural hematoma 03/10/2018  Continue CIR 2.  Antithrombotics: -DVT/anticoagulation:  SCDs. No anticoagulation due to subdural hematoma             -antiplatelet therapy: Not applicable 3. Pain Management:  Hydrocodone as needed 4. Mood:  Zoloft 100 mg daily, Restoril 15 mg daily at bedtime             -antipsychotic agents: not applicable 5. Neuropsych: This patient is not capable of making decisions on his own behalf. 6. Skin/Wound Care:  Routine skin checks 7. Fluids/Electrolytes/Nutrition:  Routine in and out's  BMP within acceptable range on 2/25 8. Seizure disorder. Keppra 1500 mg twice a day, Vimpat 200 mg twice a day, valproic acid 500 mg every 8 hours. Plan to continue this regimen to follow up outpatient with neurology services. 9. New-onset atrial fibrillation with RVR. Cardizem initiated 60 mg every 6 hours 03/16/2018. Cardiac rate control. No anticoagulation due to subdural hematoma. Follow-up cardiology services Dr. Garnette Scheuermann 10. Dysphagia. Dysphagia #2 thin liquids.   Follow-up modified barium swallow in the future  Advance diet as tolerated 11.  Leukocytosis  WBCs 12.3 on 2/25  Will order labs for the end of the week  Afebrile  Continue to monitor 12.  Hypoalbuminemia  Supplement initiated on 2/25 13.  Anemia of chronic disease  Hemoglobin 12.3 on 2/25  Will check vitamin B12/folate on next lab  Will order labs for the end of the  week  LOS: 2 days A FACE TO FACE EVALUATION WAS PERFORMED  Kayli Beal Karis Juba 03/22/2018, 10:52 AM

## 2018-03-23 ENCOUNTER — Inpatient Hospital Stay (HOSPITAL_COMMUNITY): Payer: Medicare Other | Admitting: Physical Therapy

## 2018-03-23 ENCOUNTER — Inpatient Hospital Stay (HOSPITAL_COMMUNITY): Payer: Medicare Other | Admitting: Occupational Therapy

## 2018-03-23 ENCOUNTER — Inpatient Hospital Stay (HOSPITAL_COMMUNITY): Payer: Medicare Other | Admitting: Speech Pathology

## 2018-03-23 MED ORDER — POLYETHYLENE GLYCOL 3350 17 G PO PACK
17.0000 g | PACK | Freq: Every day | ORAL | Status: DC
  Administered 2018-03-23 – 2018-04-11 (×17): 17 g via ORAL
  Filled 2018-03-23 (×17): qty 1

## 2018-03-23 MED ORDER — SENNA 8.6 MG PO TABS
1.0000 | ORAL_TABLET | Freq: Two times a day (BID) | ORAL | Status: DC
  Administered 2018-03-23 – 2018-03-29 (×13): 8.6 mg via ORAL
  Filled 2018-03-23 (×13): qty 1

## 2018-03-23 NOTE — Progress Notes (Signed)
Speech Language Pathology Daily Session Note  Patient Details  Name: Alvin Hudson MRN: [id number] Date of Birth: 12-Jun-1935  Today's Date: 03/23/2018 SLP Individual Time: 0700-0800 SLP Individual Time Calculation (min): 60 min  Short Term Goals: Week 1: SLP Short Term Goal 1 (Week 1): Patient will consume current diet with minimal overt s/s of aspiration and Mod A verbal cues for use of swallowing compensatory strategies.  SLP Short Term Goal 2 (Week 1): Patient will demonstrate sustained attention to functional tasks for ~15 minutes with Min A verbal cues for redirection.  SLP Short Term Goal 3 (Week 1): Patient will self-monitor and correct verbal errors with Mod A multimodal cues.  SLP Short Term Goal 4 (Week 1): Patient will verbalize wants/needs at the word level with Mod A multimodal cues.   Skilled Therapeutic Interventions: Skilled treatment session focused on cognitive-linguistic and dysphagia goals. SLP facilitated session by providing skilled observation with breakfast meal of Dys. 2 textures with thin liquids. Patient consumed meal without overt s/s of aspiration but required Mod A multimodal cues for self-feeding and mild oral residue removed at end of meal via the suction toothbrush. SLP also facilitated session by providing supervision verbal cues for word-finding while naming functional items with 100% accuracy. Patient also verbalized the function of the item at the simple phrase level with Min A verbal cues and extra time. Patient's verbal expression improved when cued to "slow down." Patient also demonstrated increased ability to communicate basic and functional information. Patient left upright in bed with alarm on and all needs within reach. Continue with current plan of care.      Pain Pain Assessment Pain Scale: Faces Faces Pain Scale: No hurt  Therapy/Group: Individual Therapy  Videl Nobrega 03/23/2018, 12:31 PM

## 2018-03-23 NOTE — Plan of Care (Signed)
  Problem: RH BOWEL ELIMINATION Goal: RH STG MANAGE BOWEL WITH ASSISTANCE Description STG Manage Bowel with Assistance. Mod  Outcome: Progressing   Problem: RH BLADDER ELIMINATION Goal: RH STG MANAGE BLADDER WITH ASSISTANCE Description STG Manage Bladder With Assistance. Mod  Outcome: Progressing   Problem: RH SKIN INTEGRITY Goal: RH STG SKIN FREE OF INFECTION/BREAKDOWN Description Free of breakdown, infection while on rehab mod assist.   Outcome: Progressing   Problem: RH SAFETY Goal: RH STG ADHERE TO SAFETY PRECAUTIONS W/ASSISTANCE/DEVICE Description STG Adhere to Safety Precautions With Assistance/Device.Mod  Outcome: Progressing   Problem: RH COGNITION-NURSING Goal: RH STG USES MEMORY AIDS/STRATEGIES W/ASSIST TO PROBLEM SOLVE Description STG Uses Memory Aids/Strategies With Assistance to Problem Solve. Mod  Outcome: Progressing   Problem: RH KNOWLEDGE DEFICIT BRAIN INJURY Goal: RH STG INCREASE KNOWLEDGE OF SELF CARE AFTER BRAIN INJURY Description Patient and family will be able to describe care of patient following BI with cues, handouts  Outcome: Progressing   Problem: Consults Goal: RH BRAIN INJURY PATIENT EDUCATION Description Description: See Patient Education module for eduction specifics Outcome: Progressing   

## 2018-03-23 NOTE — Progress Notes (Signed)
Physical Therapy Session Note  Patient Details  Name: Ludger Dargenio MRN: [id number] Date of Birth: Apr 05, 1935  Today's Date: 03/23/2018 PT Individual Time: 0929-1029 PT Individual Time Calculation (min): 60 min   Short Term Goals: Week 1:  PT Short Term Goal 1 (Week 1): pt will perform functional transfers with min A PT Short Term Goal 2 (Week 1): pt will attend to therapeutic task x 2 minutes with min A  Skilled Therapeutic Interventions/Progress Updates:    pt rec'd in chair, fatigued but agreeable to therapy. Pt performs gait with RW 100' x 3 throughout session with frequent standing rest breaks due to fatigue, min A.  Furniture transfers and gait on carpet for functional mobility training all with min A.  Ramp negotiaiton with min A to ascend ramp with RW, mod A to descend due to decreased eccentric control.  Pt requires mod/max cuing for navigating tight spaces and turns with RW.  Seated coloring task with pt requiring min hand over hand assist to pull caps from markers and to place markers back in box.  Seated card sorting task with pt requiring intermittent min A to grasp and release cards with Rt UE, pt able to sort 4 colors with 100% accuracy. Pt left in bed with needs at hand, alarm set.  Therapy Documentation Precautions:  Precautions Precautions: Fall Restrictions Weight Bearing Restrictions: No Pain:  no signs/symptoms of pain   Therapy/Group: Individual Therapy  Elisse Pennick 03/23/2018, 10:30 AM

## 2018-03-23 NOTE — Progress Notes (Signed)
[facility] PHYSICAL MEDICINE & REHABILITATION PROGRESS NOTE  Subjective/Complaints: Patient seen sitting up in bed this morning, working with SLP.  He indicates he slept well overnight.  His ability to follow commands is improving.  ROS: Limited due to cognition.  Objective: Vital Signs: Blood pressure 130/76, pulse 82, temperature 97.6 F (36.4 C), resp. rate 16, height 5\' 11"  (1.803 m), weight 63.2 kg, SpO2 99 %. No results found. Recent Labs    03/21/18 0705  WBC 12.3*  HGB 12.3*  HCT 37.7*  PLT 420*   Recent Labs    03/21/18 0705  NA 136  K 4.3  CL 99  CO2 27  GLUCOSE 125*  BUN 10  CREATININE 0.76  CALCIUM 9.2    Physical Exam: BP 130/76   Pulse 82   Temp 97.6 F (36.4 C)   Resp 16   Ht 5\' 11"  (1.803 m)   Wt 63.2 kg   SpO2 99%   BMI 19.43 kg/m  Constitutional: He appears well-developed and well-nourished.  NAD. HENT: Craniotomy site clean and dry with staples intact.   Eyes: EOMI.  No discharge. Cardiovascular: Irregularly irregular.  No JVD.  Respiratory: Effort normal and breath sounds normal.  GI: Soft. Bowel sounds are normal.  Musculoskeletal: No edema or tenderness in extremities  Neurological: He is alert.  Global, expressive >> receptive aphasia (receptive aphasia improving).  Motor: Grossly 4-4+/5 throughout Skin: See above  Psychiatric: Unable to assess due to mentation   Assessment/Plan: 1. Functional deficits secondary to left SDH which require 3+ hours per day of interdisciplinary therapy in a comprehensive inpatient rehab setting.  Physiatrist is providing close team supervision and 24 hour management of active medical problems listed below.  Physiatrist and rehab team continue to assess barriers to discharge/monitor patient progress toward functional and medical goals  Care Tool:  Bathing  Bathing activity did not occur: (did not occur.) Body parts bathed by patient: Right arm, Chest, Abdomen, Front perineal area, Buttocks, Right  upper leg, Left upper leg, Face, Left arm   Body parts bathed by helper: Right lower leg, Left lower leg, Left arm     Bathing assist Assist Level: Minimal Assistance - Patient > 75%     Upper Body Dressing/Undressing Upper body dressing Upper body dressing/undressing activity did not occur (including orthotics): (did not occur; pt was already in bed.) What is the patient wearing?: Pull over shirt    Upper body assist Assist Level: Minimal Assistance - Patient > 75%    Lower Body Dressing/Undressing Lower body dressing    Lower body dressing activity did not occur: (did not occur.) What is the patient wearing?: Incontinence brief, Pants     Lower body assist Assist for lower body dressing: Moderate Assistance - Patient 50 - 74%     Toileting Toileting Toileting Activity did not occur (Clothing management and hygiene only): N/A (no void or bm)  Toileting assist Assist for toileting: Moderate Assistance - Patient 50 - 74%     Transfers Chair/bed transfer  Transfers assist  Chair/bed transfer activity did not occur: (did not occur.)  Chair/bed transfer assist level: Minimal Assistance - Patient > 75%     Locomotion Ambulation   Ambulation assist      Assist level: Minimal Assistance - Patient > 75% Assistive device: Walker-rolling Max distance: 45 ft   Walk 10 feet activity   Assist     Assist level: Minimal Assistance - Patient > 75% Assistive device: Walker-rolling   Walk 50 feet  activity   Assist Walk 50 feet with 2 turns activity did not occur: Safety/medical concerns         Walk 150 feet activity   Assist Walk 150 feet activity did not occur: Safety/medical concerns         Walk 10 feet on uneven surface  activity   Assist Walk 10 feet on uneven surfaces activity did not occur: Safety/medical concerns         Wheelchair     Assist   Type of Wheelchair: Manual    Wheelchair assist level: Minimal Assistance - Patient >  75% Max wheelchair distance: 25    Wheelchair 50 feet with 2 turns activity    Assist    Wheelchair 50 feet with 2 turns activity did not occur: Safety/medical concerns       Wheelchair 150 feet activity     Assist Wheelchair 150 feet activity did not occur: Safety/medical concerns          Medical Problem List and Plan: 1.  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 Status post left frontotemporal parietal craniotomy evacuation of subdural hematoma 03/10/2018  Continue CIR 2.  Antithrombotics: -DVT/anticoagulation:  SCDs. No anticoagulation due to subdural hematoma             -antiplatelet therapy: Not applicable 3. Pain Management:  Hydrocodone as needed 4. Mood:  Zoloft 100 mg daily, Restoril 15 mg daily at bedtime             -antipsychotic agents: not applicable 5. Neuropsych: This patient is not capable of making decisions on his own behalf. 6. Skin/Wound Care:  Routine skin checks 7. Fluids/Electrolytes/Nutrition:  Routine in and out's  BMP within acceptable range on 2/25 8. Seizure disorder. Keppra 1500 mg twice a day, Vimpat 200 mg twice a day, valproic acid 500 mg every 8 hours. Plan to continue this regimen to follow up outpatient with neurology services. 9. New-onset atrial fibrillation with RVR. Cardizem initiated 60 mg every 6 hours 03/16/2018. Cardiac rate control. No anticoagulation due to subdural hematoma. Follow-up cardiology services Dr. Garnette Scheuermann 10. Dysphagia. Dysphagia #2 thin liquids.   Follow-up modified barium swallow in the future  Advance diet as tolerated 11.  Leukocytosis  WBCs 12.3 on 2/25  Labs ordered for tomorrow  Afebrile  Continue to monitor 12.  Hypoalbuminemia  Supplement initiated on 2/25 13.  Anemia of chronic disease  Hemoglobin 12.3 on 2/25  Will check vitamin B12/folate on next lab  Labs ordered for tomorrow  LOS: 3 days A FACE TO FACE EVALUATION WAS PERFORMED   Karis Juba 03/23/2018, 10:43 AM

## 2018-03-23 NOTE — Progress Notes (Signed)
Occupational Therapy Session Note  Patient Details  Name: Alvin Hudson MRN: [id number] Date of Birth: [birth year]  Today's Date: 03/23/2018 OT Individual Time: 6431-4276 OT Individual Time Calculation (min): 60 min   Short Term Goals: Week 1:  OT Short Term Goal 1 (Week 1): Pt will complete toilet transfer with min A OT Short Term Goal 2 (Week 1): Pt will complete LB dressing with mod A OT Short Term Goal 3 (Week 1): Pt will tolerate standing for 3 mins in preparation for BADL tasks  Skilled Therapeutic Interventions/Progress Updates:    Pt greeted semi-reclined in bed and agreeable to OT treatment session. Pt came to sitting EOB with verbal cues and increased time. Pt reaching for urinal, but OT had pt transfer to the commode with min A stand-pivot using grab bars. Pt needed verbal cues not to sit before brief removed with assistance from OT. Pt voided bladder continently. Pt declined to shower despite encouragement. Bathing/dressing completed from wc at the sink with verbal cues for hand placement and min A for sit<>stand. Pt with some difficulty comprehending directions from OT requiring demonstration to initiate some bathing tasks. Pt able to thread BLEs into pants today with supervision and verbal cues to initiate each leg. Standing balance and forced use of R UE to pull up pants-min A for dynamic balance. Toothbrushing task completed seated at the sink with improved ability to sequence activity today. Pt also initiated use of R hand to brush teeth.  Pt left seated in wc at end of session with alarm belt on and needs met.   Therapy Documentation Precautions:  Precautions Precautions: Fall Restrictions Weight Bearing Restrictions: No Pain:   none/denies pain Other Treatments:    Therapy/Group: Individual Therapy  Valma Cava 03/23/2018, 9:15 AM

## 2018-03-23 NOTE — Plan of Care (Signed)
  Problem: RH SKIN INTEGRITY Goal: RH STG SKIN FREE OF INFECTION/BREAKDOWN Description Free of breakdown, infection while on rehab mod assist.   Outcome: Progressing   Problem: RH SAFETY Goal: RH STG ADHERE TO SAFETY PRECAUTIONS W/ASSISTANCE/DEVICE Description STG Adhere to Safety Precautions With Assistance/Device.Mod  Outcome: Progressing

## 2018-03-24 ENCOUNTER — Inpatient Hospital Stay (HOSPITAL_COMMUNITY): Payer: Medicare Other | Admitting: Occupational Therapy

## 2018-03-24 ENCOUNTER — Inpatient Hospital Stay (HOSPITAL_COMMUNITY): Payer: Medicare Other | Admitting: Speech Pathology

## 2018-03-24 ENCOUNTER — Inpatient Hospital Stay (HOSPITAL_COMMUNITY): Payer: Medicare Other

## 2018-03-24 DIAGNOSIS — S065X0S Traumatic subdural hemorrhage without loss of consciousness, sequela: Secondary | ICD-10-CM

## 2018-03-24 LAB — CBC WITH DIFFERENTIAL/PLATELET
Abs Immature Granulocytes: 0.12 10*3/uL — ABNORMAL HIGH (ref 0.00–0.07)
BASOS ABS: 0 10*3/uL (ref 0.0–0.1)
Basophils Relative: 0 %
Eosinophils Absolute: 0.2 10*3/uL (ref 0.0–0.5)
Eosinophils Relative: 2 %
HEMATOCRIT: 36.2 % — AB (ref 39.0–52.0)
Hemoglobin: 11.8 g/dL — ABNORMAL LOW (ref 13.0–17.0)
Immature Granulocytes: 1 %
Lymphocytes Relative: 25 %
Lymphs Abs: 2.2 10*3/uL (ref 0.7–4.0)
MCH: 34.9 pg — ABNORMAL HIGH (ref 26.0–34.0)
MCHC: 32.6 g/dL (ref 30.0–36.0)
MCV: 107.1 fL — AB (ref 80.0–100.0)
Monocytes Absolute: 1.1 10*3/uL — ABNORMAL HIGH (ref 0.1–1.0)
Monocytes Relative: 13 %
Neutro Abs: 5.3 10*3/uL (ref 1.7–7.7)
Neutrophils Relative %: 59 %
Platelets: 397 10*3/uL (ref 150–400)
RBC: 3.38 MIL/uL — ABNORMAL LOW (ref 4.22–5.81)
RDW: 13.7 % (ref 11.5–15.5)
WBC: 9 10*3/uL (ref 4.0–10.5)
nRBC: 0 % (ref 0.0–0.2)

## 2018-03-24 LAB — VITAMIN B12: Vitamin B-12: 895 pg/mL (ref 180–914)

## 2018-03-24 LAB — FOLATE: FOLATE: 19.4 ng/mL (ref >5.9)

## 2018-03-24 NOTE — Progress Notes (Signed)
Social Work Social Work Assessment and Plan  Patient Details  Name: Alvin Hudson MRN: [id number] Date of Birth: 08/28/1935  Today's Date: 03/23/2018  Problem List:  Patient Active Problem List   Diagnosis Date Noted  . Anemia of chronic disease   . Megaloblastic anemia   . Hypoalbuminemia due to protein-calorie malnutrition (HCC)   . Leukocytosis   . New onset atrial fibrillation (HCC)   . Seizures (HCC)   . 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 History of subdural hematoma   . Dysphagia   . Atrial fibrillation (HCC) 03/17/2018  . Aphasia 03/12/2018  . Seizure (HCC) 03/12/2018  . Subdural hematoma (HCC) 03/10/2018   Past Medical History:  Past Medical History:  Diagnosis Date  . Allergy   . Seizures (HCC)    Past Surgical History:  Past Surgical History:  Procedure Laterality Date  . CRANIOTOMY Left 03/10/2018   Procedure: CRANIOTOMY HEMATOMA EVACUATION SUBDURAL;  Surgeon: Tressie Stalker, MD;  Location: [facility] OR;  Service: Neurosurgery;  Laterality: Left;  . INGUINAL HERNIA REPAIR Bilateral    Social History:  reports that he has never smoked. He has never used smokeless tobacco. He reports current alcohol use of about 14.0 standard drinks of alcohol per week. He reports that he does not use drugs.  Family / Support Systems Marital Status: (P) Married Patient Roles: Spouse, Parent Spouse/Significant Other: (P) wife, Mohammed Langham @ [phone] Children: (P) daughter, [name]) @ [phone];  daughter, Chrissy Dimitri [place], [place]) Anticipated Caregiver: wife and daughter Ability/Limitations of Caregiver: wife has a bad back Caregiver Availability: 24/7 Family Dynamics: Wife notes that their daughter who lives locally is very supportive but does work.  Wife very encouraging to pt during interview and patient with his aphasia.  Social History Preferred language: English Religion: None Cultural Background: NA Education: college Read: Yes Write:  Yes Marine scientist Issues: None Guardian/Conservator: None - per MD, pt is not capable of making decisions on his own behalf - defer to spouse.   Abuse/Neglect Abuse/Neglect Assessment Can Be Completed: Yes Physical Abuse: Denies Verbal Abuse: Denies Sexual Abuse: Denies Exploitation of patient/patient's resources: Denies Self-Neglect: Denies  Emotional Status Pt's affect, behavior and adjustment status: Pt with severe aphasia but does make attempts to answer questions.  Wife completes assessment intake interview on his behalf.  Pt does not appear to be in any significant emotional distress, however, will monitor throughtout stay and refer to neuospychology as indicated. Recent Psychosocial Issues: None Psychiatric History: none Substance Abuse History: None  Patient / Family Perceptions, Expectations & Goals Pt/Family understanding of illness & functional limitations: Pt's wife with good understanding of pt's SDH, surgery performed and current functional limitations/ need for CIR. Premorbid pt/family roles/activities: Pt completely independent and active at home and in the community. Anticipated changes in roles/activities/participation: Wife to assume primary caregiver role. Pt/family expectations/goals: "I just hope his speech will get better but I'm glad he is doing so well physically."  [employer]: None Premorbid Home Care/DME Agencies: None Transportation available at discharge: yes Resource referrals recommended: Neuropsychology, Support group (specify)  Discharge Planning Living Arrangements: Spouse/significant other Support Systems: Spouse/significant other, Children Type of Residence: Private residence Community education officer Resources: [employer], Media planner (specify)([employer]) Financial Resources: Restaurant manager, fast food Screen Referred: No Living Expenses: Own Money Management: Patient Does the patient have any problems obtaining your  medications?: No Home Management: pt and wife shared responsibilities. Patient/Family Preliminary Plans: Pt to d/c home with wife to provide 24/7 supervision. Social Work Anticipated  Follow Up Needs: HH/OP Expected length of stay: 14-18 days  Clinical Impression Unfortunate, elderly gentleman here following crani and evacuation of SDH and now with significant, expressive aphasia.  Wife at bedside and very supportive and assists with interview. Pt does not appear to be in any emotional distress.  Wife able to provide 24/7 supervision upon d/c and goals have been set for supervision overall.  Will follow for support and d/c planning needs.  Cassandra Mcmanaman 03/23/2018, 2:41 PM

## 2018-03-24 NOTE — Progress Notes (Signed)
Physical Therapy Session Note  Patient Details  Name: Alvin Hudson MRN: [id number] Date of Birth: 11/04/1935  Today's Date: 03/24/2018 PT Individual Time: 1052-1200 PT Individual Time Calculation (min): 68 min   Short Term Goals: Week 1:  PT Short Term Goal 1 (Week 1): pt will perform functional transfers with min A PT Short Term Goal 2 (Week 1): pt will attend to therapeutic task x 2 minutes with min A  Skilled Therapeutic Interventions/Progress Updates:    Patient in supine family in room reports sleeping since shower and dressing.  Supine to sit S increased time.  Patient transferred to chair mod A cues for safety due to sitting near edge and almost on armrest.  Patient encouraged to perform w/c mobility with hand over hand on wheels, but not participating.  Patient assisted to gym in w/c and performed sit to stand min to mod A without device.  Gait with HHA and when in hall with railing and HHA x 80' mod A cues for posture, step width and assist for balance, pt with anterior bias, shuffling short steps and high fall risk.  Patient in w/c assisted to gym for car transfer and performed with RW and min to mod A.  Able to verbalize his car at home is "[employer]" but unable to identify model.  Patient performed standing balance activity tossing and retrieving horseshoes with R hand and increased time min to mod A for dynamic balance.  Patient with anxious look and asked if needing to toilet and he responded "yes".  Assisted to room in w/c and stood with RW to ambulate to bathroom min to mod A for safety due to impulsivity and anterior lean with walker too far out.  From toilet sit<>stand min to mod A with grabbar.  Assist for donning brief and pants in standing, pt performing 75% of hygiene.  Assisted to dayroom in w/c and to Nu Step performed x 5 min with UE/LE at level 1 with max cues for continuing activity and for encouragement.  Gait to room from dayroom mod A increased time, several standing stops  (question freezing episodes) and corrected walker position and posture each time and facilitated weight shift to initiate gait again.  Sit to supine on bed S with cues for positioning.  Left in bed call button in reach and bed alarm on.  Therapy Documentation Precautions:  Precautions Precautions: Fall Restrictions Weight Bearing Restrictions: No Pain: Pain Assessment Pain Score: 0-No pain    Therapy/Group: Individual Therapy  Elray Mcgregor  Sheran Lawless, PT 03/24/2018, 11:49 AM

## 2018-03-24 NOTE — Progress Notes (Signed)
Speech Language Pathology Daily Session Note  Patient Details  Name: Alvin Hudson MRN: [id number] Date of Birth: 1935/03/11  Today's Date: 03/24/2018 SLP Individual Time: 1305-1340 SLP Individual Time Calculation (min): 35 min  Short Term Goals: Week 1: SLP Short Term Goal 1 (Week 1): Patient will consume current diet with minimal overt s/s of aspiration and Mod A verbal cues for use of swallowing compensatory strategies.  SLP Short Term Goal 2 (Week 1): Patient will demonstrate sustained attention to functional tasks for ~15 minutes with Min A verbal cues for redirection.  SLP Short Term Goal 3 (Week 1): Patient will self-monitor and correct verbal errors with Mod A multimodal cues.  SLP Short Term Goal 4 (Week 1): Patient will verbalize wants/needs at the word level with Mod A multimodal cues.   Skilled Therapeutic Interventions: Skilled treatment session focused on communication goals. SLP facilitated session by providing extra time and Mod A verbal cues for patient to self-monitor and correct verbal errors at the phrase level while verbalizing information about his family and other topics of interest. Patient restless in his chair and reported his brief was wet. However, upon transfer to the commode, patient's brief was dry and patient required Max A multimodal cues to stand upright and overall problem solving with task. Patient left upright in the wheelchair with his wife present and alarm on. Continue with current plan of care.       Pain Pain Assessment Pain Score: 0-No pain  Therapy/Group: Individual Therapy  Elainna Eshleman 03/24/2018, 2:54 PM

## 2018-03-24 NOTE — Progress Notes (Signed)
Occupational Therapy Session Note  Patient Details  Name: Alvin Hudson MRN: [id number] Date of Birth: November 22, 1935  Today's Date: 03/24/2018 OT Individual Time: 4961-1643 OT Individual Time Calculation (min): 60 min   Short Term Goals: Week 1:  OT Short Term Goal 1 (Week 1): Pt will complete toilet transfer with min A OT Short Term Goal 2 (Week 1): Pt will complete LB dressing with mod A OT Short Term Goal 3 (Week 1): Pt will tolerate standing for 3 mins in preparation for BADL tasks  Skilled Therapeutic Interventions/Progress Updates:     Pt greeted seated on commode in bathroom with nurse tech present, handoff to OT. Pt with successful BM and voided bladder. Pt able to reach behind with toilet paper to help with hygiene, but needed OT assist for thoroughness and verbal cues to drop soiled toilet paper into commode. Pt ambulated from commode to shower with RW and min A with mod verbal cues for RW positioning when transitioning into shower. Bathing completed with overall min A for balance when standing to wash buttocks. Pt completed dressing tasks from wc with min A overall and forced use of R UE to pull pant legs up for neuro re-ed. Pt also needed min A and verbal cues to orient shirt as he tried to put R arm through head hole. Pt needed set-up A and verbal cues for sequencing toothbrushing task. Max cues to spit out toothpaste, which pt eventually did. Stand-pivot back to bed with min A. Pt abel to verbalize need for "tissues" prior to OT departure. Pt left semi-reclined in bed with bed alarm on and needs met.  Therapy Documentation Precautions:  Precautions Precautions: Fall Restrictions Weight Bearing Restrictions: No Pain:   none/denies pain  Therapy/Group: Individual Therapy  Valma Cava 03/24/2018, 9:41 AM

## 2018-03-24 NOTE — Progress Notes (Signed)
Occupational Therapy Session Note  Patient Details  Name: Alvin Hudson MRN: [id number] Date of Birth: 27-Jan-1935  Today's Date: 03/24/2018 OT Individual Time: 1840-1925 OT Individual Time Calculation (min): 45 min    Short Term Goals: Week 1:  OT Short Term Goal 1 (Week 1): Pt will complete toilet transfer with min A OT Short Term Goal 2 (Week 1): Pt will complete LB dressing with mod A OT Short Term Goal 3 (Week 1): Pt will tolerate standing for 3 mins in preparation for BADL tasks  Skilled Therapeutic Interventions/Progress Updates: this session. Patient was offered opportunity to toilet x4 and he denied need.   However at the end of this session, he completed toilet transfer with contact guard assist for changing of correct sized brief (he kept tugging on brief though he stated hdid not need to toilet....finally this clinician understtood he was trying to communicate that the brief was too big and falling down off his hips both when he sat and when he stood.    Otherwise, though patient required extra time to process and work on expressive communication, he tolerated standing x5 at the dynavision at Rwalker for approximately 2 minutes per try before reaching back to sit down.    He was able to work on endurance in prep for balance for self care, transfers and functional self care mobility.     He had difficulty following command to cross midline with hand to tap the buttons for the Dynavision activities.   He appeared a little rigid in his balance and perhaps guarded and hesitated to lose balance crossing midline.   He required moderate tactile cues to notice light on in his peripheral and upper far left and right upper quadrant vision  He wsa left seated at the end of session with safety belt intact in his w/c in his room.    He was able to communicate that he wanted to keep the tv on [employer] and he listened intently.   As well, he demonstrated understanding for pressing his call bell to alert  staff in case needed     Therapy Documentation Precautions:  Precautions Precautions: Fall Restrictions Weight Bearing Restrictions: No  Pain:denied   Therapy/Group: Individual Therapy  Bud Face [facility] 03/24/2018, 8:12 PM

## 2018-03-24 NOTE — Progress Notes (Signed)
[place] PHYSICAL MEDICINE & REHABILITATION PROGRESS NOTE  Subjective/Complaints: Patient seen sitting up in bed this AM.  He slept well overnight per wife.  Wife has questions regarding tremors, no tremors noted.   ROS: Limited due to cognition.  Objective: Vital Signs: Blood pressure 120/74, pulse 82, temperature 97.6 F (36.4 C), resp. rate 18, height 5\' 11"  (1.803 m), weight 63.2 kg, SpO2 100 %. No results found. Recent Labs    03/24/18 0636  WBC 9.0  HGB 11.8*  HCT 36.2*  PLT 397   No results for input(s): NA, K, CL, CO2, GLUCOSE, BUN, CREATININE, CALCIUM in the last 72 hours.  Physical Exam: BP 120/74 (BP Location: Left Arm)   Pulse 82   Temp 97.6 F (36.4 C)   Resp 18   Ht 5\' 11"  (1.803 m)   Wt 63.2 kg   SpO2 100%   BMI 19.43 kg/m  Constitutional: He appears well-developed and well-nourished.  NAD. HENT: Craniotomy site clean and dry.   Eyes: EOMI.  No discharge. Cardiovascular: irregularly irreular.  No JVD.  Respiratory: Effort normal and breath sounds normal.  GI: Soft. Bowel sounds are normal.  Musculoskeletal: No edema or tenderness in extremities  Neurological: He is alert.  Global, expressive >> receptive aphasia (receptive aphasia gradually improving).  Motor: Grossly 4-4+/5 throughout, stabe No tremors appreciated Skin: See above  Psychiatric: Unable to assess due to mentation   Assessment/Plan: 1. Functional deficits secondary to left SDH which require 3+ hours per day of interdisciplinary therapy in a comprehensive inpatient rehab setting.  Physiatrist is providing close team supervision and 24 hour management of active medical problems listed below.  Physiatrist and rehab team continue to assess barriers to discharge/monitor patient progress toward functional and medical goals  Care Tool:  Bathing  Bathing activity did not occur: (did not occur.) Body parts bathed by patient: Right arm, Chest, Abdomen, Front perineal area, Buttocks,  Right upper leg, Left upper leg, Face, Left arm   Body parts bathed by helper: Right lower leg, Left lower leg, Left arm     Bathing assist Assist Level: Minimal Assistance - Patient > 75%     Upper Body Dressing/Undressing Upper body dressing Upper body dressing/undressing activity did not occur (including orthotics): (did not occur; pt was already in bed.) What is the patient wearing?: Pull over shirt    Upper body assist Assist Level: Minimal Assistance - Patient > 75%    Lower Body Dressing/Undressing Lower body dressing    Lower body dressing activity did not occur: (did not occur.) What is the patient wearing?: Incontinence brief, Pants     Lower body assist Assist for lower body dressing: Moderate Assistance - Patient 50 - 74%     Toileting Toileting    Toileting assist Assist for toileting: Moderate Assistance - Patient 50 - 74%     Transfers Chair/bed transfer  Transfers assist  Chair/bed transfer activity did not occur: (did not occur.)  Chair/bed transfer assist level: Minimal Assistance - Patient > 75%     Locomotion Ambulation   Ambulation assist      Assist level: Minimal Assistance - Patient > 75% Assistive device: Walker-rolling Max distance: 45 ft   Walk 10 feet activity   Assist     Assist level: Minimal Assistance - Patient > 75% Assistive device: Walker-rolling   Walk 50 feet activity   Assist Walk 50 feet with 2 turns activity did not occur: Safety/medical concerns  Walk 150 feet activity   Assist Walk 150 feet activity did not occur: Safety/medical concerns         Walk 10 feet on uneven surface  activity   Assist Walk 10 feet on uneven surfaces activity did not occur: Safety/medical concerns         Wheelchair     Assist   Type of Wheelchair: Manual    Wheelchair assist level: Minimal Assistance - Patient > 75% Max wheelchair distance: 25    Wheelchair 50 feet with 2 turns  activity    Assist    Wheelchair 50 feet with 2 turns activity did not occur: Safety/medical concerns       Wheelchair 150 feet activity     Assist Wheelchair 150 feet activity did not occur: Safety/medical concerns          Medical Problem List and Plan: 1.  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 Status post left frontotemporal parietal craniotomy evacuation of subdural hematoma 03/10/2018  Continue CIR 2.  Antithrombotics: -DVT/anticoagulation:  SCDs. No anticoagulation due to subdural hematoma             -antiplatelet therapy: Not applicable 3. Pain Management:  Hydrocodone as needed 4. Mood:  Zoloft 100 mg daily, Restoril 15 mg daily at bedtime             -antipsychotic agents: not applicable 5. Neuropsych: This patient is not capable of making decisions on his own behalf. 6. Skin/Wound Care:  Routine skin checks 7. Fluids/Electrolytes/Nutrition:  Routine in and out's  BMP within acceptable range on 2/25  Labs ordered for Monday 8. Seizure disorder. Keppra 1500 mg twice a day, Vimpat 200 mg twice a day, valproic acid 500 mg every 8 hours. Plan to continue this regimen to follow up outpatient with neurology services. 9. New-onset atrial fibrillation with RVR. Cardizem initiated 60 mg every 6 hours 03/16/2018. Cardiac rate control. No anticoagulation due to subdural hematoma. Follow-up cardiology services Dr. Garnette Scheuermann 10. Dysphagia. Dysphagia #2 thin liquids.   Follow-up modified barium swallow in the future  Advance diet as tolerated 11.  Leukocytosis: Resolved  WBCs 9.0 on 03/24/2018  Afebrile  Continue to monitor 12.  Hypoalbuminemia  Supplement initiated on 2/25 13.  Anemia of chronic disease  Hemoglobin 11.8 on 03/24/2018  Vit B12 WNL  Folate pending  LOS: 4 days A FACE TO FACE EVALUATION WAS PERFORMED  Rane Dumm Karis Juba 03/24/2018, 8:24 AM

## 2018-03-24 NOTE — Progress Notes (Signed)
Social Work Patient ID: Alvin Hudson, male   DOB: April 12, 1935, 83 y.o.   MRN: [id number]   Have reviewed team conference with pt/ wife.  Wife aware of ELS 2 week and supervision goals overall.  No questions at this time.  Ryiah Bellissimo, LCSW

## 2018-03-24 NOTE — Care Management (Signed)
[facility] Individual Statement of Services  Patient Name:  Alvin Hudson  Date:  03/24/2018  Welcome to the [facility].  Our goal is to provide you with an individualized program based on your diagnosis and situation, designed to meet your specific needs.  With this comprehensive rehabilitation program, you will be expected to participate in at least 3 hours of rehabilitation therapies Monday-Friday, with modified therapy programming on the weekends.  Your rehabilitation program will include the following services:  Physical Therapy (PT), Occupational Therapy (OT), Speech Therapy (ST), 24 hour per day rehabilitation nursing, Therapeutic Recreaction (TR), Neuropsychology, Case Management (Social Worker), Rehabilitation Medicine, Nutrition Services and Pharmacy Services  Weekly team conferences will be held on Tuesdays to discuss your progress.  Your Social Worker will talk with you frequently to get your input and to update you on team discussions.  Team conferences with you and your family in attendance may also be held.  Expected length of stay: 14-18 days   Overall anticipated outcome: supervision  Depending on your progress and recovery, your program may change. Your Social Worker will coordinate services and will keep you informed of any changes. Your Social Worker's name and contact numbers are listed  below.  The following services may also be recommended but are not provided by the [facility]:   Driving Evaluations  Home Health Rehabiltiation Services  Outpatient Rehabilitation Services   Arrangements will be made to provide these services after discharge if needed.  Arrangements include referral to agencies that provide these services.  Your insurance has been verified to be:  Medicare; [employer] Your primary doctor is:  [place]  Pertinent information will be shared with your doctor and your insurance company.  Social  Worker:  [place], [place] [phone] or ([phone]   Information discussed with and copy given to patient by: Amada Jupiter, 03/24/2018, 2:46 PM

## 2018-03-25 ENCOUNTER — Inpatient Hospital Stay (HOSPITAL_COMMUNITY): Payer: Medicare Other | Admitting: Speech Pathology

## 2018-03-25 ENCOUNTER — Inpatient Hospital Stay (HOSPITAL_COMMUNITY): Payer: Medicare Other

## 2018-03-25 DIAGNOSIS — S065X9S Traumatic subdural hemorrhage with loss of consciousness of unspecified duration, sequela: Secondary | ICD-10-CM

## 2018-03-25 NOTE — Progress Notes (Signed)
Speech Language Pathology Daily Session Note  Patient Details  Name: Alvin Hudson MRN: [id number] Date of Birth: 1935/08/17  Today's Date: 03/25/2018 SLP Individual Time: 1300-1400 SLP Individual Time Calculation (min): 60 min  Short Term Goals: Week 1: SLP Short Term Goal 1 (Week 1): Patient will consume current diet with minimal overt s/s of aspiration and Mod A verbal cues for use of swallowing compensatory strategies.  SLP Short Term Goal 2 (Week 1): Patient will demonstrate sustained attention to functional tasks for ~15 minutes with Min A verbal cues for redirection.  SLP Short Term Goal 3 (Week 1): Patient will self-monitor and correct verbal errors with Mod A multimodal cues.  SLP Short Term Goal 4 (Week 1): Patient will verbalize wants/needs at the word level with Mod A multimodal cues.   Skilled Therapeutic Interventions:  Skilled treatment session focused on dysphagia and communication goals. SLP facilitated session by providing Max A multimodal cues for bed mobility to move up in bed for better repositioning for safe consumption of lunch. Additionally, SLP provided hand over hand support for self-feeding d/t motoric deficits. Pt required Max A cues to clear right buccal cavity from bite to bite and he required additional physical support to decreased rate of consumption. Pt's shirt was soiled and he required Max A multimodal cues to take off dirty shirt and put on clean shirt.  SLP further facilitated session by providing Min A sentence completion tasks for word finding at the word level. Pt intermittently produce phrase level questions about SLP and phrase level comments after consumption. Pt left upright in bed, bed alarm on and wife present. Continue per current plan of care.      Pain Pain Assessment Pain Scale: Faces Faces Pain Scale: No hurt  Therapy/Group: Individual Therapy  Royer Cristobal 03/25/2018, 2:00 PM

## 2018-03-25 NOTE — Progress Notes (Signed)
Occupational Therapy Session Note  Patient Details  Name: Alvin Hudson MRN: [id number] Date of Birth: [birth year]  Today's Date: 03/25/2018 OT Individual Time: 1530-1600 OT Individual Time Calculation (min): 30 min    Short Term Goals: Week 1:  OT Short Term Goal 1 (Week 1): Pt will complete toilet transfer with min A OT Short Term Goal 2 (Week 1): Pt will complete LB dressing with mod A OT Short Term Goal 3 (Week 1): Pt will tolerate standing for 3 mins in preparation for BADL tasks  Skilled Therapeutic Interventions/Progress Updates:    1;1. Pt completes stand pivot transfersEOB<>w/c<>BSC with demo cues for hand placement on grab bar and rails with MIN A for balance. Pt requires A for clothing management prior to toileting and able to complete thorough hygeine with VC this date. Pt stands at kitchen counter to unload diswasher with RUE and VC fo exaggerated large opening of hand to grasp cups, bowls and mugs for NMR and CGA for balance and reaching high shelves. Exited session with pt seated  In bed call light in reach and all needs met.   Therapy Documentation Precautions:  Precautions Precautions: Fall Restrictions Weight Bearing Restrictions: No General:   Vital Signs:   Pain: Pain Assessment Pain Scale: Faces Faces Pain Scale: No hurt ADL: ADL Eating: Unable to assess Grooming: Maximal cueing, Moderate assistance Upper Body Bathing: Moderate cueing, Moderate assistance Lower Body Bathing: Maximal assistance, Moderate cueing Upper Body Dressing: Moderate cueing, Moderate assistance Lower Body Dressing: Moderate cueing, Maximal assistance Toileting: Unable to assess Toilet Transfer: Moderate assistance Social research officer, government: Unable to assess Vision   Perception    Praxis   Exercises:   Other Treatments:     Therapy/Group: Individual Therapy  Tonny Branch 03/25/2018, 4:20 PM

## 2018-03-25 NOTE — Progress Notes (Signed)
[facility] PHYSICAL MEDICINE & REHABILITATION PROGRESS NOTE  Subjective/Complaints: Patient aphasic, states "brain surgery"  ROS: Limited due to cognition.  Objective: Vital Signs: Blood pressure 125/76, pulse 73, temperature 98 F (36.7 C), resp. rate 18, height 5\' 11"  (1.803 m), weight 59.3 kg, SpO2 99 %. No results found. Recent Labs    03/24/18 0636  WBC 9.0  HGB 11.8*  HCT 36.2*  PLT 397   No results for input(s): NA, K, CL, CO2, GLUCOSE, BUN, CREATININE, CALCIUM in the last 72 hours.  Physical Exam: BP 125/76 (BP Location: Left Arm)   Pulse 73   Temp 98 F (36.7 C)   Resp 18   Ht 5\' 11"  (1.803 m)   Wt 59.3 kg   SpO2 99%   BMI 18.23 kg/m  Constitutional: He appears well-developed and well-nourished.  NAD. HENT: Craniotomy site clean and dry.   Eyes: EOMI.  No discharge. Cardiovascular: irregularly irreular.  No JVD.  Respiratory: Effort normal and breath sounds normal.  GI: Soft. Bowel sounds are normal.  Musculoskeletal: No edema or tenderness in extremities  Neurological: He is alert.  Global, expressive >> receptive aphasia Motor: Grossly 4-4+/5 throughout, stabe  tremors appreciated bilateral upper extremities Skin: See above  Psychiatric: Unable to assess due to mentation   Assessment/Plan: 1. Functional deficits secondary to left SDH which require 3+ hours per day of interdisciplinary therapy in a comprehensive inpatient rehab setting.  Physiatrist is providing close team supervision and 24 hour management of active medical problems listed below.  Physiatrist and rehab team continue to assess barriers to discharge/monitor patient progress toward functional and medical goals  Care Tool:  Bathing  Bathing activity did not occur: (did not occur.) Body parts bathed by patient: Right arm, Chest, Abdomen, Front perineal area, Buttocks, Right upper leg, Left upper leg, Face, Left arm   Body parts bathed by helper: Right lower leg, Left lower leg, Left  arm     Bathing assist Assist Level: Minimal Assistance - Patient > 75%     Upper Body Dressing/Undressing Upper body dressing Upper body dressing/undressing activity did not occur (including orthotics): (did not occur; pt was already in bed.) What is the patient wearing?: Pull over shirt    Upper body assist Assist Level: Minimal Assistance - Patient > 75%    Lower Body Dressing/Undressing Lower body dressing    Lower body dressing activity did not occur: (did not occur.) What is the patient wearing?: Incontinence brief, Pants     Lower body assist Assist for lower body dressing: Moderate Assistance - Patient 50 - 74%     Toileting Toileting    Toileting assist Assist for toileting: Moderate Assistance - Patient 50 - 74%     Transfers Chair/bed transfer  Transfers assist  Chair/bed transfer activity did not occur: (did not occur.)  Chair/bed transfer assist level: Moderate Assistance - Patient 50 - 74%     Locomotion Ambulation   Ambulation assist      Assist level: Moderate Assistance - Patient 50 - 74% Assistive device: Walker-rolling Max distance: 150'   Walk 10 feet activity   Assist     Assist level: Minimal Assistance - Patient > 75% Assistive device: Walker-rolling   Walk 50 feet activity   Assist Walk 50 feet with 2 turns activity did not occur: Safety/medical concerns    Assistive device: Walker-rolling    Walk 150 feet activity   Assist Walk 150 feet activity did not occur: Safety/medical concerns  Assist level: Moderate  Assistance - Patient - 50 - 74% Assistive device: Walker-rolling    Walk 10 feet on uneven surface  activity   Assist Walk 10 feet on uneven surfaces activity did not occur: Safety/medical concerns         Wheelchair     Assist   Type of Wheelchair: Manual    Wheelchair assist level: Dependent - Patient 0% Max wheelchair distance: 25    Wheelchair 50 feet with 2 turns  activity    Assist    Wheelchair 50 feet with 2 turns activity did not occur: Safety/medical concerns       Wheelchair 150 feet activity     Assist Wheelchair 150 feet activity did not occur: Safety/medical concerns          Medical Problem List and Plan: 1.  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 Status post left frontotemporal parietal craniotomy evacuation of subdural hematoma 03/10/2018  Continue CIR, PT OT speech 2.  Antithrombotics: -DVT/anticoagulation:  SCDs. No anticoagulation due to subdural hematoma             -antiplatelet therapy: Not applicable 3. Pain Management:  Hydrocodone as needed 4. Mood:  Zoloft 100 mg daily, Restoril 15 mg daily at bedtime             -antipsychotic agents: not applicable 5. Neuropsych: This patient is not capable of making decisions on his own behalf. 6. Skin/Wound Care:  Routine skin checks 7. Fluids/Electrolytes/Nutrition:  Routine in and out's  BMP within acceptable range on 2/25  Labs ordered for Monday 8. Seizure disorder. Keppra 1500 mg twice a day, Vimpat 200 mg twice a day, valproic acid 500 mg every 8 hours. Plan to continue this regimen to follow up outpatient with neurology services. 9. New-onset atrial fibrillation with RVR. Cardizem initiated 60 mg every 6 hours 03/16/2018. Cardiac rate control. No anticoagulation due to subdural hematoma. Follow-up cardiology services Dr. Garnette Scheuermann 10. Dysphagia. Dysphagia #2 thin liquids.   Follow-up modified barium swallow in the future  Advance diet as tolerated    13.  Hypoalbuminemia  Supplement initiated on 2/25 14.  Anemia with macrocytosis  Hemoglobin 11.8 on 03/24/2018  Vit B12 WNL  Folate normal on 03/24/2018  LOS: 5 days A FACE TO FACE EVALUATION WAS PERFORMED  Erick Colace 03/25/2018, 11:37 AM

## 2018-03-25 NOTE — Progress Notes (Signed)
Occupational Therapy Session Note  Patient Details  Name: Alvin Hudson MRN: [id number] Date of Birth: 20-Sep-1935  Today's Date: 03/25/2018 OT Individual Time: 1030-1055 OT Individual Time Calculation (min): 25 min    Short Term Goals: Week 1:  OT Short Term Goal 1 (Week 1): Pt will complete toilet transfer with min A OT Short Term Goal 2 (Week 1): Pt will complete LB dressing with mod A OT Short Term Goal 3 (Week 1): Pt will tolerate standing for 3 mins in preparation for BADL tasks  Skilled Therapeutic Interventions/Progress Updates:    1:1. Pt recived on toilet with NT present. OT ambulates with RW out of bathroom and MOD A overall to maintain safe distance to RW and steer. Pt washes hands, brushes teeth and combs hair with MOD A overall for grooming as pt attempting to use RUE functionally, however unaware when items are dropped or not picked up. Pt completes 2x1 min rounds on dynavision with LUE-reaction timing 5.3 seconds in all 4 quadrants and LUE 6.5 reaction time while seated in w/c. Eixted session with pt seated in w/c, call ligh tin reach and all needs emt Therapy Documentation Precautions:  Precautions Precautions: Fall Restrictions Weight Bearing Restrictions: No General:   Vital Signs:   Pain:   ADL: ADL Eating: Unable to assess Grooming: Maximal cueing, Moderate assistance Upper Body Bathing: Moderate cueing, Moderate assistance Lower Body Bathing: Maximal assistance, Moderate cueing Upper Body Dressing: Moderate cueing, Moderate assistance Lower Body Dressing: Moderate cueing, Maximal assistance Toileting: Unable to assess Toilet Transfer: Moderate assistance Film/video editor: Unable to assess Vision   Perception    Praxis   Exercises:   Other Treatments:     Therapy/Group: Individual Therapy  Shon Hale 03/25/2018, 10:57 AM

## 2018-03-26 ENCOUNTER — Inpatient Hospital Stay (HOSPITAL_COMMUNITY): Payer: Medicare Other

## 2018-03-26 DIAGNOSIS — G8191 Hemiplegia, unspecified affecting right dominant side: Secondary | ICD-10-CM

## 2018-03-26 NOTE — Progress Notes (Signed)
Physical Therapy Session Note  Patient Details  Name: Alvin Hudson MRN: [id number] Date of Birth: 05/21/1935  Today's Date: 03/26/2018 PT Individual Time: 0935-1030 PT Individual Time Calculation (min): 55 min   Short Term Goals: Week 1:  PT Short Term Goal 1 (Week 1): pt will perform functional transfers with min A PT Short Term Goal 2 (Week 1): pt will attend to therapeutic task x 2 minutes with min A  Skilled Therapeutic Interventions/Progress Updates:    Session focused on addressing cognitive remediation during functional mobility training, NMR for balance training and coordination/motor control, and overall functional endurance. Pt performs sit <> stands and ambulatory transfers throughout session with overall min assist and verbal and tactile cues for hand placement and technique as well as initiation of movement. Pt performing mobility with and without RW (at times mod assist without AD during short distance gait) but overall min assist and extra time and cues for increasing step length and widening BOS. Stair negotiation training with self selected reciprocal pattern to address balance, coordination, strength, and functional mobility with overall min assist and cues for safety and sequencing x 12 steps with B handrails. NMR during dynamic gait and balance training for obstacle negotiation (turning and stepping over objects), sit <> stands, and on foam rocker board (lateral weightshifting and balance) with min assist. Pt request ot use bathroom, returned to room and assisted with toilet transfers at min assist level and min during standing balance for clothing management. Returned back to bed end of session with min assist without AD and all needs in reach.   Therapy Documentation Precautions:  Precautions Precautions: Fall Restrictions Weight Bearing Restrictions: No   Pain:  Does not report pain.      Therapy/Group: Individual Therapy   Philip Aspen, PT, DPT, CBIS  03/26/2018,  10:47 AM

## 2018-03-26 NOTE — Progress Notes (Signed)
Occupational Therapy Session Note  Patient Details  Name: Alvin Hudson MRN: [id number] Date of Birth: March 06, 1935  Today's Date: 03/26/2018 OT Individual Time: 4315-4008 OT Individual Time Calculation (min): 55 min    Short Term Goals: Week 1:  OT Short Term Goal 1 (Week 1): Pt will complete toilet transfer with min A OT Short Term Goal 2 (Week 1): Pt will complete LB dressing with mod A OT Short Term Goal 3 (Week 1): Pt will tolerate standing for 3 mins in preparation for BADL tasks  Skilled Therapeutic Interventions/Progress Updates:    1:1. Pt received in bed with NT present measuring BP 103/72. Pt completes squat pivot transfers with MIN A and VC for controlled decent into chair. Pt dons shoes and OT installs shoe buttons while RN delivers medication. Pt able to fasten B shoes. Pt stands with CGA up to MOD A for standing balance d/t posterior bias while playing cornhole. Pt responds well to cue to "shift weight into your toes." Pt completes pipe tree activity while seated in w/c with max VC for noticing error during building 2 simple figures. Exited session with p tseated in w/c, call light in reach all need smet and exit alarm on   Therapy Documentation Precautions:  Precautions Precautions: Fall Restrictions Weight Bearing Restrictions: No General:   Vital Signs: Therapy Vitals Temp: 98.4 F (36.9 C) Temp Source: Oral Pulse Rate: 74 Resp: 19 BP: 103/72 Patient Position (if appropriate): Lying Oxygen Therapy SpO2: 99 % O2 Device: Room Air Pain:   ADL: ADL Eating: Unable to assess Grooming: Maximal cueing, Moderate assistance Upper Body Bathing: Moderate cueing, Moderate assistance Lower Body Bathing: Maximal assistance, Moderate cueing Upper Body Dressing: Moderate cueing, Moderate assistance Lower Body Dressing: Moderate cueing, Maximal assistance Toileting: Unable to assess Toilet Transfer: Moderate assistance Film/video editor: Unable to assess Vision    Perception    Praxis   Exercises:   Other Treatments:     Therapy/Group: Individual Therapy  Shon Hale 03/26/2018, 4:06 PM

## 2018-03-26 NOTE — Progress Notes (Signed)
[facility] PHYSICAL MEDICINE & REHABILITATION PROGRESS NOTE  Subjective/Complaints: "Speech wobbly", discussed PT activities with physical therapist today.  Patient was doing balance activities in the gym  ROS: Limited due to cognition.  Objective: Vital Signs: Blood pressure 122/80, pulse 79, temperature 97.8 F (36.6 C), resp. rate 16, height 5\' 11"  (1.803 m), weight 59.6 kg, SpO2 99 %. No results found. Recent Labs    03/24/18 0636  WBC 9.0  HGB 11.8*  HCT 36.2*  PLT 397   No results for input(s): NA, K, CL, CO2, GLUCOSE, BUN, CREATININE, CALCIUM in the last 72 hours.  Physical Exam: BP 122/80 (BP Location: Left Arm)   Pulse 79   Temp 97.8 F (36.6 C)   Resp 16   Ht 5\' 11"  (1.803 m)   Wt 59.6 kg   SpO2 99%   BMI 18.33 kg/m  Constitutional: He appears well-developed and well-nourished.  NAD. HENT: Craniotomy site clean and dry.   Eyes: EOMI.  No discharge. Cardiovascular: irregularly irreular.  No JVD.  Respiratory: Effort normal and breath sounds normal.  GI: Soft. Bowel sounds are normal.  Musculoskeletal: No edema or tenderness in extremities  Neurological: He is alert.  Global, expressive >> receptive aphasia Able to name simple objects Motor: Grossly 4-4+/5 throughout, stabe  tremors appreciated bilateral upper extremities Skin: See above  Psychiatric: Unable to assess due to mentation   Assessment/Plan: 1. Functional deficits secondary to left SDH which require 3+ hours per day of interdisciplinary therapy in a comprehensive inpatient rehab setting.  Physiatrist is providing close team supervision and 24 hour management of active medical problems listed below.  Physiatrist and rehab team continue to assess barriers to discharge/monitor patient progress toward functional and medical goals  Care Tool:  Bathing  Bathing activity did not occur: (did not occur.) Body parts bathed by patient: Right arm, Chest, Abdomen, Front perineal area, Buttocks, Right  upper leg, Left upper leg, Face, Left arm   Body parts bathed by helper: Right lower leg, Left lower leg, Left arm     Bathing assist Assist Level: Minimal Assistance - Patient > 75%     Upper Body Dressing/Undressing Upper body dressing Upper body dressing/undressing activity did not occur (including orthotics): (did not occur; pt was already in bed.) What is the patient wearing?: Pull over shirt    Upper body assist Assist Level: Minimal Assistance - Patient > 75%    Lower Body Dressing/Undressing Lower body dressing    Lower body dressing activity did not occur: (did not occur.) What is the patient wearing?: Incontinence brief, Pants     Lower body assist Assist for lower body dressing: Moderate Assistance - Patient 50 - 74%     Toileting Toileting    Toileting assist Assist for toileting: Moderate Assistance - Patient 50 - 74%     Transfers Chair/bed transfer  Transfers assist  Chair/bed transfer activity did not occur: (did not occur.)  Chair/bed transfer assist level: Minimal Assistance - Patient > 75%     Locomotion Ambulation   Ambulation assist      Assist level: Minimal Assistance - Patient > 75% Assistive device: Walker-rolling Max distance: 125   Walk 10 feet activity   Assist     Assist level: Minimal Assistance - Patient > 75% Assistive device: Walker-rolling   Walk 50 feet activity   Assist Walk 50 feet with 2 turns activity did not occur: Safety/medical concerns  Assist level: Minimal Assistance - Patient > 75% Assistive device: Walker-rolling  Walk 150 feet activity   Assist Walk 150 feet activity did not occur: Safety/medical concerns  Assist level: Moderate Assistance - Patient - 50 - 74% Assistive device: Walker-rolling    Walk 10 feet on uneven surface  activity   Assist Walk 10 feet on uneven surfaces activity did not occur: Safety/medical concerns         Wheelchair     Assist   Type of Wheelchair:  Manual    Wheelchair assist level: Dependent - Patient 0% Max wheelchair distance: 25    Wheelchair 50 feet with 2 turns activity    Assist    Wheelchair 50 feet with 2 turns activity did not occur: Safety/medical concerns       Wheelchair 150 feet activity     Assist Wheelchair 150 feet activity did not occur: Safety/medical concerns          Medical Problem List and Plan: 1.  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 Status post left frontotemporal parietal craniotomy evacuation of subdural hematoma 03/10/2018  Continue CIR, PT OT speech 2.  Antithrombotics: -DVT/anticoagulation:  SCDs. No anticoagulation due to subdural hematoma             -antiplatelet therapy: Not applicable 3. Pain Management:  Hydrocodone as needed 4. Mood:  Zoloft 100 mg daily, Restoril 15 mg daily at bedtime             -antipsychotic agents: not applicable 5. Neuropsych: This patient is not capable of making decisions on his own behalf. 6. Skin/Wound Care:  Routine skin checks 7. Fluids/Electrolytes/Nutrition:  Routine in and out's  BMP within acceptable range on 2/25  Labs ordered for Monday 8. Seizure disorder. Keppra 1500 mg twice a day, Vimpat 200 mg twice a day, valproic acid 500 mg every 8 hours. Plan to continue this regimen to follow up outpatient with neurology services. 9. New-onset atrial fibrillation with RVR. Cardizem initiated 60 mg every 6 hours 03/16/2018. Cardiac rate control. No anticoagulation due to subdural hematoma. Follow-up cardiology services Dr. Garnette Scheuermann 10. Dysphagia. Dysphagia #2 thin liquids.   Follow-up modified barium swallow in the future  Advance diet as tolerated    13.  Hypoalbuminemia  Supplement initiated on 2/25 14.  Anemia with macrocytosis  Hemoglobin 11.8 on 03/24/2018  Vit B12 WNL  Folate normal on 03/24/2018  LOS: 6 days A FACE TO FACE EVALUATION WAS PERFORMED  Erick Colace 03/26/2018, 10:38 AM

## 2018-03-26 NOTE — Progress Notes (Signed)
Occupational Therapy Session Note  Patient Details  Name: Alvin Hudson MRN: [id number] Date of Birth: 12/02/1935  Today's Date: 03/26/2018 OT Individual Time: 0700-0815 OT Individual Time Calculation (min): 75 min    Short Term Goals: Week 1:  OT Short Term Goal 1 (Week 1): Pt will complete toilet transfer with min A OT Short Term Goal 2 (Week 1): Pt will complete LB dressing with mod A OT Short Term Goal 3 (Week 1): Pt will tolerate standing for 3 mins in preparation for BADL tasks  Skilled Therapeutic Interventions/Progress Updates:    1:1. Pt received in bed during coughing spell. OT coaches pt through deep diaphramatic box breathing with focus on expanding large musculature to calm down system. Pt completes transfers and bathing sit to stand from shower chair with MIN A and VC for hand placement. Pt continue to demo difficulty recalling to reach back to chair for safety. Pt dresses sit to stand with S for shirt and MIN A for standing balnace for pants. Pt requires MOD A for grooming d/t decreased  Attention and motor planning for gross grasp on RUE. Pt able to Shaurya B socks and shoes. OT fastens. Exited session with tp setaed in w/c, acll light in reach and all needs met  Therapy Documentation Precautions:  Precautions Precautions: Fall Restrictions Weight Bearing Restrictions: No General:   Vital Signs: Therapy Vitals Temp: 97.8 F (36.6 C) Pulse Rate: 79 Resp: 16 BP: 122/80 Patient Position (if appropriate): Lying Oxygen Therapy SpO2: 99 % O2 Device: Room Air Pain:   ADL: ADL Eating: Unable to assess Grooming: Maximal cueing, Moderate assistance Upper Body Bathing: Moderate cueing, Moderate assistance Lower Body Bathing: Maximal assistance, Moderate cueing Upper Body Dressing: Moderate cueing, Moderate assistance Lower Body Dressing: Moderate cueing, Maximal assistance Toileting: Unable to assess Toilet Transfer: Moderate assistance Walk-In Shower Transfer: Unable to  assess Vision   Perception    Praxis   Exercises:   Other Treatments:     Therapy/Group: Individual Therapy   M  03/26/2018, 8:03 AM  

## 2018-03-27 ENCOUNTER — Inpatient Hospital Stay (HOSPITAL_COMMUNITY): Payer: Medicare Other | Admitting: Speech Pathology

## 2018-03-27 ENCOUNTER — Inpatient Hospital Stay (HOSPITAL_COMMUNITY): Payer: Medicare Other | Admitting: Physical Therapy

## 2018-03-27 ENCOUNTER — Inpatient Hospital Stay (HOSPITAL_COMMUNITY): Payer: Medicare Other | Admitting: Occupational Therapy

## 2018-03-27 DIAGNOSIS — S065X2S Traumatic subdural hemorrhage with loss of consciousness of 31 minutes to 59 minutes, sequela: Secondary | ICD-10-CM

## 2018-03-27 LAB — BASIC METABOLIC PANEL
ANION GAP: 6 (ref 5–15)
BUN: 16 mg/dL (ref 8–23)
CO2: 32 mmol/L (ref 22–32)
Calcium: 9.1 mg/dL (ref 8.9–10.3)
Chloride: 99 mmol/L (ref 98–111)
Creatinine, Ser: 0.74 mg/dL (ref 0.61–1.24)
GFR calc Af Amer: >60 mL/min (ref >60)
GLUCOSE: 111 mg/dL — AB (ref 70–99)
Potassium: 3.8 mmol/L (ref 3.5–5.1)
Sodium: 137 mmol/L (ref 135–145)

## 2018-03-27 NOTE — Progress Notes (Signed)
Physical Therapy Session Note  Patient Details  Name: Alvin Hudson MRN: [id number] Date of Birth: [birth year]  Today's Date: 03/27/2018 PT Individual Time: 1000-1055 PT Individual Time Calculation (min): 55 min   Short Term Goals: Week 1:  PT Short Term Goal 1 (Week 1): pt will perform functional transfers with min A PT Short Term Goal 2 (Week 1): pt will attend to therapeutic task x 2 minutes with min A  Skilled Therapeutic Interventions/Progress Updates:    pt performs supine <>sit with supervision, increased time and cuing for attention and initiation.  Squat pivot transfers throughout session with mod A, increased fwd trunk flexion noted.  Gait training with RW 100' x 3 throughout session with cues for attention, frequent standing rest breaks due to fatigue and decreased attention, shuffling gait with increased fwd lean vs last sessions, requires min/mod A to prevent falls.  Standing squat and reach task with focus on LE strength and Rt UE grasp and release with min A for balance, increased time for Rt UE.  Seated card game "war" with pt able to manage cards with Rt UE with increased time, able to play game with min cuing.  Pt left in bed with needs at hand, alarm set.  Therapy Documentation Precautions:  Precautions Precautions: Fall Restrictions Weight Bearing Restrictions: No Pain: Pain Assessment Pain Scale: 0-10 Pain Score: 0-No pain    Therapy/Group: Individual Therapy  Adit Riddles 03/27/2018, 11:07 AM

## 2018-03-27 NOTE — Progress Notes (Signed)
Occupational Therapy Session Note  Patient Details  Name: Alvin Hudson MRN: [id number] Date of Birth: 12-11-1935  Today's Date: 03/27/2018 OT Individual Time: 7829-5621 OT Individual Time Calculation (min): 70 min   Short Term Goals: Week 1:  OT Short Term Goal 1 (Week 1): Pt will complete toilet transfer with min A OT Short Term Goal 2 (Week 1): Pt will complete LB dressing with mod A OT Short Term Goal 3 (Week 1): Pt will tolerate standing for 3 mins in preparation for BADL tasks  Skilled Therapeutic Interventions/Progress Updates:    Pt greeted semi-reclined in bed and agreeable to OT treatment session. Pt declined bathing/dressing or need to go to the bathroom. Dynamic standing activity with horse shoe toss with pt needing mod A and verbal cues to come to standing w/ RW. Pt with posterior bias, requiring min-mod A for dynamic balance when tossing horse shoes. Worked on anterior weight shift with standing card matching activity with improved ability to weight shift forward-Min-CGA for balance throughout task.  Pt with improved ability to form sentences today and express needs. Pt reported need to go to the bathroom. OT returned pt to room in wc for urgency. Stand-pivot to commode with heavy use of grab bars and min A. Pt voided bladder and had successful BM. Assistance needed for 3/3 toileting steps. Standing balance and reaching with standing hand washing tasks with min A for balance w/o UE support. Worked on functional use of R UE with medium soft red thera-putty. Worked on making a ball with OT demonstration with pt having difficulty understanding directions. Focus on functional grasp and pinch. Pt returned to room at end of session and when asked if he wanted to stay in the bed or the chair, pt stated chair. OT donned safety belt, then pt said "no, bed" so pt completed stand-pivot back to bed with min A and verbal cues for hand placement. Pt left semi-reclined in bed with bed alarm on and needs  met.   Therapy Documentation Precautions:  Precautions Precautions: Fall Restrictions Weight Bearing Restrictions: No Pain:   none/denies pain  Therapy/Group: Individual Therapy  Valma Cava 03/27/2018, 2:39 PM

## 2018-03-27 NOTE — Progress Notes (Signed)
Speech Language Pathology Daily Session Note  Patient Details  Name: Alvin Hudson MRN: [id number] Date of Birth: March 23, 1935  Today's Date: 03/27/2018 SLP Individual Time: 3570-1779 SLP Individual Time Calculation (min): 55 min  Short Term Goals: Week 1: SLP Short Term Goal 1 (Week 1): Patient will consume current diet with minimal overt s/s of aspiration and Mod A verbal cues for use of swallowing compensatory strategies.  SLP Short Term Goal 2 (Week 1): Patient will demonstrate sustained attention to functional tasks for ~15 minutes with Min A verbal cues for redirection.  SLP Short Term Goal 3 (Week 1): Patient will self-monitor and correct verbal errors with Mod A multimodal cues.  SLP Short Term Goal 4 (Week 1): Patient will verbalize wants/needs at the word level with Mod A multimodal cues.   Skilled Therapeutic Interventions: Skilled treatment session focused on dysphagia and speech goals. Patient consumed his small pills whole with water without overt s/s of aspiration but requested to get the rest of his pills whole in puree due to fatigue. Therefore, recommend patient continue medications whole with puree. SLP also facilitated session by providing Min A verbal cues for problem solving during a 6 step picture sequencing task with extra time and Min A verbal cues needed to verbally describe the pictures at the phrase level. Overall,  patient with increased ability to express wants/needs at the phrase and sentence level during informal and functional tasks. Patient left upright in wheelchair with alarm on and wife present. Continue with current plan of care.      Pain No/Denies Pain   Therapy/Group: Individual Therapy  Yordin Rhoda 03/27/2018, 12:29 PM

## 2018-03-27 NOTE — Progress Notes (Signed)
[facility] PHYSICAL MEDICINE & REHABILITATION PROGRESS NOTE  Subjective/Complaints: Eating breakfast. Wife at bedside. Doesn't like coffee. Wife has concerns about his mood. Pt is anxious to get home  ROS: limited due to language/communication    Objective: Vital Signs: Blood pressure (!) 114/91, pulse 69, temperature (!) 97.5 F (36.4 C), temperature source Oral, resp. rate 19, height 5\' 11"  (1.803 m), weight 63.7 kg, SpO2 99 %. No results found. No results for input(s): WBC, HGB, HCT, PLT in the last 72 hours. No results for input(s): NA, K, CL, CO2, GLUCOSE, BUN, CREATININE, CALCIUM in the last 72 hours.  Physical Exam: BP (!) 114/91 (BP Location: Left Arm)   Pulse 69   Temp (!) 97.5 F (36.4 C) (Oral)   Resp 19   Ht 5\' 11"  (1.803 m)   Wt 63.7 kg   SpO2 99%   BMI 19.59 kg/m  Constitutional: No distress . Vital signs reviewed. HEENT: EOMI, oral membranes moist Neck: supple Cardiovascular: RRR without murmur. No JVD    Respiratory: CTA Bilaterally without wheezes or rales. Normal effort    GI: BS +, non-tender, non-distended  Musculoskeletal: No edema or tenderness in extremities  Neurological: He is alert.  Global, expressive >> receptive aphasia--able to speak simple phrases and words with extra time.  Motor: Grossly 4-4+/5 throughout, needs extra time with right hand  tremors appreciated bilateral upper extremities Skin: See above  Psychiatric: pleasant, a little flat   Assessment/Plan: 1. Functional deficits secondary to left SDH which require 3+ hours per day of interdisciplinary therapy in a comprehensive inpatient rehab setting.  Physiatrist is providing close team supervision and 24 hour management of active medical problems listed below.  Physiatrist and rehab team continue to assess barriers to discharge/monitor patient progress toward functional and medical goals  Care Tool:  Bathing  Bathing activity did not occur: (did not occur.) Body parts bathed  by patient: Right arm, Chest, Abdomen, Front perineal area, Buttocks, Right upper leg, Left upper leg, Face, Left arm   Body parts bathed by helper: Right lower leg, Left lower leg, Left arm     Bathing assist Assist Level: Minimal Assistance - Patient > 75%     Upper Body Dressing/Undressing Upper body dressing Upper body dressing/undressing activity did not occur (including orthotics): (did not occur; pt was already in bed.) What is the patient wearing?: Pull over shirt    Upper body assist Assist Level: Minimal Assistance - Patient > 75%    Lower Body Dressing/Undressing Lower body dressing    Lower body dressing activity did not occur: (did not occur.) What is the patient wearing?: Incontinence brief, Pants     Lower body assist Assist for lower body dressing: Moderate Assistance - Patient 50 - 74%     Toileting Toileting    Toileting assist Assist for toileting: Moderate Assistance - Patient 50 - 74%     Transfers Chair/bed transfer  Transfers assist  Chair/bed transfer activity did not occur: (did not occur.)  Chair/bed transfer assist level: Minimal Assistance - Patient > 75%     Locomotion Ambulation   Ambulation assist      Assist level: Minimal Assistance - Patient > 75% Assistive device: Walker-rolling Max distance: 125   Walk 10 feet activity   Assist     Assist level: Minimal Assistance - Patient > 75% Assistive device: Walker-rolling   Walk 50 feet activity   Assist Walk 50 feet with 2 turns activity did not occur: Safety/medical concerns  Assist level:  Minimal Assistance - Patient > 75% Assistive device: Walker-rolling    Walk 150 feet activity   Assist Walk 150 feet activity did not occur: Safety/medical concerns  Assist level: Moderate Assistance - Patient - 50 - 74% Assistive device: Walker-rolling    Walk 10 feet on uneven surface  activity   Assist Walk 10 feet on uneven surfaces activity did not occur:  Safety/medical concerns         Wheelchair     Assist   Type of Wheelchair: Manual    Wheelchair assist level: Dependent - Patient 0% Max wheelchair distance: 25    Wheelchair 50 feet with 2 turns activity    Assist    Wheelchair 50 feet with 2 turns activity did not occur: Safety/medical concerns       Wheelchair 150 feet activity     Assist Wheelchair 150 feet activity did not occur: Safety/medical concerns          Medical Problem List and Plan: 1.  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 Status post left frontotemporal parietal craniotomy evacuation of subdural hematoma 03/10/2018  Continue CIR, PT OT speech  -spoke with wife re: goals, team conference, etc tloday 2.  Antithrombotics: -DVT/anticoagulation:  SCDs. No anticoagulation due to subdural hematoma             -antiplatelet therapy: Not applicable 3. Pain Management:  Hydrocodone as needed 4. Mood:  Zoloft 100 mg daily, Restoril 15 mg daily at bedtime             -antipsychotic agents: not applicable  -discussed reactive mood changes which can take place after TBI. Will continue to observe closely for now.  5. Neuropsych: This patient is not capable of making decisions on his own behalf. 6. Skin/Wound Care:  Routine skin checks 7. Fluids/Electrolytes/Nutrition:  Routine in and out's  BMP within acceptable range on 2/25  Labs ordered for Monday which are still pending 8. Seizure disorder. Keppra 1500 mg twice a day, Vimpat 200 mg twice a day, valproic acid 500 mg every 8 hours.   -Plan to continue this regimen to follow up outpatient with neurology services. 9. New-onset atrial fibrillation with RVR. Cardizem initiated 60 mg every 6 hours 03/16/2018. Cardiac rate control. No anticoagulation due to subdural hematoma. Follow-up cardiology services Dr. Garnette Scheuermann 10. Dysphagia. Dysphagia #2 thin liquids.   Follow-up modified barium swallow in the future  Advance  diet as tolerated    13.  Hypoalbuminemia  Supplement initiated on 2/25 14.  Anemia with macrocytosis  Hemoglobin 11.8 on 03/24/2018  Vit B12 WNL  Folate normal on 03/24/2018  LOS: 7 days A FACE TO FACE EVALUATION WAS PERFORMED  Ranelle Oyster 03/27/2018, 8:40 AM

## 2018-03-28 ENCOUNTER — Inpatient Hospital Stay (HOSPITAL_COMMUNITY): Payer: Medicare Other | Admitting: Occupational Therapy

## 2018-03-28 ENCOUNTER — Inpatient Hospital Stay (HOSPITAL_COMMUNITY): Payer: Medicare Other | Admitting: Speech Pathology

## 2018-03-28 ENCOUNTER — Inpatient Hospital Stay (HOSPITAL_COMMUNITY): Payer: Medicare Other | Admitting: Physical Therapy

## 2018-03-28 MED ORDER — SORBITOL 70 % SOLN
30.0000 mL | Status: AC
  Administered 2018-03-28: 30 mL via ORAL
  Filled 2018-03-28: qty 30

## 2018-03-28 MED ORDER — DILTIAZEM HCL 60 MG PO TABS
60.0000 mg | ORAL_TABLET | Freq: Four times a day (QID) | ORAL | Status: AC
  Administered 2018-03-28 – 2018-04-11 (×55): 60 mg via ORAL
  Filled 2018-03-28 (×55): qty 1

## 2018-03-28 MED ORDER — DIVALPROEX SODIUM 500 MG PO DR TAB
500.0000 mg | DELAYED_RELEASE_TABLET | Freq: Three times a day (TID) | ORAL | Status: DC
  Administered 2018-03-28 – 2018-04-11 (×43): 500 mg via ORAL
  Filled 2018-03-28 (×42): qty 1

## 2018-03-28 NOTE — Progress Notes (Signed)
Occupational Therapy Weekly Progress Note  Patient Details  Name: Alvin Hudson MRN: [id number] Date of Birth: [birth year]  Beginning of progress report period: March 21, 2018 End of progress report period: March 28, 2018  Patient has met 1 of 3 short term goals.  Pt is making slow progress with OT. Pt continues to have difficulty motor planning within transfers and ambulation within BADL tasks. Cognition seems to be improving slightly, but still has ataxia and apraxia within BADL tasks requiring mod A overall. Continue current POC,  Patient continues to demonstrate the following deficits: muscle weakness, impaired timing and sequencing, unbalanced muscle activation, motor apraxia, ataxia, decreased coordination and decreased motor planning, decreased attention to right, decreased motor planning and ideational apraxia, decreased initiation, decreased attention, decreased awareness, decreased problem solving, decreased safety awareness, decreased memory and delayed processing and decreased sitting balance, decreased standing balance, decreased postural control, hemiplegia and decreased balance strategies and therefore will continue to benefit from skilled OT intervention to enhance overall performance with BADL.  Patient progressing toward long term goals..  Continue plan of care.  OT Short Term Goals Week 1:  OT Short Term Goal 1 (Week 1): Pt will complete toilet transfer with min A OT Short Term Goal 1 - Progress (Week 1): Not met OT Short Term Goal 2 (Week 1): Pt will complete LB dressing with mod A OT Short Term Goal 2 - Progress (Week 1): Met OT Short Term Goal 3 (Week 1): Pt will tolerate standing for 3 mins in preparation for BADL tasks OT Short Term Goal 3 - Progress (Week 1): Not met Week 2:  OT Short Term Goal 1 (Week 2): Pt will complete toilet transfers with min A OT Short Term Goal 2 (Week 2): Pt will stand for 1 grooming task with min A for balance OT Short Term Goal 3 (Week 2): Pt  will complete 1/3 toileting steps  Therapy/Group: Individual Therapy  Valma Cava 03/28/2018, 10:00 AM

## 2018-03-28 NOTE — Progress Notes (Signed)
Physical Therapy Session Note  Patient Details  Name: Gussie Odoms MRN: [id number] Date of Birth: December 13, 1935  Today's Date: 03/28/2018 PT Individual Time: 1115-1155 PT Individual Time Calculation (min): 40 min   Short Term Goals: Week 2:  PT Short Term Goal 1 (Week 2): pt will perform functional transfers with min A PT Short Term Goal 2 (Week 2): pt will perform gait in controlled environment x 100' with min A  Skilled Therapeutic Interventions/Progress Updates:    pt asleep upon PT entry, requires increased time to awaken.  Pt performs transfers with mod A throughout session.  Gait with RW with improved upright posture this session, min/mod A.  Standing balance training with ball kick and ball toss with mod/max A for standing balance, multimodal cues for coordination and frequent rest breaks.  Attempt serial counting with ball toss with pt requiring max phonemic cues to complete. Pt left in bed with needs at hand, alarm set.  Therapy Documentation Precautions:  Precautions Precautions: Fall Restrictions Weight Bearing Restrictions: No Pain:  no c/o pain   Therapy/Group: Individual Therapy  Zethan Alfieri 03/28/2018, 12:56 PM

## 2018-03-28 NOTE — Progress Notes (Signed)
Nutrition Follow-up  DOCUMENTATION CODES:   Severe malnutrition in context of acute illness/injury  INTERVENTION:  - Continue Ensure Enlive po BID, each supplement provides 350 kcal and 20 grams of protein (vanilla) - Continue Prostat BID - Magic cup BID with meals, each supplement provides 290 kcal and 9 grams of protein  NUTRITION DIAGNOSIS:   Severe Malnutrition related to acute illness(left SDH s/p crani and resulting dysphagia) as evidenced by mild fat depletion, mild muscle depletion, moderate muscle depletion, severe muscle depletion, percent weight loss(8.1% weight loss in less than 1 month).  Ongoing  GOAL:   Patient will meet greater than or equal to 90% of their needs  Progressing  MONITOR:   PO intake, Supplement acceptance, Diet advancement, Skin, Labs, Weight trends  ASSESSMENT:   83 year old male with PMH significant for seizures on Keppra. Pt admitted to [facility] on 2/16 with L SDH s/p crani 2/14. Pt was discharged 2/16 but readmitted that same day with AMS/aphasia and afib with RVR. Pt had Cortrak placed on 2/19 for enteral nutrition. Pt had MBS 2/24 with recommendations for Dysphagia 2 diet with thin liquids. Pt admitted to CIR on 2/24.  Cortrak removed 2/25.   Spoke with patient's wife, pt was in bed awake but was still having difficulty with conversation.  Pt wife reports that PTA he was having 3 full meals a day and had no issues chewing/swallowing or any episodes of n/v, diarrhea or constipation. She reported that he had a good appetite PTA. He enjoys yogurt, vegetables, sweets (cakes, ice cream), and meat such as chicken and pork.   Wife reported a UBW of 160#, she says she can tell he has lost wt. Pt is currently at 140#; per chart pt has experienced a 9% wt loss in 1 month which is significant for the time frame. Per meal completion in chart, pt is consuming anywhere from 25-100%. Wife was skeptical about him consuming 100% and believes it is probably around  75%.   Pt enjoys magic cups with meals and is willing to continue the Ensure and Prostat. Pt does mention that the Ensures are sweet, wife says that he will sip on them. Wife and pt are both looking forward to diet advancement.   Medications reviewed and include: B complex w/ vit C, colace 100mg  2x, Fibercon 625mg  daily, miralax 17g, senokot 8.6mg  2x Labs reviewed.  Diet Order:   Diet Order            DIET DYS 2 Room service appropriate? Yes; Fluid consistency: Thin  Diet effective now              EDUCATION NEEDS:   Not appropriate for education at this time  Skin:  Skin Assessment: (closed incision to head)  Last BM:  3/3  Height:   Ht Readings from Last 1 Encounters:  03/20/18 5\' 11"  (1.803 m)    Weight:   Wt Readings from Last 1 Encounters:  03/27/18 63.7 kg    Ideal Body Weight:  78.2 kg  BMI:  Body mass index is 19.59 kg/m.  Estimated Nutritional Needs:   Kcal:  1850-2050  Protein:  90-105 grams  Fluid:  >/= 1.8 L    NIKE

## 2018-03-28 NOTE — Progress Notes (Signed)
Occupational Therapy Session Note  Patient Details  Name: Alvin Hudson MRN: [id number] Date of Birth: 1935/07/31  Today's Date: 03/28/2018 OT Individual Time: 1440-1535 OT Individual Time Calculation (min): 55 min    Short Term Goals: Week 2:  OT Short Term Goal 1 (Week 2): Pt will complete toilet transfers with min A OT Short Term Goal 2 (Week 2): Pt will stand for 1 grooming task with min A for balance OT Short Term Goal 3 (Week 2): Pt will complete 1/3 toileting steps  Skilled Therapeutic Interventions/Progress Updates:    Treatment session with focus on activity tolerance, dynamic standing balance, and functional use of BUE.  Pt received supine in bed asleep.  Pt's wife and daughter asking questions about his fatigue with therapist discussing typical recovery.  Pt required tactile cues and increased time to fully awaken.  Completed bed mobility with supervision and donned shoes seated EOB with CGA and setup.  Pt completed transfer to w/c with min assist.  Engaged in sit > stand with therapist providing cues for hand placement.  Pt tolerated standing for ~5 mins at at time while completing table top task.  CGA for standing balance with intermittent cues for weight shift. Pt utilizing RUE approx 50% of time with increased time when picking up checker pieces.  Pt reports need to toilet.  Returned to room and completed toilet transfer and toileting with min assist.  Pt completed hand hygiene in standing at sink with min assist.  Returned to bed min assist squat pivot and left semi-reclined with all needs in reach.  Therapy Documentation Precautions:  Precautions Precautions: Fall Restrictions Weight Bearing Restrictions: No General:   Vital Signs: Therapy Vitals Temp: 98.8 F (37.1 C) Temp Source: Oral Pulse Rate: 76 Resp: 19 BP: (!) 89/47(rn notified) Patient Position (if appropriate): Lying Oxygen Therapy SpO2: 97 % O2 Device: Room Air Pain:  Pt with no c/o  pain   Therapy/Group: Individual Therapy  Rosalio Loud 03/28/2018, 3:53 PM

## 2018-03-28 NOTE — Progress Notes (Signed)
Speech Language Pathology Weekly Progress and Session Note  Patient Details  Name: Alvin Hudson MRN: [id number] Date of Birth: Aug 27, 1935  Beginning of progress report period: March 21, 2018 End of progress report period: March 28, 2018  Today's Date: 03/28/2018 SLP Individual Time: 0820-0900 SLP Individual Time Calculation (min): 40 min  Short Term Goals: Week 1: SLP Short Term Goal 1 (Week 1): Patient will consume current diet with minimal overt s/s of aspiration and Mod A verbal cues for use of swallowing compensatory strategies.  SLP Short Term Goal 1 - Progress (Week 1): Met SLP Short Term Goal 2 (Week 1): Patient will demonstrate sustained attention to functional tasks for ~15 minutes with Min A verbal cues for redirection.  SLP Short Term Goal 2 - Progress (Week 1): Met SLP Short Term Goal 3 (Week 1): Patient will self-monitor and correct verbal errors with Mod A multimodal cues.  SLP Short Term Goal 3 - Progress (Week 1): Met SLP Short Term Goal 4 (Week 1): Patient will verbalize wants/needs at the word level with Mod A multimodal cues.  SLP Short Term Goal 4 - Progress (Week 1): Met    New Short Term Goals: Week 2: SLP Short Term Goal 1 (Week 2): Patient will consume trials of Dys. 3 textures with efficient mastication and complete oral clearance without overt s/s of aspiration with Min A verbal cues over 2 sessions prior to upgrade.  SLP Short Term Goal 2 (Week 2): Patient will consume current diet with minimal overt s/s of aspiration with Min A verbal cues for use of swallowing strategies.  SLP Short Term Goal 3 (Week 2): Patient will demonstrate selective attention to functional tasks in a minimally distracting enviornment for ~30 minutes with Min A verbal cues for redirection.  SLP Short Term Goal 4 (Week 2): Patient will demonstrate functional problem solving for basic and familiar tasks with Min A verbal cues.  SLP Short Term Goal 5 (Week 2): Patient will self-monitor and  correct errors at the phrase and sentence level with Min A verbal cues.  SLP Short Term Goal 6 (Week 2): Patient will utulize word-finding strategies at the phrase level with Min A mulitmodal cues.   Weekly Progress Updates: Patient has made functional gains and has met 4 of 4 STGs this reporting period. Currently, patient is consuming Dys. 2 textures with thin liquids with minimal overt s/s of aspiration and Mod A verbal cues for use of swallowing compensatory strategies. Patient is also consuming trials of Dys. 3 textures with plans to upgrade later this week. Patient demonstrates improved cognitive-linguistic functioning with improved attention to tasks and problem solving. Patient also demonstrates improved word-finding with increased ability to express wants/needs at the phrase level. Patient and family education ongoing. Patient would benefit from continued skilled SLP intervention to maximize his cognitive-linguistic and swallowing function prior to discharge.      Intensity: Minumum of 1-2 x/day, 30 to 90 minutes Frequency: 3 to 5 out of 7 days Duration/Length of Stay: 04/11/18 Treatment/Interventions: Cognitive remediation/compensation;Environmental controls;Internal/external aids;Speech/Language facilitation;Therapeutic Activities;Patient/family education;Functional tasks;Dysphagia/aspiration precaution training;Cueing hierarchy   Daily Session  Skilled Therapeutic Interventions: Skilled treatment session focused on dysphagia and cognitive goals. SLP facilitated session by providing skilled observation with trials of Dys. 3 textures. Patient demonstrated efficient mastication without overt s/s of aspiration with supervision verbal cues. Recommend trial tray prior to upgrade. SLP also facilitated session by providing extra time and Min A verbal cues for functional problem solving during a basic money management task. Patient  left upright in bed with all needs within reach and wife present.  Continue with current plan of care.      Pain No/Denies Pain   Therapy/Group: Individual Therapy  Felina Tello 03/28/2018, 3:18 PM

## 2018-03-28 NOTE — Progress Notes (Signed)
Occupational Therapy Session Note  Patient Details  Name: Alvin Hudson MRN: [id number] Date of Birth: 02/19/1935  Today's Date: 03/28/2018 OT Individual Time: 0930-1015 OT Individual Time Calculation (min): 45 min    Short Term Goals: Week 1:  OT Short Term Goal 1 (Week 1): Pt will complete toilet transfer with min A OT Short Term Goal 1 - Progress (Week 1): Not met OT Short Term Goal 2 (Week 1): Pt will complete LB dressing with mod A OT Short Term Goal 2 - Progress (Week 1): Met OT Short Term Goal 3 (Week 1): Pt will tolerate standing for 3 mins in preparation for BADL tasks OT Short Term Goal 3 - Progress (Week 1): Not met Week 2:  OT Short Term Goal 1 (Week 2): Pt will complete toilet transfers with min A OT Short Term Goal 2 (Week 2): Pt will stand for 1 grooming task with min A for balance OT Short Term Goal 3 (Week 2): Pt will complete 1/3 toileting steps  Skilled Therapeutic Interventions/Progress Updates:    1:1 Pt was on toilet when arrived. Wife assisted to cutting pt's remaining hair to make more even. Pt transitioned from toilet to shower stall with chair with min A HHA. Pt bathed with min A - declining use of wash cloth initially . Pt with difficult with manipulating the washcloth with   Right hand due to decr coordination. Pt ambulated to the sink, to dress sitting in w/c. Pt with difficulty orienting shirt; held the shirt up and encouraged pt to start at the bottom of shirt - with holding up the shirt pt able to Rolly. Pt able to thread pants with cues. Pt donned right LE first but missed threading his left LE trying to Zevin his left shoe and stand up to pull up pants with only right Le threaded. Pt required max A to correct. Pt performed min A transfer into bed to rest at end of session.   Therapy Documentation Precautions:  Precautions Precautions: Fall Restrictions Weight Bearing Restrictions: No Pain:  no c/o pain in session just fatigue.   Therapy/Group: Individual  Therapy  Willeen Cass [facility] 03/28/2018, 2:18 PM

## 2018-03-28 NOTE — Progress Notes (Signed)
Physical Therapy Weekly Progress Note  Patient Details  Name: Alvin Hudson MRN: [id number] Date of Birth: March 30, 1935  Beginning of progress report period: March 21, 2018 End of progress report period: March 28, 2018    Patient has met 1 of 2 short term goals.  Pt was progressing well with mobility but in last 3 days pt has regressed to need mod A for gait and transfers. Pt improving communication and cognition but with continued impairments in mobility.  Patient continues to demonstrate the following deficits muscle weakness, unbalanced muscle activation, decreased coordination and decreased motor planning, decreased initiation, decreased attention, decreased awareness, decreased problem solving, decreased safety awareness, decreased memory and delayed processing and decreased standing balance, decreased postural control and decreased balance strategies and therefore will continue to benefit from skilled PT intervention to increase functional independence with mobility.  Patient progressing toward long term goals..  Continue plan of care.  PT Short Term Goals Week 1:  PT Short Term Goal 1 (Week 1): pt will perform functional transfers with min A PT Short Term Goal 1 - Progress (Week 1): Progressing toward goal PT Short Term Goal 2 (Week 1): pt will attend to therapeutic task x 2 minutes with min A PT Short Term Goal 2 - Progress (Week 1): Met Week 2:  PT Short Term Goal 1 (Week 2): pt will perform functional transfers with min A PT Short Term Goal 2 (Week 2): pt will perform gait in controlled environment x 100' with min A  Skilled Therapeutic Interventions/Progress Updates:  Ambulation/gait training;Community reintegration;DME/adaptive equipment instruction;Stair training;UE/LE Strength taining/ROM;Wheelchair propulsion/positioning;Balance/vestibular training;Discharge planning;Functional electrical stimulation;Pain management;Therapeutic Activities;UE/LE Coordination activities;Therapeutic  Exercise;Splinting/orthotics;Patient/family education;Functional mobility training;Cognitive remediation/compensation;Neuromuscular re-education     DONAWERTH,KAREN 03/28/2018, 8:08 AM

## 2018-03-28 NOTE — Progress Notes (Signed)
[place] PHYSICAL MEDICINE & REHABILITATION PROGRESS NOTE  Subjective/Complaints: Has had an intermittent cough, minimally productive. Mildly constipated  ROS: Patient denies fever, rash, sore throat, blurred vision, nausea, vomiting, diarrhea, cough, shortness of breath or chest pain, joint or back pain, headache, or mood change.   Objective: Vital Signs: Blood pressure 120/70, pulse 76, temperature 98.9 F (37.2 C), temperature source Oral, resp. rate 18, height 5\' 11"  (1.803 m), weight 63.7 kg, SpO2 98 %. No results found. No results for input(s): WBC, HGB, HCT, PLT in the last 72 hours. Recent Labs    03/27/18 0830  NA 137  K 3.8  CL 99  CO2 32  GLUCOSE 111*  BUN 16  CREATININE 0.74  CALCIUM 9.1    Physical Exam: BP 120/70 (BP Location: Left Arm)   Pulse 76   Temp 98.9 F (37.2 C) (Oral)   Resp 18   Ht 5\' 11"  (1.803 m)   Wt 63.7 kg   SpO2 98%   BMI 19.59 kg/m  Constitutional: No distress . Vital signs reviewed. HEENT: EOMI, oral membranes moist Neck: supple Cardiovascular: RRR without murmur. No JVD    Respiratory: CTA Bilaterally without wheezes or rales. Normal effort    GI: BS +, non-tender, non-distended  Musculoskeletal: No edema or tenderness in extremities  Neurological: He is alert.  Global, expressive >> receptive aphasia--able to speak simple phrases and words with extra time---no changes today  Motor: Grossly 4-4+/5 throughout, needs extra time with right hand Skin: See above  Psychiatric: pleasant, mor animated today  Assessment/Plan: 1. Functional deficits secondary to left SDH which require 3+ hours per day of interdisciplinary therapy in a comprehensive inpatient rehab setting.  Physiatrist is providing close team supervision and 24 hour management of active medical problems listed below.  Physiatrist and rehab team continue to assess barriers to discharge/monitor patient progress toward functional and medical goals  Care  Tool:  Bathing  Bathing activity did not occur: (did not occur.) Body parts bathed by patient: Right arm, Chest, Abdomen, Front perineal area, Buttocks, Right upper leg, Left upper leg, Face, Left arm   Body parts bathed by helper: Right lower leg, Left lower leg, Left arm     Bathing assist Assist Level: Minimal Assistance - Patient > 75%     Upper Body Dressing/Undressing Upper body dressing Upper body dressing/undressing activity did not occur (including orthotics): (did not occur; pt was already in bed.) What is the patient wearing?: Pull over shirt    Upper body assist Assist Level: Minimal Assistance - Patient > 75%    Lower Body Dressing/Undressing Lower body dressing    Lower body dressing activity did not occur: (did not occur.) What is the patient wearing?: Incontinence brief, Pants     Lower body assist Assist for lower body dressing: Moderate Assistance - Patient 50 - 74%     Toileting Toileting    Toileting assist Assist for toileting: Moderate Assistance - Patient 50 - 74%     Transfers Chair/bed transfer  Transfers assist  Chair/bed transfer activity did not occur: (did not occur.)  Chair/bed transfer assist level: Minimal Assistance - Patient > 75%     Locomotion Ambulation   Ambulation assist      Assist level: Minimal Assistance - Patient > 75% Assistive device: Walker-rolling Max distance: 125   Walk 10 feet activity   Assist     Assist level: Minimal Assistance - Patient > 75% Assistive device: Walker-rolling   Walk 50 feet activity  Assist Walk 50 feet with 2 turns activity did not occur: Safety/medical concerns  Assist level: Minimal Assistance - Patient > 75% Assistive device: Walker-rolling    Walk 150 feet activity   Assist Walk 150 feet activity did not occur: Safety/medical concerns  Assist level: Moderate Assistance - Patient - 50 - 74% Assistive device: Walker-rolling    Walk 10 feet on uneven surface   activity   Assist Walk 10 feet on uneven surfaces activity did not occur: Safety/medical concerns         Wheelchair     Assist   Type of Wheelchair: Manual    Wheelchair assist level: Dependent - Patient 0% Max wheelchair distance: 25    Wheelchair 50 feet with 2 turns activity    Assist    Wheelchair 50 feet with 2 turns activity did not occur: Safety/medical concerns       Wheelchair 150 feet activity     Assist Wheelchair 150 feet activity did not occur: Safety/medical concerns          Medical Problem List and Plan: 1.  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 Status post left frontotemporal parietal craniotomy evacuation of subdural hematoma 03/10/2018  Continue CIR, PT OT speech  -team conf today 2.  Antithrombotics: -DVT/anticoagulation:  SCDs. No anticoagulation due to subdural hematoma             -antiplatelet therapy: Not applicable 3. Pain Management:  Hydrocodone as needed 4. Mood:  Zoloft 100 mg daily, Restoril 15 mg daily at bedtime             -antipsychotic agents: not applicable  -discussed reactive mood changes which can take place after TBI. Will continue to observe closely for now.  5. Neuropsych: This patient is not capable of making decisions on his own behalf. 6. Skin/Wound Care:  Routine skin checks 7. Fluids/Electrolytes/Nutrition:  Routine in and out's  BMP normal on 3/2 8. Seizure disorder. Keppra 1500 mg twice a day, Vimpat 200 mg twice a day, valproic acid 500 mg every 8 hours.   -Plan to continue this regimen to follow up outpatient with neurology services. 9. New-onset atrial fibrillation with RVR. Cardizem initiated 60 mg every 6 hours 03/16/2018. Cardiac rate control. No anticoagulation due to subdural hematoma. Follow-up cardiology services Dr. Garnette Scheuermann 10. Dysphagia. Dysphagia #2 thin liquids.   Follow-up modified barium swallow in the future  Advance diet per SLP, pt anxious to  upgrade 11. Constipatoin: sorbitol today   13.  Hypoalbuminemia  Supplement initiated on 2/25 14.  Anemia with macrocytosis  Hemoglobin 11.8 on 03/24/2018  Vit B12 WNL  Folate normal on 03/24/2018  LOS: 8 days A FACE TO FACE EVALUATION WAS PERFORMED  Ranelle Oyster 03/28/2018, 8:18 AM

## 2018-03-29 ENCOUNTER — Inpatient Hospital Stay (HOSPITAL_COMMUNITY): Payer: Medicare Other | Admitting: Occupational Therapy

## 2018-03-29 ENCOUNTER — Inpatient Hospital Stay (HOSPITAL_COMMUNITY): Payer: Medicare Other | Admitting: Speech Pathology

## 2018-03-29 ENCOUNTER — Inpatient Hospital Stay (HOSPITAL_COMMUNITY): Payer: Medicare Other

## 2018-03-29 MED ORDER — SENNOSIDES-DOCUSATE SODIUM 8.6-50 MG PO TABS
2.0000 | ORAL_TABLET | Freq: Every day | ORAL | Status: DC
  Administered 2018-03-29 – 2018-04-10 (×10): 2 via ORAL
  Filled 2018-03-29 (×12): qty 2

## 2018-03-29 NOTE — Progress Notes (Signed)
Occupational Therapy Session Note  Patient Details  Name: Alvin Hudson MRN: [id number] Date of Birth: 1935/12/26  Today's Date: 03/29/2018 OT Individual Time: 0865-7846 OT Individual Time Calculation (min): 60 min    Short Term Goals: Week 2:  OT Short Term Goal 1 (Week 2): Pt will complete toilet transfers with min A OT Short Term Goal 2 (Week 2): Pt will stand for 1 grooming task with min A for balance OT Short Term Goal 3 (Week 2): Pt will complete 1/3 toileting steps  Skilled Therapeutic Interventions/Progress Updates:    Pt received in bed with spouse in the room.  Pt agreeable to a shower this morning. Pt sat to EOB with close S and then needed cues to push up with B hands into stand vs pulling up on the RW.  Once pt came into stand with min A he was able to stand with S using B hands on the walker. Pt ambulated to the bathroom with min A and cues for walker management.  Pt pulled pants down using R hand actively and then sat on toilet to doff clothing over feet.  Pt stepped into shower with max cues on how to rotate body around to sit down.  Once in shower, pt actively used R hand to wash self and was able to bathe sitting but did need help to wash L foot.  In standing, pt demonstrated a posterior lean relying heavily on the grab bars for support.  Pt needed min A to support balance with one hand on bar and IF pt had removed both hands, it would not have been safe for him to stand.  Attempted several times to cue pt to soften knees and put more body weight over his toes, but pt not able to follow directions.  Once he dried off, he ambulated back to bed with RW.  Donned LB clothing with min A and improved postural control in standing but continued to need min A for balance.  Pt opted to just wear his non slip socks.   Discussed the need for him to work on his balance and postural control.  Recommended to his wife that she get him pull up briefs to use so it will be easier for him to manage with  his R hand.   Pt stood at sink with min A and some posterior leaning to brush teeth with set up A to open toothpaste.    Pt resting in w/c with belt alarm on and all needs met.  telesitter on.   Therapy Documentation Precautions:  Precautions Precautions: Fall Restrictions Weight Bearing Restrictions: No   Pain: Pain Assessment Pain Score: 0-No pain       Therapy/Group: Individual Therapy  [place] 03/29/2018, 11:58 AM

## 2018-03-29 NOTE — Progress Notes (Signed)
Occupational Therapy Session Note  Patient Details  Name: Alvin Hudson MRN: [id number] Date of Birth: 06-30-1935  Today's Date: 03/29/2018 OT Individual Time: 1030-1100 OT Individual Time Calculation (min): 30 min    Short Term Goals: Week 2:  OT Short Term Goal 1 (Week 2): Pt will complete toilet transfers with min A OT Short Term Goal 2 (Week 2): Pt will stand for 1 grooming task with min A for balance OT Short Term Goal 3 (Week 2): Pt will complete 1/3 toileting steps  Skilled Therapeutic Interventions/Progress Updates:    Patient asleep in bed agreeable to participate in therapy session but fatigue is evident t/o time together.  Bed mobility w/ CS.  Donn/doff shoes seated edge of bed w/ min A.  Ambulation on unit with hand hold assist min A with cues for pace, step length and posture - occ LOB forward requiring mod A to regain.  Standing balance activities completed with left UE support - min A to maintain balance and visual/gestural cues for sequence of activity.  Patient returned to bed at close of session with bed alarm set and call bell in reach.    Therapy Documentation Precautions:  Precautions Precautions: Fall Restrictions Weight Bearing Restrictions: No General:   Vital Signs: Therapy Vitals Pulse Rate: 62 BP: 120/75 Pain: Pain Assessment Pain Scale: 0-10 Pain Score: 0-No pain Other Treatments:     Therapy/Group: Individual Therapy  Barrie Lyme 03/29/2018, 12:34 PM

## 2018-03-29 NOTE — Patient Care Conference (Addendum)
Inpatient RehabilitationTeam Conference and Plan of Care Update Date: 03/28/2018   Time: 2:45 PM    Patient Name: Alvin Hudson      Medical Record Number: [id number]  Date of Birth: [birth year] Sex: Male         Room/Bed: 4W05C/4W05C-01 Payor Info: Payor: MEDICARE / Plan: MEDICARE PART A AND B / Product Type: *No Product type* /    Admitting Diagnosis: SDH  Admit Date/Time:  03/20/2018  3:51 PM Admission Comments: No comment available   Primary Diagnosis:  <principal problem not specified> Principal Problem: <principal problem not specified>  Patient Active Problem List   Diagnosis Date Noted  . Anemia of chronic disease   . Megaloblastic anemia   . Hypoalbuminemia due to protein-calorie malnutrition (HCC)   . Leukocytosis   . New onset atrial fibrillation (HCC)   . Seizures (HCC)   . 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 History of subdural hematoma   . Dysphagia   . Atrial fibrillation (HCC) 03/17/2018  . Aphasia 03/12/2018  . Seizure (HCC) 03/12/2018  . Subdural hematoma (HCC) 03/10/2018    Expected Discharge Date: Expected Discharge Date: 04/11/18  Team Members Present: Physician leading conference: Dr. Faith Rogue Social Worker Present: Amada Jupiter, LCSW Nurse Present: Other (comment)(Nate Elliot Gurney, RN) PT Present: Judieth Keens, PT OT Present: Kearney Hard, OT SLP Present: Feliberto Gottron, SLP PPS Coordinator present : Fae Pippin     Current Status/Progress Goal Weekly Team Focus  Medical   ongoing aphsia and dysphagia, ?underlying depression, nutrition picking up  improve language   heart rate control, nutrition, education, evaluating for increased depression   Bowel/Bladder   Incontinent of bowel and bladder. LBM 3/1  Regain regular pattern of bowel and bladder. Times toileting  Assess toileting needs qshift and PRN   Swallow/Nutrition/ Hydration   Dys. 2 textures with thin liquids, Min A  Supervision  trials of upgraded textures and use of  strategies    ADL's   min/mod overall  Supervision  R NMR, attention, apraxia, modified bathing/dressing   Mobility   mod A transfers and gait with RW  supervision overall  attention, activity tolerance, balance, gait   Communication   Mod A  Min A  verbal expression, awareness of errors    Safety/Cognition/ Behavioral Observations  Mod A  Min A  attention, awareness, problem solving    Pain   Denies pain  remain pain free  assess pain qshift and PRN   Skin   Rash to back improving.. Surgical incision to head healed.  Keep area clean and dry. encourage patient to turn and reposition frequently while in bed.  assess skin qshift and PRN    Rehab Goals Patient on target to meet rehab goals: Yes *See Care Plan and progress notes for long and short-term goals.     Barriers to Discharge  Current Status/Progress Possible Resolutions Date Resolved   Physician    Medical stability;Behavior        continue medical mgt as outlined in the chart      Nursing                  PT                    OT                  SLP                SW  Discharge Planning/Teaching Needs:  Plan for pt to d/c home with wife to provide 24/7 supervision.  Teaching needs TBD.   Team Discussion:  No significant medical issues;  Aphasia and ataxic but showing increased use of right side.  Cont b/b.  Min- mod assist overall with PT/OT/ST.  Speech improving daily.  Mood a concern - monitor and refer for neuropsychology when appropriate.  Easily fatigues but getting better.  Revisions to Treatment Plan:  NA    Continued Need for Acute Rehabilitation Level of Care: The patient requires daily medical management by a physician with specialized training in physical medicine and rehabilitation for the following conditions: Daily direction of a multidisciplinary physical rehabilitation program to ensure safe treatment while eliciting the highest outcome that is of practical value to the patient.:  Yes Daily medical management of patient stability for increased activity during participation in an intensive rehabilitation regime.: Yes Daily analysis of laboratory values and/or radiology reports with any subsequent need for medication adjustment of medical intervention for : Cardiac problems;Neurological problems;Mood/behavior problems   I attest that I was present, lead the team conference, and concur with the assessment and plan of the team.   Corian Handley 03/29/2018, 4:15 PM

## 2018-03-29 NOTE — Progress Notes (Signed)
[name] PHYSICAL MEDICINE & REHABILITATION PROGRESS NOTE  Subjective/Complaints: Finishing up breakfast when I came in. Was restless, a little disoriented last night per wife.   ROS: limited due to language/communication   Objective: Vital Signs: Blood pressure 103/80, pulse 69, temperature 97.6 F (36.4 C), resp. rate 17, height 5\' 11"  (1.803 m), weight 63.7 kg, SpO2 100 %. No results found. No results for input(s): WBC, HGB, HCT, PLT in the last 72 hours. Recent Labs    03/27/18 0830  NA 137  K 3.8  CL 99  CO2 32  GLUCOSE 111*  BUN 16  CREATININE 0.74  CALCIUM 9.1    Physical Exam: BP 103/80 (BP Location: Left Arm)   Pulse 69   Temp 97.6 F (36.4 C)   Resp 17   Ht 5\' 11"  (1.803 m)   Wt 63.7 kg   SpO2 100%   BMI 19.59 kg/m  Constitutional: No distress . Vital signs reviewed. HEENT: EOMI, oral membranes moist Neck: supple Cardiovascular: RRR without murmur. No JVD    Respiratory: CTA Bilaterally without wheezes or rales. Normal effort    GI: BS +, non-tender, non-distended  Musculoskeletal: No edema or tenderness in extremities  Neurological: He is alert.  Global, expressive >> receptive aphasia--a little more difficulty with word finding today. Motor: Grossly 4-4+/5 throughout, apraxic with RUE Skin: See above  Psychiatric: a little more flat today  Assessment/Plan: 1. Functional deficits secondary to left SDH which require 3+ hours per day of interdisciplinary therapy in a comprehensive inpatient rehab setting.  Physiatrist is providing close team supervision and 24 hour management of active medical problems listed below.  Physiatrist and rehab team continue to assess barriers to discharge/monitor patient progress toward functional and medical goals  Care Tool:  Bathing  Bathing activity did not occur: (did not occur.) Body parts bathed by patient: Right arm, Chest, Abdomen, Front perineal area, Buttocks, Right upper leg, Left upper leg, Face, Left  arm   Body parts bathed by helper: Right lower leg, Left lower leg     Bathing assist Assist Level: Minimal Assistance - Patient > 75%     Upper Body Dressing/Undressing Upper body dressing Upper body dressing/undressing activity did not occur (including orthotics): (did not occur; pt was already in bed.) What is the patient wearing?: Pull over shirt    Upper body assist Assist Level: Supervision/Verbal cueing    Lower Body Dressing/Undressing Lower body dressing    Lower body dressing activity did not occur: (did not occur.) What is the patient wearing?: Incontinence brief, Pants     Lower body assist Assist for lower body dressing: Minimal Assistance - Patient > 75%     Toileting Toileting    Toileting assist Assist for toileting: Minimal Assistance - Patient > 75%     Transfers Chair/bed transfer  Transfers assist  Chair/bed transfer activity did not occur: (did not occur.)  Chair/bed transfer assist level: Minimal Assistance - Patient > 75%     Locomotion Ambulation   Ambulation assist      Assist level: Minimal Assistance - Patient > 75% Assistive device: Walker-rolling Max distance: 125   Walk 10 feet activity   Assist     Assist level: Minimal Assistance - Patient > 75% Assistive device: Walker-rolling   Walk 50 feet activity   Assist Walk 50 feet with 2 turns activity did not occur: Safety/medical concerns  Assist level: Minimal Assistance - Patient > 75% Assistive device: Walker-rolling    Walk 150 feet activity  Assist Walk 150 feet activity did not occur: Safety/medical concerns  Assist level: Moderate Assistance - Patient - 50 - 74% Assistive device: Walker-rolling    Walk 10 feet on uneven surface  activity   Assist Walk 10 feet on uneven surfaces activity did not occur: Safety/medical concerns         Wheelchair     Assist   Type of Wheelchair: Manual    Wheelchair assist level: Dependent - Patient 0% Max  wheelchair distance: 25    Wheelchair 50 feet with 2 turns activity    Assist    Wheelchair 50 feet with 2 turns activity did not occur: Safety/medical concerns       Wheelchair 150 feet activity     Assist Wheelchair 150 feet activity did not occur: Safety/medical concerns          Medical Problem List and Plan: 1.  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 Status post left frontotemporal parietal craniotomy evacuation of subdural hematoma 03/10/2018  Continue CIR, PT OT speech  -wife concerned about decreased language this morning. He didn't sleep well last night, so i'm not really surprised. I encouraged her to give him some time, and likely with a reasonable night's sleep, he'll be back on track tomorrow, if he's not sooner  2.  Antithrombotics: -DVT/anticoagulation:  SCDs. No anticoagulation due to subdural hematoma             -antiplatelet therapy: Not applicable 3. Pain Management:  Hydrocodone as needed 4. Mood:  Zoloft 100 mg daily, Restoril 15 mg daily at bedtime             -antipsychotic agents: not applicable  -team to provide egosupport as possible  5. Neuropsych: This patient is not capable of making decisions on his own behalf. 6. Skin/Wound Care:  Routine skin checks 7. Fluids/Electrolytes/Nutrition:  Routine in and out's  BMP normal on 3/2 8. Seizure disorder. Keppra 1500 mg twice a day, Vimpat 200 mg twice a day, valproic acid 500 mg every 8 hours.   -Plan to continue this regimen to follow up outpatient with neurology services. 9. New-onset atrial fibrillation with RVR. Cardizem initiated 60 mg every 6 hours 03/16/2018. Cardiac rate control. No anticoagulation due to subdural hematoma. Follow-up cardiology services Dr. Garnette Scheuermann 10. Dysphagia. Dysphagia #2 thin liquids.   Follow-up modified barium swallow in the future  Advance diet per SLP,  11. Constipatoin: sorbitol 3/3 with results  -change regimen to senkot-s  2 tabs at hs 13.  Hypoalbuminemia  Supplement initiated on 2/25 14.  Anemia with macrocytosis  Hemoglobin 11.8 on 03/24/2018  Vit B12 WNL  Folate normal on 03/24/2018  LOS: 9 days A FACE TO FACE EVALUATION WAS PERFORMED  Ranelle Oyster 03/29/2018, 8:39 AM

## 2018-03-29 NOTE — Progress Notes (Signed)
Physical Therapy Session Note  Patient Details  Name: Alvin Hudson MRN: [id number] Date of Birth: 25-Sep-1935  Today's Date: 03/29/2018 PT Individual Time: 8016-5537 PT Individual Time Calculation (min): 50 min   Short Term Goals: Week 2:  PT Short Term Goal 1 (Week 2): pt will perform functional transfers with min A PT Short Term Goal 2 (Week 2): pt will perform gait in controlled environment x 100' with min A  Skilled Therapeutic Interventions/Progress Updates:     Patient in bed upon PT arrival. Patient alert and agreeable to PT session.  Patient reported feeling very tired at beginning of session, however agreed to participate in therapy. Patient required supervision for supine to sit and min A for transfer from bed to w/c with B HHA. Patient was transported to the gym and performed step taps while counting sequentially from 1-10 at the 6 inch step using B rails with CGA for safety. Required cues for 4/10 numbers and prompting to say each number, frequently repeating numbers several times. Patient performed sit to/from stand transfers from the w/c  before and after activity with min A using a RW. Required heavy cues for sequencing of hand placement and scooting forward in the chair before standing. During seated rest break patient was asked the day of the week and was unable to provide a response on his own, required several options before stating "yesterday was Tuesday, so Wednesday."   Patient requested to have a BM and was transported back to his room in the w/c. Required min A for stand-step transfers from the w/c to/and from the toiled ambulating 10 feet min assist to and from with RW. Ambulated with shuffling gait, forward trunk lean, downward head gaze, and would reach for grab bars while ambulating in the bathroom, requiring several cues to hold onto the RW for safety. Patient assisted 50% with toileting including clothing management and wiping. He stood at the sink to wash hands with CGA  for safety with 1 or no UE support. Once returned to the w/c patient performed sit to/from stand following instructions from PT on sequencing and demonstrated improvement with sequencing requiring fewer verbal cues for hand placement and scooting forward in the w/c. Following sit to stand patient presented with pallor complexion and reported he felt very tired. Vitals were assessed and WFL, see below. Patient performed stand pivot transfer with B HHA requiring min A for physical support from the w/c to the bed and supervision for sit to supine.      Patient in bed at end of session with breaks locked, bed alarm set, and all needs within reach.   Note: Shuffling gait presented this week, suggest assessing ankle ROM and heel cord flexibility next session.    Therapy Documentation Precautions:  Precautions Precautions: Fall Restrictions Weight Bearing Restrictions: No General:   Vital Signs: Therapy Vitals Temp: 97.8 F (36.6 C) Pulse Rate: 73 Resp: 18 BP: 107/70 Patient Position (if appropriate): Lying Oxygen Therapy SpO2: 97 % O2 Device: Room Air Pain: Pain Assessment Pain Scale: 0-10 Pain Score: 0-No pain   Therapy/Group: Individual Therapy  Helayne Seminole, PT, DPT 03/29/2018, 4:27 PM

## 2018-03-29 NOTE — Progress Notes (Signed)
Social Work Patient ID: Alvin Hudson, male   DOB: 04/01/1935, 82 y.o.   MRN: [id number]   Met with pt, wife and daughter yesterday following team conference.  Wife and daughter aware and agreeable with targeted d/c date of 3/17 and supervision goals.  Pt was upset with date and looks surprised and disappointed - was hoping for much earlier d/c.  Explained to pt that this is a target and, if he reaches supervision goals sooner, then team will consider change in date.  Reviewed d/c planning issues with all and answered planning questions of wife and daughter.  Wife remains very involved and supportive.  Will continue to follow.  , , LCSW 

## 2018-03-29 NOTE — Progress Notes (Signed)
Speech Language Pathology Daily Session Note  Patient Details  Name: Alvin Hudson MRN: [id number] Date of Birth: 26-Apr-1935  Today's Date: 03/29/2018 SLP Individual Time: 1300-1400 SLP Individual Time Calculation (min): 60 min  Short Term Goals: Week 2: SLP Short Term Goal 1 (Week 2): Patient will consume trials of Dys. 3 textures with efficient mastication and complete oral clearance without overt s/s of aspiration with Min A verbal cues over 2 sessions prior to upgrade.  SLP Short Term Goal 2 (Week 2): Patient will consume current diet with minimal overt s/s of aspiration with Min A verbal cues for use of swallowing strategies.  SLP Short Term Goal 3 (Week 2): Patient will demonstrate selective attention to functional tasks in a minimally distracting enviornment for ~30 minutes with Min A verbal cues for redirection.  SLP Short Term Goal 4 (Week 2): Patient will demonstrate functional problem solving for basic and familiar tasks with Min A verbal cues.  SLP Short Term Goal 5 (Week 2): Patient will self-monitor and correct errors at the phrase and sentence level with Min A verbal cues.  SLP Short Term Goal 6 (Week 2): Patient will utulize word-finding strategies at the phrase level with Min A mulitmodal cues.   Skilled Therapeutic Interventions: Skilled treatment session focused on dysphagia and communication goals. SLP facilitated session by providing skilled observation with lunch meal of Dys. 3 textures with thin liquids. Patient demonstrated efficient mastication with complete oral clearance without overt s/s of aspiration. Therefore, recommend patient upgrade to Dys. 3 textures. SLP also facilitated session by providing extra time and Min A verbal cues for patient to self-monitor and correct verbal errors during informal conversation. Patient transferred back to bed at end of session and left with alarm on and all needs within reach. Continue with current plan of care.        Pain Pain  Assessment Pain Scale: 0-10 Pain Score: 0-No pain  Therapy/Group: Individual Therapy  Shona Pardo 03/29/2018, 3:03 PM

## 2018-03-30 ENCOUNTER — Inpatient Hospital Stay (HOSPITAL_COMMUNITY): Payer: Medicare Other | Admitting: Physical Therapy

## 2018-03-30 ENCOUNTER — Inpatient Hospital Stay (HOSPITAL_COMMUNITY): Payer: Medicare Other | Admitting: Occupational Therapy

## 2018-03-30 ENCOUNTER — Inpatient Hospital Stay (HOSPITAL_COMMUNITY): Payer: Medicare Other | Admitting: Speech Pathology

## 2018-03-30 NOTE — Progress Notes (Addendum)
Physical Therapy Session Note  Patient Details  Name: Alvin Hudson MRN: [id number] Date of Birth: [birth year]  Today's Date: 03/30/2018 PT Individual Time: 0730-0826 and [phone] PT Individual Time Calculation (min): 56 min and 30 min  Short Term Goals: Week 2:  PT Short Term Goal 1 (Week 2): pt will perform functional transfers with min A PT Short Term Goal 2 (Week 2): pt will perform gait in controlled environment x 100' with min A  Skilled Therapeutic Interventions/Progress Updates:    pt performs bed mobility with supervision and sitting balance edge of bed with supervision with set up to [place] socks and shoes.  Gait training 150' x 3 throughout session with CGA, much improved posture and step length this session.  Stair negotiation 12 steps with bilat handrails x 2 with cues for sequencing.  Standing squat and reach task with bean bag toss game without AD with pt requiring min A for standing balance. Pt with much improved communication this session, improving activity tolerance.  Session 2: pt with no c/o pain.  Pt just receiving breakfast tray. Pt performs gait to day room with RW with CGA, much improved posture and step length.  Self feeding with hand over hand assist, cues for small bites and locating items on the tray.  Pt left with nurse tech at end of session.  Therapy Documentation Precautions:  Precautions Precautions: Fall Restrictions Weight Bearing Restrictions: No Pain:  no c/o pain   Therapy/Group: Individual Therapy  Jillienne Egner 03/30/2018, 8:30 AM

## 2018-03-30 NOTE — Progress Notes (Signed)
Speech Language Pathology Daily Session Note  Patient Details  Name: Alvin Hudson MRN: [id number] Date of Birth: [birth year]  Today's Date: 03/30/2018 SLP Individual Time: 1400-1455 SLP Individual Time Calculation (min): 55 min  Short Term Goals: Week 2: SLP Short Term Goal 1 (Week 2): Patient will consume trials of Dys. 3 textures with efficient mastication and complete oral clearance without overt s/s of aspiration with Min A verbal cues over 2 sessions prior to upgrade.  SLP Short Term Goal 2 (Week 2): Patient will consume current diet with minimal overt s/s of aspiration with Min A verbal cues for use of swallowing strategies.  SLP Short Term Goal 3 (Week 2): Patient will demonstrate selective attention to functional tasks in a minimally distracting enviornment for ~30 minutes with Min A verbal cues for redirection.  SLP Short Term Goal 4 (Week 2): Patient will demonstrate functional problem solving for basic and familiar tasks with Min A verbal cues.  SLP Short Term Goal 5 (Week 2): Patient will self-monitor and correct errors at the phrase and sentence level with Min A verbal cues.  SLP Short Term Goal 6 (Week 2): Patient will utulize word-finding strategies at the phrase level with Min A mulitmodal cues.   Skilled Therapeutic Interventions: Skilled treatment session focused on dysphagia and cognitive goals. Upon arrival, patient was completing his lunch meal of Dys. 3 textures with thin liquids with his wife providing supervision. Patient consumed remainder of meal without overt s/s of aspiration with verbal cues needed for self-feeding. SLP also facilitated session by providing extra time and Min A verbal cues for word-finding at the sentence level during a verbal description task/game (headbanz). Patient was transferred back to bed at end of session and left with alarm on. Continue with current plan of care.      Pain No/Denies Pain   Therapy/Group: Individual Therapy  Fletcher Ostermiller,  Demetrius Mahler 03/30/2018, 3:05 PM

## 2018-03-30 NOTE — Progress Notes (Signed)
Occupational Therapy Session Note  Patient Details  Name: Alvin Hudson MRN: [id number] Date of Birth: [birth year]  Today's Date: 03/30/2018 OT Individual Time: 1300-1345 OT Individual Time Calculation (min): 45 min   Short Term Goals: Week 2:  OT Short Term Goal 1 (Week 2): Pt will complete toilet transfers with min A OT Short Term Goal 2 (Week 2): Pt will stand for 1 grooming task with min A for balance OT Short Term Goal 3 (Week 2): Pt will complete 1/3 toileting steps  Skilled Therapeutic Interventions/Progress Updates:    Pt greeted semi-reclined in bed, declined any bathing/dressing today but agreeable to OT treatment session. Pt had not eaten lunch so treatment session focused on self-feeding and functional use of R UE. Had pt read out food items on receipt and point to the items on his tray. Provided pt with built up handle for spoon to eat soup. Worked on functional pincer grasp of feeding utensils. Pt needed hand over hand a 20% of the time to obtain appropriate bites and maintain food bolus on spoon. Worked on wrist pronation to then tilt food into mouth and not away from mouth. Pt ambulated 50 ft w. RW and CGA. Pt returned to room and left in care of spouse to finish eating lunch.   Therapy Documentation Precautions:  Precautions Precautions: Fall Restrictions Weight Bearing Restrictions: No Pain:   none/denies pain  Therapy/Group: Individual Therapy  Mal Amabile 03/30/2018, 1:51 PM

## 2018-03-30 NOTE — Progress Notes (Signed)
[place] PHYSICAL MEDICINE & REHABILITATION PROGRESS NOTE  Subjective/Complaints: Vomited yesterday, ?restless night. Didn't take meds or much PO.   ROS: limited due to lethargy.   Objective: Vital Signs: Blood pressure 107/83, pulse 70, temperature 97.7 F (36.5 C), temperature source Oral, resp. rate 16, height 5\' 11"  (1.803 m), weight 65 kg, SpO2 99 %. No results found. No results for input(s): WBC, HGB, HCT, PLT in the last 72 hours. No results for input(s): NA, K, CL, CO2, GLUCOSE, BUN, CREATININE, CALCIUM in the last 72 hours.  Physical Exam: BP 107/83 (BP Location: Left Arm)   Pulse 70   Temp 97.7 F (36.5 C) (Oral)   Resp 16   Ht 5\' 11"  (1.803 m)   Wt 65 kg   SpO2 99%   BMI 19.99 kg/m  Constitutional: No distress . Vital signs reviewed. HEENT: EOMI, oral membranes moist Neck: supple Cardiovascular: RRR without murmur. No JVD    Respiratory: CTA Bilaterally without wheezes or rales. Normal effort    GI: BS +, non-tender, non-distended  Musculoskeletal: No edema or tenderness in extremities  Neurological: alert, more spontaneous language.  Motor: Grossly 4+/5 throughout, apraxic with RUE Skin: See above  Psychiatric:alert  Assessment/Plan: 1. Functional deficits secondary to left SDH which require 3+ hours per day of interdisciplinary therapy in a comprehensive inpatient rehab setting.  Physiatrist is providing close team supervision and 24 hour management of active medical problems listed below.  Physiatrist and rehab team continue to assess barriers to discharge/monitor patient progress toward functional and medical goals  Care Tool:  Bathing  Bathing activity did not occur: (did not occur.) Body parts bathed by patient: Right arm, Chest, Abdomen, Front perineal area, Buttocks, Right upper leg, Left upper leg, Face, Left arm, Right lower leg   Body parts bathed by helper: Left lower leg     Bathing assist Assist Level: Minimal Assistance - Patient >  75%     Upper Body Dressing/Undressing Upper body dressing Upper body dressing/undressing activity did not occur (including orthotics): (did not occur; pt was already in bed.) What is the patient wearing?: Pull over shirt    Upper body assist Assist Level: Supervision/Verbal cueing    Lower Body Dressing/Undressing Lower body dressing    Lower body dressing activity did not occur: (did not occur.) What is the patient wearing?: Incontinence brief, Pants     Lower body assist Assist for lower body dressing: Minimal Assistance - Patient > 75%     Toileting Toileting    Toileting assist Assist for toileting: Minimal Assistance - Patient > 75%     Transfers Chair/bed transfer  Transfers assist  Chair/bed transfer activity did not occur: (did not occur.)  Chair/bed transfer assist level: Minimal Assistance - Patient > 75%     Locomotion Ambulation   Ambulation assist      Assist level: Minimal Assistance - Patient > 75% Assistive device: Walker-rolling Max distance: 10   Walk 10 feet activity   Assist     Assist level: Minimal Assistance - Patient > 75% Assistive device: Walker-rolling   Walk 50 feet activity   Assist Walk 50 feet with 2 turns activity did not occur: Safety/medical concerns  Assist level: Minimal Assistance - Patient > 75% Assistive device: Walker-rolling    Walk 150 feet activity   Assist Walk 150 feet activity did not occur: Safety/medical concerns  Assist level: Moderate Assistance - Patient - 50 - 74% Assistive device: Walker-rolling    Walk 10 feet on uneven  surface  activity   Assist Walk 10 feet on uneven surfaces activity did not occur: Safety/medical concerns         Wheelchair     Assist   Type of Wheelchair: Manual    Wheelchair assist level: Dependent - Patient 0% Max wheelchair distance: 25    Wheelchair 50 feet with 2 turns activity    Assist    Wheelchair 50 feet with 2 turns activity did  not occur: Safety/medical concerns       Wheelchair 150 feet activity     Assist Wheelchair 150 feet activity did not occur: Safety/medical concerns          Medical Problem List and Plan: 1.  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 Status post left frontotemporal parietal craniotomy evacuation of subdural hematoma 03/10/2018  Continue CIR, PT OT speech  -pt much brighter today. May wax and wane depending upon quality of sleep  2.  Antithrombotics: -DVT/anticoagulation:  SCDs. No anticoagulation due to subdural hematoma             -antiplatelet therapy: Not applicable 3. Pain Management:  Hydrocodone as needed 4. Mood:  Zoloft 100 mg daily, Restoril 15 mg daily at bedtime             -antipsychotic agents: not applicable  -team to provide egosupport as possible  5. Neuropsych: This patient is not capable of making decisions on his own behalf. 6. Skin/Wound Care:  Routine skin checks 7. Fluids/Electrolytes/Nutrition:  Routine in and out's  BMP normal on 3/2 8. Seizure disorder. Keppra 1500 mg twice a day, Vimpat 200 mg twice a day, valproic acid 500 mg every 8 hours.   -Plan to continue this regimen to follow up outpatient with neurology services. 9. New-onset atrial fibrillation with RVR. Cardizem initiated 60 mg every 6 hours 03/16/2018. Cardiac rate control. No anticoagulation due to subdural hematoma. Follow-up cardiology services Dr. Garnette Scheuermann  -HR controlled at present 10. Dysphagia. Dysphagia #3 thin liquids.   Follow-up modified barium swallow in the future  Advance diet per SLP,  11. Constipation: sorbitol 3/3 with results  -changed regimen to senkot-s 2 tabs at hs 13.  Hypoalbuminemia  Supplement initiated on 2/25 14.  Anemia with macrocytosis  Hemoglobin 11.8 on 03/24/2018  Vit B12 WNL  Folate normal on 03/24/2018  LOS: 10 days A FACE TO FACE EVALUATION WAS PERFORMED  Ranelle Oyster 03/30/2018, 8:42 AM

## 2018-03-30 NOTE — Progress Notes (Signed)
Speech Language Pathology Daily Session Note  Patient Details  Name: Alvin Hudson MRN: [id number] Date of Birth: 09/03/1935  Today's Date: 03/30/2018 SLP Individual Time: 1200-1230 SLP Individual Time Calculation (min): 30 min  Short Term Goals: Week 2: SLP Short Term Goal 1 (Week 2): Patient will consume trials of Dys. 3 textures with efficient mastication and complete oral clearance without overt s/s of aspiration with Min A verbal cues over 2 sessions prior to upgrade.  SLP Short Term Goal 2 (Week 2): Patient will consume current diet with minimal overt s/s of aspiration with Min A verbal cues for use of swallowing strategies.  SLP Short Term Goal 3 (Week 2): Patient will demonstrate selective attention to functional tasks in a minimally distracting enviornment for ~30 minutes with Min A verbal cues for redirection.  SLP Short Term Goal 4 (Week 2): Patient will demonstrate functional problem solving for basic and familiar tasks with Min A verbal cues.  SLP Short Term Goal 5 (Week 2): Patient will self-monitor and correct errors at the phrase and sentence level with Min A verbal cues.  SLP Short Term Goal 6 (Week 2): Patient will utulize word-finding strategies at the phrase level with Min A mulitmodal cues.   Skilled Therapeutic Interventions:  Skilled treatment session focused on dysphagia goals and education with pt's wife. SLP facilitated session by providing supervision of wife supervising pt consuming dysphagia 3 snack. Wife is appropriate to sign off for supervision. SLP further facilitated session by providing Mod A cues for impulsivity as pt immediately wanted to get out of bed to have bowel movement. Pt able to articulate "you get the walker." SLP provided assistance coordinating use of walker and handed pt off to nursing to finished pericare.      Pain Pain Assessment Pain Scale: 0-10 Pain Score: 0-No pain  Therapy/Group: Individual Therapy  [name] 03/30/2018, 3:13  PM

## 2018-03-31 ENCOUNTER — Inpatient Hospital Stay (HOSPITAL_COMMUNITY): Payer: Medicare Other | Admitting: Physical Therapy

## 2018-03-31 ENCOUNTER — Inpatient Hospital Stay (HOSPITAL_COMMUNITY): Payer: Medicare Other | Admitting: Occupational Therapy

## 2018-03-31 ENCOUNTER — Inpatient Hospital Stay (HOSPITAL_COMMUNITY): Payer: Medicare Other | Admitting: Speech Pathology

## 2018-03-31 NOTE — Progress Notes (Signed)
Physical Therapy Session Note  Patient Details  Name: Alvin Hudson MRN: [id number] Date of Birth: 21-Jul-1935  Today's Date: 03/31/2018 PT Individual Time: 1500-1530 PT Individual Time Calculation (min): 30 min   Short Term Goals: Week 2:  PT Short Term Goal 1 (Week 2): pt will perform functional transfers with min A PT Short Term Goal 2 (Week 2): pt will perform gait in controlled environment x 100' with min A  Skilled Therapeutic Interventions/Progress Updates:   Pt in supine and agreeable to therapy, no c/o pain. Supervision bed mobility and CGA transfer to w/c. Total assist w/c transport to/from therapy gym for time management. Worked on gait training and standing balance this session. Ambulated w/o AD to work on balance strategies, min assist, 100'. Worked on postural control while performing UE clothespin task on basketball net. No UE support while reaching halfway to ground, min assist for balance w/ min manual facilitation of weight shifting at hips. Pt reporting he needed to urgently void. Returned to room total assist in w/c and performed toilet transfer w/ min assist. Pt w/ incontinent void. Stood at sink to wash hands w/ min assist. When asking pt to return to w/c to return to gym, pt refused. Pt stated he wanted to spend time w/ his daughter and he was tired. Unable to convince otherwise, even w/ wife's encouragement. Ended session in supine, all needs in reach. Missed 30 min of skilled PT 2/2 refusal/fatigue.   Therapy Documentation Precautions:  Precautions Precautions: Fall Restrictions Weight Bearing Restrictions: No Vital Signs: Therapy Vitals Temp: 97.7 F (36.5 C) Temp Source: Oral Pulse Rate: 77 Resp: 18 BP: 121/78 Oxygen Therapy SpO2: 100 %  Therapy/Group: Individual Therapy  Tifany Hirsch Melton Krebs 03/31/2018, 4:22 PM

## 2018-03-31 NOTE — Progress Notes (Signed)
Occupational Therapy Session Note  Patient Details  Name: Alvin Hudson MRN: [id number] Date of Birth: 02-14-1935  Today's Date: 03/31/2018 OT Individual Time: 1886-7737 OT Individual Time Calculation (min): 60 min   Short Term Goals: Week 2:  OT Short Term Goal 1 (Week 2): Pt will complete toilet transfers with min A OT Short Term Goal 2 (Week 2): Pt will stand for 1 grooming task with min A for balance OT Short Term Goal 3 (Week 2): Pt will complete 1/3 toileting steps  Skilled Therapeutic Interventions/Progress Updates:     Pt greeted semi-reclined in bed with spouse present and agreeable to OT treatment session. Pt also requests to shave today. Pt came to sitting EOB w/ supervision. Verbal cues for hand placement for sit<>stand w/ RW. Pt ambulated into bathroom w/ RW and CGA. Pt sat on commode and voided bladder, then ambulated into shower with CGA and verbal cues for tehcnique w/ RW. Bathing completed with overall supervision w/ CGA for balance when standing to wash bottom. Dressing completed wc at the sink with set-up A and increased time. Pt stood at the sink to place shaving cream with CGA for balance 2/2 occasion posterior bias. Pt fatigued after ~5 mins standing, so shaving task completed in sitting. Pt needed assist from OT to orient razor correctly when using R hand, but demonstrated improved overall control of R UE. Pt needed OT assistance for thoroughness. OT educated pt's spouse on safe bed><chair transfers and she demonstrated transfer to get pt back to bed. Pt left semi-reclined with bed alarm on and spouse present.   Therapy Documentation Precautions:  Precautions Precautions: Fall Restrictions Weight Bearing Restrictions: No Pain: Pain Assessment Pain Scale: 0-10 Pain Score: 0-No pain   Therapy/Group: Individual Therapy  Mal Amabile 03/31/2018, 12:23 PM

## 2018-03-31 NOTE — Progress Notes (Signed)
Speech Language Pathology Daily Session Note  Patient Details  Name: Alvin Hudson MRN: [id number] Date of Birth: [birth year]  Today's Date: 03/31/2018 SLP Individual Time: 1330-1400 SLP Individual Time Calculation (min): 30 min  Short Term Goals: Week 2: SLP Short Term Goal 1 (Week 2): Patient will consume trials of Dys. 3 textures with efficient mastication and complete oral clearance without overt s/s of aspiration with Min A verbal cues over 2 sessions prior to upgrade.  SLP Short Term Goal 2 (Week 2): Patient will consume current diet with minimal overt s/s of aspiration with Min A verbal cues for use of swallowing strategies.  SLP Short Term Goal 3 (Week 2): Patient will demonstrate selective attention to functional tasks in a minimally distracting enviornment for ~30 minutes with Min A verbal cues for redirection.  SLP Short Term Goal 4 (Week 2): Patient will demonstrate functional problem solving for basic and familiar tasks with Min A verbal cues.  SLP Short Term Goal 5 (Week 2): Patient will self-monitor and correct errors at the phrase and sentence level with Min A verbal cues.  SLP Short Term Goal 6 (Week 2): Patient will utulize word-finding strategies at the phrase level with Min A mulitmodal cues.   Skilled Therapeutic Interventions:  Skilled treatment session focused on communication goals. SLP provided Min A semantic cues to aid in pt's word finding when describing attempt to shave during OT session. Pt also able to describe and self-correct phonemic errors when describing activities from yesterday's session with ST. Pt able to express name of game and rules. Pt with improved expressive abilities with this write during this session. Pt handed off to PT.      Pain    Therapy/Group: Individual Therapy  Helyne Genther 03/31/2018, 3:10 PM

## 2018-03-31 NOTE — Progress Notes (Signed)
[facility] PHYSICAL MEDICINE & REHABILITATION PROGRESS NOTE  Subjective/Complaints: Feeling much better yesterday. Had a good night sleep.   ROS: Limited due to language    Objective: Vital Signs: Blood pressure 135/87, pulse 88, temperature 97.7 F (36.5 C), temperature source Oral, resp. rate 18, height 5\' 11"  (1.803 m), weight 65 kg, SpO2 100 %. No results found. No results for input(s): WBC, HGB, HCT, PLT in the last 72 hours. No results for input(s): NA, K, CL, CO2, GLUCOSE, BUN, CREATININE, CALCIUM in the last 72 hours.  Physical Exam: BP 135/87 (BP Location: Left Arm)   Pulse 88   Temp 97.7 F (36.5 C) (Oral)   Resp 18   Ht 5\' 11"  (1.803 m)   Wt 65 kg   SpO2 100%   BMI 19.99 kg/m  Constitutional: No distress . Vital signs reviewed. HEENT: EOMI, oral membranes moist Neck: supple Cardiovascular: RRR without murmur. No JVD    Respiratory: CTA Bilaterally without wheezes or rales. Normal effort    GI: BS +, non-tender, non-distended   Musculoskeletal: No edema or tenderness in extremities  Neurological: alert, more spontaneous language.  Motor: Grossly 4+/5 throughout, apraxic with RUE Skin: See above  Psychiatric:just waking up, pleasant  Assessment/Plan: 1. Functional deficits secondary to left SDH which require 3+ hours per day of interdisciplinary therapy in a comprehensive inpatient rehab setting.  Physiatrist is providing close team supervision and 24 hour management of active medical problems listed below.  Physiatrist and rehab team continue to assess barriers to discharge/monitor patient progress toward functional and medical goals  Care Tool:  Bathing  Bathing activity did not occur: (did not occur.) Body parts bathed by patient: Right arm, Chest, Abdomen, Front perineal area, Buttocks, Right upper leg, Left upper leg, Face, Left arm, Right lower leg   Body parts bathed by helper: Left lower leg     Bathing assist Assist Level: Minimal Assistance -  Patient > 75%     Upper Body Dressing/Undressing Upper body dressing Upper body dressing/undressing activity did not occur (including orthotics): (did not occur; pt was already in bed.) What is the patient wearing?: Pull over shirt    Upper body assist Assist Level: Supervision/Verbal cueing    Lower Body Dressing/Undressing Lower body dressing    Lower body dressing activity did not occur: (did not occur.) What is the patient wearing?: Incontinence brief, Pants     Lower body assist Assist for lower body dressing: Minimal Assistance - Patient > 75%     Toileting Toileting    Toileting assist Assist for toileting: Minimal Assistance - Patient > 75%     Transfers Chair/bed transfer  Transfers assist  Chair/bed transfer activity did not occur: (did not occur.)  Chair/bed transfer assist level: Minimal Assistance - Patient > 75%     Locomotion Ambulation   Ambulation assist      Assist level: Minimal Assistance - Patient > 75% Assistive device: Walker-rolling Max distance: 10   Walk 10 feet activity   Assist     Assist level: Minimal Assistance - Patient > 75% Assistive device: Walker-rolling   Walk 50 feet activity   Assist Walk 50 feet with 2 turns activity did not occur: Safety/medical concerns  Assist level: Minimal Assistance - Patient > 75% Assistive device: Walker-rolling    Walk 150 feet activity   Assist Walk 150 feet activity did not occur: Safety/medical concerns  Assist level: Moderate Assistance - Patient - 50 - 74% Assistive device: Walker-rolling    Walk  10 feet on uneven surface  activity   Assist Walk 10 feet on uneven surfaces activity did not occur: Safety/medical concerns         Wheelchair     Assist   Type of Wheelchair: Manual    Wheelchair assist level: Dependent - Patient 0% Max wheelchair distance: 25    Wheelchair 50 feet with 2 turns activity    Assist    Wheelchair 50 feet with 2 turns  activity did not occur: Safety/medical concerns       Wheelchair 150 feet activity     Assist Wheelchair 150 feet activity did not occur: Safety/medical concerns          Medical Problem List and Plan: 1.  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 Status post left frontotemporal parietal craniotomy evacuation of subdural hematoma 03/10/2018  Continue CIR, PT OT speech  -pt/wife aware of importance of normal sleep patterns 2.  Antithrombotics: -DVT/anticoagulation:  SCDs. No anticoagulation due to subdural hematoma             -antiplatelet therapy: Not applicable 3. Pain Management:  Hydrocodone as needed 4. Mood:  Zoloft 100 mg daily, Restoril 15 mg daily at bedtime             -antipsychotic agents: not applicable  -team to provide egosupport as possible  5. Neuropsych: This patient is not capable of making decisions on his own behalf. 6. Skin/Wound Care:  Routine skin checks 7. Fluids/Electrolytes/Nutrition:  better po intake  BMP normal on 3/2--recheck monday 8. Seizure disorder. Keppra 1500 mg twice a day, Vimpat 200 mg twice a day, valproic acid 500 mg every 8 hours.   -check vpa level and lft's on monday  -Plan to continue this regimen to follow up outpatient with neurology services. 9. New-onset atrial fibrillation with RVR. Cardizem initiated 60 mg every 6 hours 03/16/2018. Cardiac rate control. No anticoagulation due to subdural hematoma. Follow-up cardiology services Dr. Garnette Scheuermann  -HR controlled at present 10. Dysphagia. Dysphagia #3 thin liquids.   Follow-up modified barium swallow in the future  Advance diet per SLP, intake improving 11. Constipation: sorbitol 3/3 with results  -changed regimen to senkot-s 2 tabs at hs 13.  Hypoalbuminemia  Supplement initiated on 2/25 14.  Anemia with macrocytosis  Hemoglobin 11.8 on 03/24/2018--recheck monday  Vit B12 WNL  Folate normal on 03/24/2018  LOS: 11 days A FACE TO FACE  EVALUATION WAS PERFORMED  Ranelle Oyster 03/31/2018, 9:25 AM

## 2018-03-31 NOTE — Progress Notes (Signed)
Pt slept well throughout the night. Denies any pain this morning. Pt was continent and incontinent last night. Wife remains with patient. Pt did not attempt to get OOB throughout the night. Wife remains with pt.

## 2018-03-31 NOTE — Progress Notes (Signed)
Physical Therapy Session Note  Patient Details  Name: Alvin Hudson MRN: [id number] Date of Birth: [birth year]  Today's Date: 03/31/2018 PT Individual Time: 0830-0925 PT Individual Time Calculation (min): 55 min   Short Term Goals: Week 2:  PT Short Term Goal 1 (Week 2): pt will perform functional transfers with min A PT Short Term Goal 2 (Week 2): pt will perform gait in controlled environment x 100' with min A  Skilled Therapeutic Interventions/Progress Updates:    pt in bed, agreeable to treatment. Pt able to Alvin Hudson/doff shirt and shoes/socks with set up assist seated edge of bed. Pt performs gait to toilet with min A with RW.  Continent of bladder.  Standing balance without UE support for handwashing and brushing teeth with CGA.  Gait 150' x 2 with RW and CGA, cues for increasing stride length. Pt still requiring frequent standing rest breaks due to impaired attention.  Standing squat and reach task with picking up horseshoes with Rt UE. Pt requires min A, increased time for Rt hand control.  Pt then states his head feels "not right".  No c/o pain or dizziness. Pt returned to room and RN made aware.   Therapy Documentation Precautions:  Precautions Precautions: Fall Restrictions Weight Bearing Restrictions: No Pain:  no c/o pain   Therapy/Group: Individual Therapy  Demaris Leavell 03/31/2018, 9:28 AM

## 2018-04-01 ENCOUNTER — Inpatient Hospital Stay (HOSPITAL_COMMUNITY): Payer: Medicare Other | Admitting: Occupational Therapy

## 2018-04-01 ENCOUNTER — Inpatient Hospital Stay (HOSPITAL_COMMUNITY): Payer: Medicare Other

## 2018-04-01 NOTE — Progress Notes (Signed)
Occupational Therapy Session Note  Patient Details  Name: Alvin Hudson MRN: [id number] Date of Birth: 06/14/1935  Today's Date: 04/01/2018 OT Individual Time: 1115-1225 OT Individual Time Calculation (min): 70 min    Short Term Goals: Week 2:  OT Short Term Goal 1 (Week 2): Pt will complete toilet transfers with min A OT Short Term Goal 2 (Week 2): Pt will stand for 1 grooming task with min A for balance OT Short Term Goal 3 (Week 2): Pt will complete 1/3 toileting steps  Skilled Therapeutic Interventions/Progress Updates:    Pt greeted semi-reclined in bed and agreeable to OT treatment session. Pt states " I need to pee first." Pt needed verbal cues not to pull up on RW with sit<>stand w/ CGA. Pt ambulated into bathroom w/ RW and CGA. Pt voided bladder in standing w/ CGA for balance 2/2 posterior bias. Pt then requests to shave. Worked on shaving task seated in wc at the sink with OT assist to maintain razor orientation. Dressing completed sit<>stand from wc with CGA for balance and increased time to orient clothing. Pt ambulated 75 ft x 3 with seated rest breaks in between w/ RW and CGA.  Worked on R hand fine motor control focus on finger rotation using graded nuts and bolts activity. Pt returned to room at end of session and completed stand pivot back to bed with CGA and verbal cues for hand placement. Pt left semi-reclined in bed with bed alarm on and needs met.   Therapy Documentation Precautions:  Precautions Precautions: Fall Restrictions Weight Bearing Restrictions: No Pain:  none/denies pain     Therapy/Group: Individual Therapy   S  04/01/2018, 11:46 AM 

## 2018-04-01 NOTE — Plan of Care (Signed)
  Problem: RH BOWEL ELIMINATION Goal: RH STG MANAGE BOWEL WITH ASSISTANCE Description STG Manage Bowel with Assistance. Mod  Outcome: Progressing   Problem: RH BLADDER ELIMINATION Goal: RH STG MANAGE BLADDER WITH ASSISTANCE Description STG Manage Bladder With Assistance. Mod  Outcome: Progressing   Problem: RH SKIN INTEGRITY Goal: RH STG SKIN FREE OF INFECTION/BREAKDOWN Description Free of breakdown, infection while on rehab mod assist.   Outcome: Progressing   Problem: RH SAFETY Goal: RH STG ADHERE TO SAFETY PRECAUTIONS W/ASSISTANCE/DEVICE Description STG Adhere to Safety Precautions With Assistance/Device.Mod  Outcome: Progressing   Problem: RH COGNITION-NURSING Goal: RH STG USES MEMORY AIDS/STRATEGIES W/ASSIST TO PROBLEM SOLVE Description STG Uses Memory Aids/Strategies With Assistance to Problem Solve. Mod  Outcome: Progressing   Problem: RH KNOWLEDGE DEFICIT BRAIN INJURY Goal: RH STG INCREASE KNOWLEDGE OF SELF CARE AFTER BRAIN INJURY Description Patient and family will be able to describe care of patient following BI with cues, handouts  Outcome: Progressing   Problem: Consults Goal: RH BRAIN INJURY PATIENT EDUCATION Description Description: See Patient Education module for eduction specifics Outcome: Progressing   

## 2018-04-01 NOTE — Progress Notes (Signed)
[place] PHYSICAL MEDICINE & REHABILITATION PROGRESS NOTE  Subjective/Complaints: Patient seen lying in bed this morning.  He appears to indicate that he slept well overnight.  No reported issues.  ROS: Limited due to cognition   Objective: Vital Signs: Blood pressure 130/76, pulse (!) 58, temperature 97.8 F (36.6 C), temperature source Oral, resp. rate 16, height 5\' 11"  (1.803 m), weight 69.6 kg, SpO2 99 %. No results found. No results for input(s): WBC, HGB, HCT, PLT in the last 72 hours. No results for input(s): NA, K, CL, CO2, GLUCOSE, BUN, CREATININE, CALCIUM in the last 72 hours.  Physical Exam: BP 130/76 (BP Location: Left Arm)   Pulse (!) 58   Temp 97.8 F (36.6 C) (Oral)   Resp 16   Ht 5\' 11"  (1.803 m)   Wt 69.6 kg   SpO2 99%   BMI 21.40 kg/m  Constitutional: No distress . Vital signs reviewed. HENT: Incision C/D/. Eyes: EOMI. No discharge. Cardiovascular: Irregularly irregular. No JVD. Respiratory: CTA Bilaterally. Normal effort. GI: BS +. Non-distended. Musc: No edema or tenderness in extremities. Neurological: alert Aphasia Motor: Grossly 4+/5 throughout, apraxic with RUE, unchanged Skin: See above  Psychiatric: Appears to have normal mood and affect.  Assessment/Plan: 1. Functional deficits secondary to left SDH which require 3+ hours per day of interdisciplinary therapy in a comprehensive inpatient rehab setting.  Physiatrist is providing close team supervision and 24 hour management of active medical problems listed below.  Physiatrist and rehab team continue to assess barriers to discharge/monitor patient progress toward functional and medical goals  Care Tool:  Bathing  Bathing activity did not occur: (did not occur.) Body parts bathed by patient: Right arm, Chest, Abdomen, Front perineal area, Buttocks, Right upper leg, Left upper leg, Face, Left arm, Right lower leg   Body parts bathed by helper: Left lower leg     Bathing assist Assist  Level: Minimal Assistance - Patient > 75%     Upper Body Dressing/Undressing Upper body dressing Upper body dressing/undressing activity did not occur (including orthotics): (did not occur; pt was already in bed.) What is the patient wearing?: Pull over shirt    Upper body assist Assist Level: Supervision/Verbal cueing    Lower Body Dressing/Undressing Lower body dressing    Lower body dressing activity did not occur: (did not occur.) What is the patient wearing?: Incontinence brief, Pants     Lower body assist Assist for lower body dressing: Minimal Assistance - Patient > 75%     Toileting Toileting    Toileting assist Assist for toileting: Minimal Assistance - Patient > 75%     Transfers Chair/bed transfer  Transfers assist  Chair/bed transfer activity did not occur: (did not occur.)  Chair/bed transfer assist level: Contact Guard/Touching assist     Locomotion Ambulation   Ambulation assist      Assist level: Minimal Assistance - Patient > 75% Assistive device: Walker-rolling Max distance: 100'   Walk 10 feet activity   Assist     Assist level: Minimal Assistance - Patient > 75% Assistive device: Walker-rolling   Walk 50 feet activity   Assist Walk 50 feet with 2 turns activity did not occur: Safety/medical concerns  Assist level: Minimal Assistance - Patient > 75% Assistive device: Walker-rolling    Walk 150 feet activity   Assist Walk 150 feet activity did not occur: Safety/medical concerns  Assist level: Moderate Assistance - Patient - 50 - 74% Assistive device: Walker-rolling    Walk 10 feet on uneven  surface  activity   Assist Walk 10 feet on uneven surfaces activity did not occur: Safety/medical concerns         Wheelchair     Assist   Type of Wheelchair: Manual    Wheelchair assist level: Dependent - Patient 0% Max wheelchair distance: 25    Wheelchair 50 feet with 2 turns activity    Assist     Wheelchair 50 feet with 2 turns activity did not occur: Safety/medical concerns       Wheelchair 150 feet activity     Assist Wheelchair 150 feet activity did not occur: Safety/medical concerns          Medical Problem List and Plan: 1.  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 Status post left frontotemporal parietal craniotomy evacuation of subdural hematoma 03/10/2018  Continue CIR  Notes reviewed-progressing with therapies, labs reviewed 2.  Antithrombotics: -DVT/anticoagulation:  SCDs. No anticoagulation due to subdural hematoma             -antiplatelet therapy: Not applicable 3. Pain Management:  Hydrocodone as needed 4. Mood:  Zoloft 100 mg daily, Restoril 15 mg daily at bedtime             -antipsychotic agents: not applicable  -team to provide egosupport as possible  5. Neuropsych: This patient is not capable of making decisions on his own behalf. 6. Skin/Wound Care:  Routine skin checks 7. Fluids/Electrolytes/Nutrition:  BMP within acceptable range on 3/2, labs ordered for Monday  8. Seizure disorder. Keppra 1500 mg twice a day, Vimpat 200 mg twice a day, valproic acid 500 mg every 8 hours.   -check vpa level and lft's on Monday, within range on 2/23  -Plan to continue this regimen to follow up outpatient with neurology services. 9. New-onset atrial fibrillation with RVR. Cardizem initiated 60 mg every 6 hours 03/16/2018. Cardiac rate control. No anticoagulation due to subdural hematoma. Follow-up cardiology services Dr. Garnette Scheuermann  -HR controlled at present 10. Dysphagia. Dysphagia #3 thin liquids.   Follow-up modified barium swallow in the future  Advance diet per SLP, intake improving 11. Constipation: sorbitol 3/3 with results  -changed regimen to senkot-s 2 tabs at hs 13.  Hypoalbuminemia  Supplement initiated on 2/25 14.  Anemia with macrocytosis  Hemoglobin 11.8 on 03/24/2018, labs ordered for Monday  Vit B12  WNL  Folate normal on 03/24/2018  LOS: 12 days A FACE TO FACE EVALUATION WAS PERFORMED  Elvira Langston Karis Juba 04/01/2018, 7:42 AM

## 2018-04-01 NOTE — Progress Notes (Signed)
Speech Language Pathology Daily Session Note  Patient Details  Name: Alvin Hudson MRN: [id number] Date of Birth: [birth year]  Today's Date: 04/01/2018 SLP Individual Time: 1400-1445 SLP Individual Time Calculation (min): 45 min  Short Term Goals: Week 2: SLP Short Term Goal 1 (Week 2): Patient will consume trials of Dys. 3 textures with efficient mastication and complete oral clearance without overt s/s of aspiration with Min A verbal cues over 2 sessions prior to upgrade.  SLP Short Term Goal 2 (Week 2): Patient will consume current diet with minimal overt s/s of aspiration with Min A verbal cues for use of swallowing strategies.  SLP Short Term Goal 3 (Week 2): Patient will demonstrate selective attention to functional tasks in a minimally distracting enviornment for ~30 minutes with Min A verbal cues for redirection.  SLP Short Term Goal 4 (Week 2): Patient will demonstrate functional problem solving for basic and familiar tasks with Min A verbal cues.  SLP Short Term Goal 5 (Week 2): Patient will self-monitor and correct errors at the phrase and sentence level with Min A verbal cues.  SLP Short Term Goal 6 (Week 2): Patient will utulize word-finding strategies at the phrase level with Min A mulitmodal cues.   Skilled Therapeutic Interventions:Skilled ST services focused on speech skills. SLP facilitated basic problem solving skills utilizing picture card to identify three differences among two cards, pt demonstrated ability to find 2 out 3 differences and required verbal cuing to locate 3rd difference in 5 opportunities. SLP facilitated word finding skills in structed picture description task, pt required min A verbal cues at sentence level. Pt required mod A verbal cues for word finding in unstructured conversation and demonstrated increase awareness/correction of errors given min A verbal cues. Pt requested to use the bathroom, SLP assisted, as well as changing into a new brief, pt required min   physical assistance. Pt was left in room with call bell within reach and bed alarm set. SLP reccomends to continue skilled services.       Pain Pain Assessment Pain Scale: 0-10 Pain Score: 0-No pain Faces Pain Scale: No hurt  Therapy/Group: Individual Therapy  Alvin Hudson  [facility] 04/01/2018, 12:55 PM

## 2018-04-01 NOTE — Progress Notes (Signed)
Pt is Alert and oriented times four. Speech is clear with some mild expressive aphasia. Pt given tylenol for general mild aches with relief. Pt has been continent of urine. Ambulated to BR with RW 1 assist. Resting in bed wife remains with pt.

## 2018-04-02 NOTE — Plan of Care (Signed)
  Problem: RH BOWEL ELIMINATION Goal: RH STG MANAGE BOWEL WITH ASSISTANCE Description STG Manage Bowel with Assistance. Mod  Outcome: Progressing   Problem: RH BLADDER ELIMINATION Goal: RH STG MANAGE BLADDER WITH ASSISTANCE Description STG Manage Bladder With Assistance. Mod  Outcome: Progressing   Problem: RH SKIN INTEGRITY Goal: RH STG SKIN FREE OF INFECTION/BREAKDOWN Description Free of breakdown, infection while on rehab mod assist.   Outcome: Progressing   Problem: RH SAFETY Goal: RH STG ADHERE TO SAFETY PRECAUTIONS W/ASSISTANCE/DEVICE Description STG Adhere to Safety Precautions With Assistance/Device.Mod  Outcome: Progressing   Problem: RH COGNITION-NURSING Goal: RH STG USES MEMORY AIDS/STRATEGIES W/ASSIST TO PROBLEM SOLVE Description STG Uses Memory Aids/Strategies With Assistance to Problem Solve. Mod  Outcome: Progressing   Problem: RH KNOWLEDGE DEFICIT BRAIN INJURY Goal: RH STG INCREASE KNOWLEDGE OF SELF CARE AFTER BRAIN INJURY Description Patient and family will be able to describe care of patient following BI with cues, handouts  Outcome: Progressing   Problem: Consults Goal: RH BRAIN INJURY PATIENT EDUCATION Description Description: See Patient Education module for eduction specifics Outcome: Progressing

## 2018-04-02 NOTE — Progress Notes (Signed)
[place] PHYSICAL MEDICINE & REHABILITATION PROGRESS NOTE  Subjective/Complaints: Patient seen laying in bed this morning.  He states he slept well overnight.  No reported issues overnight.  No reported issues per wife.  ROS: Limited due to cognition   Objective: Vital Signs: Blood pressure 111/70, pulse 73, temperature 97.7 F (36.5 C), temperature source Oral, resp. rate 18, height 5\' 11"  (1.803 m), weight 66.8 kg, SpO2 100 %. No results found. No results for input(s): WBC, HGB, HCT, PLT in the last 72 hours. No results for input(s): NA, K, CL, CO2, GLUCOSE, BUN, CREATININE, CALCIUM in the last 72 hours.  Physical Exam: BP 111/70 (BP Location: Left Arm)   Pulse 73   Temp 97.7 F (36.5 C) (Oral)   Resp 18   Ht 5\' 11"  (1.803 m)   Wt 66.8 kg   SpO2 100%   BMI 20.54 kg/m  Constitutional: No distress . Vital signs reviewed. HENT: Incision C/D/. Eyes: EOMI. No discharge. Cardiovascular: Irregularly irregular.  No JVD. Respiratory: CTA bilaterally.  Normal effort. GI: BS +. Non-distended. Musc: No edema or tenderness in extremities. Neurological: alert Aphasia Motor: Grossly 4+/5 throughout, apraxic with RUE, stable Skin: See above  Psychiatric: Appears to have normal mood and affect.  Assessment/Plan: 1. Functional deficits secondary to left SDH which require 3+ hours per day of interdisciplinary therapy in a comprehensive inpatient rehab setting.  Physiatrist is providing close team supervision and 24 hour management of active medical problems listed below.  Physiatrist and rehab team continue to assess barriers to discharge/monitor patient progress toward functional and medical goals  Care Tool:  Bathing  Bathing activity did not occur: (did not occur.) Body parts bathed by patient: Right arm, Chest, Abdomen, Front perineal area, Buttocks, Right upper leg, Left upper leg, Face, Left arm, Right lower leg   Body parts bathed by helper: Left lower leg     Bathing  assist Assist Level: Minimal Assistance - Patient > 75%     Upper Body Dressing/Undressing Upper body dressing Upper body dressing/undressing activity did not occur (including orthotics): (did not occur; pt was already in bed.) What is the patient wearing?: Pull over shirt    Upper body assist Assist Level: Supervision/Verbal cueing    Lower Body Dressing/Undressing Lower body dressing    Lower body dressing activity did not occur: (did not occur.) What is the patient wearing?: Incontinence brief, Pants     Lower body assist Assist for lower body dressing: Minimal Assistance - Patient > 75%     Toileting Toileting    Toileting assist Assist for toileting: Minimal Assistance - Patient > 75%     Transfers Chair/bed transfer  Transfers assist  Chair/bed transfer activity did not occur: (did not occur.)  Chair/bed transfer assist level: Contact Guard/Touching assist     Locomotion Ambulation   Ambulation assist      Assist level: Minimal Assistance - Patient > 75% Assistive device: Walker-rolling Max distance: 100'   Walk 10 feet activity   Assist     Assist level: Minimal Assistance - Patient > 75% Assistive device: Walker-rolling   Walk 50 feet activity   Assist Walk 50 feet with 2 turns activity did not occur: Safety/medical concerns  Assist level: Minimal Assistance - Patient > 75% Assistive device: Walker-rolling    Walk 150 feet activity   Assist Walk 150 feet activity did not occur: Safety/medical concerns  Assist level: Moderate Assistance - Patient - 50 - 74% Assistive device: Walker-rolling    Walk  10 feet on uneven surface  activity   Assist Walk 10 feet on uneven surfaces activity did not occur: Safety/medical concerns         Wheelchair     Assist   Type of Wheelchair: Manual    Wheelchair assist level: Dependent - Patient 0% Max wheelchair distance: 25    Wheelchair 50 feet with 2 turns  activity    Assist    Wheelchair 50 feet with 2 turns activity did not occur: Safety/medical concerns       Wheelchair 150 feet activity     Assist Wheelchair 150 feet activity did not occur: Safety/medical concerns          Medical Problem List and Plan: 1.  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 Status post left frontotemporal parietal craniotomy evacuation of subdural hematoma 03/10/2018  Continue CIR 2.  Antithrombotics: -DVT/anticoagulation:  SCDs. No anticoagulation due to subdural hematoma             -antiplatelet therapy: Not applicable 3. Pain Management:  Hydrocodone as needed 4. Mood:  Zoloft 100 mg daily, Restoril 15 mg daily at bedtime             -antipsychotic agents: not applicable  -team to provide egosupport as possible  5. Neuropsych: This patient is not capable of making decisions on his own behalf. 6. Skin/Wound Care:  Routine skin checks 7. Fluids/Electrolytes/Nutrition:  BMP within acceptable range on 3/2, labs ordered for tomorrow 8. Seizure disorder. Keppra 1500 mg twice a day, Vimpat 200 mg twice a day, valproic acid 500 mg every 8 hours.   -check vpa level and lft's tomorrow, within range on 2/23  -Plan to continue this regimen to follow up outpatient with neurology services. 9. New-onset atrial fibrillation with RVR. Cardizem initiated 60 mg every 6 hours 03/16/2018. Cardiac rate control. No anticoagulation due to subdural hematoma. Follow-up cardiology services Dr. Garnette Scheuermann  -HR controlled on 3/8 10. Dysphagia. Dysphagia #3 thin liquids.   Follow-up modified barium swallow in the future  Advance diet per SLP, intake improving 11. Constipation: sorbitol 3/3 with results  -changed regimen to senkot-s 2 tabs at hs 13.  Hypoalbuminemia  Supplement initiated on 2/25 14.  Anemia with macrocytosis  Hemoglobin 11.8 on 03/24/2018, labs ordered for tomorrow  Vit B12 WNL  Folate normal on 03/24/2018  LOS: 13  days A FACE TO FACE EVALUATION WAS PERFORMED  Nuriya Stuck Karis Juba 04/02/2018, 3:57 PM

## 2018-04-03 ENCOUNTER — Inpatient Hospital Stay (HOSPITAL_COMMUNITY): Payer: Medicare Other | Admitting: Physical Therapy

## 2018-04-03 ENCOUNTER — Inpatient Hospital Stay (HOSPITAL_COMMUNITY): Payer: Medicare Other | Admitting: Occupational Therapy

## 2018-04-03 ENCOUNTER — Inpatient Hospital Stay (HOSPITAL_COMMUNITY): Payer: Medicare Other | Admitting: Speech Pathology

## 2018-04-03 LAB — CBC
HCT: 33.5 % — ABNORMAL LOW (ref 39.0–52.0)
Hemoglobin: 10.8 g/dL — ABNORMAL LOW (ref 13.0–17.0)
MCH: 35.6 pg — ABNORMAL HIGH (ref 26.0–34.0)
MCHC: 32.2 g/dL (ref 30.0–36.0)
MCV: 110.6 fL — ABNORMAL HIGH (ref 80.0–100.0)
Platelets: 215 10*3/uL (ref 150–400)
RBC: 3.03 MIL/uL — ABNORMAL LOW (ref 4.22–5.81)
RDW: 15.6 % — AB (ref 11.5–15.5)
WBC: 6.9 10*3/uL (ref 4.0–10.5)
nRBC: 0 % (ref 0.0–0.2)

## 2018-04-03 LAB — COMPREHENSIVE METABOLIC PANEL
ALT: 21 U/L (ref 0–44)
ANION GAP: 10 (ref 5–15)
AST: 30 U/L (ref 15–41)
Albumin: 3.1 g/dL — ABNORMAL LOW (ref 3.5–5.0)
Alkaline Phosphatase: 48 U/L (ref 38–126)
BUN: 13 mg/dL (ref 8–23)
CO2: 28 mmol/L (ref 22–32)
Calcium: 8.9 mg/dL (ref 8.9–10.3)
Chloride: 101 mmol/L (ref 98–111)
Creatinine, Ser: 0.87 mg/dL (ref 0.61–1.24)
GFR calc Af Amer: >60 mL/min (ref >60)
GFR calc non Af Amer: >60 mL/min (ref >60)
Glucose, Bld: 101 mg/dL — ABNORMAL HIGH (ref 70–99)
POTASSIUM: 4.2 mmol/L (ref 3.5–5.1)
Sodium: 139 mmol/L (ref 135–145)
Total Bilirubin: 0.6 mg/dL (ref 0.3–1.2)
Total Protein: 5.5 g/dL — ABNORMAL LOW (ref 6.5–8.1)

## 2018-04-03 LAB — VALPROIC ACID LEVEL: VALPROIC ACID LVL: 86 ug/mL (ref 50.0–100.0)

## 2018-04-03 NOTE — Progress Notes (Signed)
Occupational Therapy Session Note  Patient Details  Name: Alvin Hudson MRN: [id number] Date of Birth: 1935/08/19  Today's Date: 04/03/2018 OT Individual Time: 1415-1530 OT Individual Time Calculation (min): 75 min    Short Term Goals: Week 2:  OT Short Term Goal 1 (Week 2): Pt will complete toilet transfers with min A OT Short Term Goal 2 (Week 2): Pt will stand for 1 grooming task with min A for balance OT Short Term Goal 3 (Week 2): Pt will complete 1/3 toileting steps  Skilled Therapeutic Interventions/Progress Updates:    Patient in bed and ready for therapy session.  Requested shower to start session today.  Functional mobility with RW in room to obtain clothing min A.  SPT to/from toilet, bed, arm chair, shower seat with back CG/steadying assist.  Completed shower with CS, able to stand with grab bar with CS, cues for safety.  Completed dressing seated in arm chair - CS for OH shirt, min A for pants and pull up.  CS for slip on shoes.  Completed oral care in stance at the sink with CG A. Completed ambulation on unit to/from therapy gym with RW CG A and cues for step length.  Completed hand dexterity activites, hand writing (name, tracing, long strokes, shading) with moderate difficulty.  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  Patient with improved ability to carry on a conversation today and demonstrates appropriate humor.  He returned to bed at close of session with bed alarm set and call bell in reach  Therapy Documentation Precautions:  Precautions Precautions: Fall Restrictions Weight Bearing Restrictions: No General:   Vital Signs: Therapy Vitals Temp: 97.8 F (36.6 C) Temp Source: Oral Pulse Rate: 81 Resp: 18 BP: 123/75 Patient Position (if appropriate): Sitting Oxygen Therapy SpO2: 98 % O2 Device: Room Air Pain: Pain Assessment Pain Scale: 0-10 Pain Score: 0-No pain   Other Treatments:     Therapy/Group:  Individual Therapy  Barrie Lyme 04/03/2018, 3:32 PM

## 2018-04-03 NOTE — Progress Notes (Signed)
Speech Language Pathology Daily Session Note  Patient Details  Name: Alvin Hudson MRN: [id number] Date of Birth: January 05, 1936  Today's Date: 04/03/2018 SLP Individual Time: 0350-0938 SLP Individual Time Calculation (min): 45 min  Short Term Goals: Week 2: SLP Short Term Goal 1 (Week 2): Patient will consume trials of Dys. 3 textures with efficient mastication and complete oral clearance without overt s/s of aspiration with Min A verbal cues over 2 sessions prior to upgrade.  SLP Short Term Goal 2 (Week 2): Patient will consume current diet with minimal overt s/s of aspiration with Min A verbal cues for use of swallowing strategies.  SLP Short Term Goal 3 (Week 2): Patient will demonstrate selective attention to functional tasks in a minimally distracting enviornment for ~30 minutes with Min A verbal cues for redirection.  SLP Short Term Goal 4 (Week 2): Patient will demonstrate functional problem solving for basic and familiar tasks with Min A verbal cues.  SLP Short Term Goal 5 (Week 2): Patient will self-monitor and correct errors at the phrase and sentence level with Min A verbal cues.  SLP Short Term Goal 6 (Week 2): Patient will utulize word-finding strategies at the phrase level with Min A mulitmodal cues.   Skilled Therapeutic Interventions: Skilled treatment session focused on cognitive-linguistic goals. SLP facilitated session by providing Min A verbal cues for patient to self-monitor and correct errors while reading aloud at the paragraph level. Mod A verbal cues were also provided for answering yes/no questions in regards to reading comprehension. Patient requested to use the phone and was able to leave a voice message with extra time. Patient left upright in bed with alarm on and all needs within reach. Continue with current plan of care.      Pain No/Denies Pain   Therapy/Group: Individual Therapy  Kerrigan Gombos 04/03/2018, 3:21 PM

## 2018-04-03 NOTE — Progress Notes (Signed)
Physical Therapy Session Note  Patient Details  Name: Alvin Hudson MRN: [id number] Date of Birth: August 02, 1935  Today's Date: 04/03/2018 PT Individual Time: 0930-1030 PT Individual Time Calculation (min): 60 min   Short Term Goals: Week 2:  PT Short Term Goal 1 (Week 2): pt will perform functional transfers with min A PT Short Term Goal 2 (Week 2): pt will perform gait in controlled environment x 100' with min A  Skilled Therapeutic Interventions/Progress Updates:    pt performs gait throughout unit up to 150' at a time with RW and supervision, cues for posture during gait, continues with frequent standing rest breaks due to fatigue and decreased attention.  Gait with obstacle negotiation stepping around and over obstacles with CGA, cues to avoid obstacles with RW.  Picking up objects from floor with CGA with 1 UE support on RW.  Standing on foam with horseshoe reach and toss with min A, improving balance on foam.  Pt requests to use bathroom x 2 during session. Pt continent of bowel and bladder. Supervision with toilet transfers, min A with clothing management, set up for hygiene. Standing balance without UE support for handwashing with CGA.  Pt left in bed with needs at hand, alarm set.  Therapy Documentation Precautions:  Precautions Precautions: Fall Restrictions Weight Bearing Restrictions: No Pain:  no c/o pain   Therapy/Group: Individual Therapy  Kemaria Dedic 04/03/2018, 10:32 AM

## 2018-04-03 NOTE — Progress Notes (Signed)
[place] PHYSICAL MEDICINE & REHABILITATION PROGRESS NOTE  Subjective/Complaints: Had a good weekend. A little frustrated by lack of progress.   ROS: Patient denies fever, rash, sore throat, blurred vision, nausea, vomiting, diarrhea, cough, shortness of breath or chest pain, joint or back pain, headache, or mood change.   Objective: Vital Signs: Blood pressure (!) 146/69, pulse (!) 59, temperature 97.8 F (36.6 C), temperature source Oral, resp. rate 16, height 5\' 11"  (1.803 m), weight 66.5 kg, SpO2 100 %. No results found. Recent Labs    04/03/18 0559  WBC 6.9  HGB 10.8*  HCT 33.5*  PLT 215   Recent Labs    04/03/18 0559  NA 139  K 4.2  CL 101  CO2 28  GLUCOSE 101*  BUN 13  CREATININE 0.87  CALCIUM 8.9    Physical Exam: BP (!) 146/69 (BP Location: Left Arm)   Pulse (!) 59   Temp 97.8 F (36.6 C) (Oral)   Resp 16   Ht 5\' 11"  (1.803 m)   Wt 66.5 kg   SpO2 100%   BMI 20.45 kg/m  Constitutional: No distress . Vital signs reviewed. HEENT: EOMI, oral membranes moist Neck: supple Cardiovascular: RRR without murmur. No JVD    Respiratory: CTA Bilaterally without wheezes or rales. Normal effort    GI: BS +, non-tender, non-distended  Musc: No edema or tenderness in extremities. Neurological: alert Aphasia byt finds words and phrases with extra time Motor: Grossly 4+/5 throughout, apraxic with RUE, stable Skin: See above  Psychiatric: Appears to have normal mood and affect.  Assessment/Plan: 1. Functional deficits secondary to left SDH which require 3+ hours per day of interdisciplinary therapy in a comprehensive inpatient rehab setting.  Physiatrist is providing close team supervision and 24 hour management of active medical problems listed below.  Physiatrist and rehab team continue to assess barriers to discharge/monitor patient progress toward functional and medical goals  Care Tool:  Bathing  Bathing activity did not occur: (did not occur.) Body  parts bathed by patient: Right arm, Chest, Abdomen, Front perineal area, Buttocks, Right upper leg, Left upper leg, Face, Left arm, Right lower leg   Body parts bathed by helper: Left lower leg     Bathing assist Assist Level: Minimal Assistance - Patient > 75%     Upper Body Dressing/Undressing Upper body dressing Upper body dressing/undressing activity did not occur (including orthotics): (did not occur; pt was already in bed.) What is the patient wearing?: Pull over shirt    Upper body assist Assist Level: Supervision/Verbal cueing    Lower Body Dressing/Undressing Lower body dressing    Lower body dressing activity did not occur: (did not occur.) What is the patient wearing?: Incontinence brief, Pants     Lower body assist Assist for lower body dressing: Minimal Assistance - Patient > 75%     Toileting Toileting    Toileting assist Assist for toileting: Minimal Assistance - Patient > 75%     Transfers Chair/bed transfer  Transfers assist  Chair/bed transfer activity did not occur: (did not occur.)  Chair/bed transfer assist level: Contact Guard/Touching assist     Locomotion Ambulation   Ambulation assist      Assist level: Minimal Assistance - Patient > 75% Assistive device: Walker-rolling Max distance: 100'   Walk 10 feet activity   Assist     Assist level: Minimal Assistance - Patient > 75% Assistive device: Walker-rolling   Walk 50 feet activity   Assist Walk 50 feet with 2  turns activity did not occur: Safety/medical concerns  Assist level: Minimal Assistance - Patient > 75% Assistive device: Walker-rolling    Walk 150 feet activity   Assist Walk 150 feet activity did not occur: Safety/medical concerns  Assist level: Moderate Assistance - Patient - 50 - 74% Assistive device: Walker-rolling    Walk 10 feet on uneven surface  activity   Assist Walk 10 feet on uneven surfaces activity did not occur: Safety/medical concerns          Wheelchair     Assist   Type of Wheelchair: Manual    Wheelchair assist level: Dependent - Patient 0% Max wheelchair distance: 25    Wheelchair 50 feet with 2 turns activity    Assist    Wheelchair 50 feet with 2 turns activity did not occur: Safety/medical concerns       Wheelchair 150 feet activity     Assist Wheelchair 150 feet activity did not occur: Safety/medical concerns          Medical Problem List and Plan: 1.  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 Status post left frontotemporal parietal craniotomy evacuation of subdural hematoma 03/10/2018  Continue CIR. Encourage pt that he's making process. 2.  Antithrombotics: -DVT/anticoagulation:  SCDs. No anticoagulation due to subdural hematoma             -antiplatelet therapy: Not applicable 3. Pain Management:  Hydrocodone as needed 4. Mood:  Zoloft 100 mg daily, Restoril 15 mg daily at bedtime             -antipsychotic agents: not applicable  -team to provide egosupport as possible  5. Neuropsych: This patient is not capable of making decisions on his own behalf. 6. Skin/Wound Care:  Routine skin checks 7. Fluids/Electrolytes/Nutrition:  BMP within acceptable range on 3/9. I personally reviewed the patient's labs today.   8. Seizure disorder. Keppra 1500 mg twice a day, Vimpat 200 mg twice a day, valproic acid 500 mg every 8 hours.   -depakote level therapeutic  -Plan to continue this regimen to follow up outpatient with neurology services. 9. New-onset atrial fibrillation with RVR. Cardizem initiated 60 mg every 6 hours 03/16/2018. Cardiac rate control. No anticoagulation due to subdural hematoma. Follow-up cardiology services Dr. Garnette Scheuermann  -HR controlled on 3/9 10. Dysphagia. Dysphagia #3 thin liquids.   Advance diet per SLP, intake improving 11. Constipation: sorbitol 3/3 with results  -changed regimen to senkot-s 2 tabs at hs 13.   Hypoalbuminemia  Supplement initiated on 2/25 14.  Anemia with macrocytosis  Hemoglobin 11.8 on 03/24/2018, 10.8 today 3/9  Vit B12 WNL  Folate normal on 03/24/2018  LOS: 14 days A FACE TO FACE EVALUATION WAS PERFORMED  Ranelle Oyster 04/03/2018, 9:53 AM

## 2018-04-04 ENCOUNTER — Inpatient Hospital Stay (HOSPITAL_COMMUNITY): Payer: Medicare Other

## 2018-04-04 ENCOUNTER — Inpatient Hospital Stay (HOSPITAL_COMMUNITY): Payer: Medicare Other | Admitting: Speech Pathology

## 2018-04-04 ENCOUNTER — Inpatient Hospital Stay (HOSPITAL_COMMUNITY): Payer: Medicare Other | Admitting: Occupational Therapy

## 2018-04-04 MED ORDER — ENSURE ENLIVE PO LIQD
237.0000 mL | ORAL | Status: DC
  Administered 2018-04-04 – 2018-04-09 (×4): 237 mL via ORAL

## 2018-04-04 NOTE — Progress Notes (Signed)
Pt continent of B/B. Tylenol at bedtime for generalized aching with relief. Wife remains with pt.

## 2018-04-04 NOTE — Progress Notes (Signed)
Speech Language Pathology Weekly Progress and Session Note  Patient Details  Name: Alvin Hudson MRN: [id number] Date of Birth: [birth year]  Beginning of progress report period: March 28, 2018 End of progress report period: April 04, 2018  Today's Date: 04/04/2018 SLP Individual Time: 4132-4401 SLP Individual Time Calculation (min): 45 min  Short Term Goals: Week 2: SLP Short Term Goal 1 (Week 2): Patient will consume trials of Dys. 3 textures with efficient mastication and complete oral clearance without overt s/s of aspiration with Min A verbal cues over 2 sessions prior to upgrade.  SLP Short Term Goal 1 - Progress (Week 2): Met SLP Short Term Goal 2 (Week 2): Patient will consume current diet with minimal overt s/s of aspiration with Min A verbal cues for use of swallowing strategies.  SLP Short Term Goal 2 - Progress (Week 2): Met SLP Short Term Goal 3 (Week 2): Patient will demonstrate selective attention to functional tasks in a minimally distracting enviornment for ~30 minutes with Min A verbal cues for redirection.  SLP Short Term Goal 3 - Progress (Week 2): Met SLP Short Term Goal 4 (Week 2): Patient will demonstrate functional problem solving for basic and familiar tasks with Min A verbal cues.  SLP Short Term Goal 4 - Progress (Week 2): Met SLP Short Term Goal 5 (Week 2): Patient will self-monitor and correct errors at the phrase and sentence level with Min A verbal cues.  SLP Short Term Goal 5 - Progress (Week 2): Met SLP Short Term Goal 6 (Week 2): Patient will utulize word-finding strategies at the phrase level with Min A mulitmodal cues.  SLP Short Term Goal 6 - Progress (Week 2): Met    New Short Term Goals: Week 3: SLP Short Term Goal 1 (Week 3): STGs=LTGs  Weekly Progress Updates: Patient continues to make excellent gains and has met 6 of 6 STGs this reporting period. Currently, patient is consuming Dys. 3 textures with thin liquids with minimal overt s/s of aspiration with  overall supervision with plans for upgrade to regular textures this next reporting period. Patient continues to demonstrate improved verbal expression and expresses his wants/needs at the sentence level with extra time and Min A verbal cues to self-monitor and correct verbal errors. Patient also demonstrates improved cognitive functioning with increased problem solving, attention and awareness during functional tasks at an overall Min A verbal cues. Patient and family education ongoing. Patient would benefit from continued skilled SLP intervention to maximize his cognitive-linguistic and swallowing function prior to discharge.      Intensity: Minumum of 1-2 x/day, 30 to 90 minutes Frequency: 3 to 5 out of 7 days Duration/Length of Stay: 04/11/18 Treatment/Interventions: Cognitive remediation/compensation;Environmental controls;Internal/external aids;Speech/Language facilitation;Therapeutic Activities;Patient/family education;Functional tasks;Dysphagia/aspiration precaution training;Cueing hierarchy   Daily Session  Skilled Therapeutic Interventions: Skilled treatment session focused on cognitive goals. SLP facilitated session by providing encouragement and supervision level verbal cues for problem solving during tray set-up. Patient appeared to self-feed his meal more efficiently today with increased coordination of his RUE.  SLP also facilitated session by providing Min A verbal cues for emergent awareness in regards to safety and need for supervision when ambulating with RW. Patient's wife present and educated in regards to current progress and recommendations for f/u outpatinet services. Patient left upright in wheelchair with all needs within reach and wife present. Continue with current plan of care.     Pain No/Denies Pain   Therapy/Group: Individual Therapy  Alfonzia Woolum 04/04/2018, 2:39 PM

## 2018-04-04 NOTE — Progress Notes (Addendum)
Occupational Therapy Weekly Progress Note  Patient Details  Name: Alvin Hudson MRN: [id number] Date of Birth: November 10, 1935  Beginning of progress report period: March 28, 2018 End of progress report period: April 04, 2018  Today's Date: 04/04/2018 OT Individual Time: [phone] and  OT Individual Time Calculation (min): 60 min and   Patient has met 3 of 3 short term goals.  Alvin Hudson is progressing well each week. This week he has been able to complete all self care at a CGA to a min A level mostly due to decreased processing, endurance, coordination and balance.  He is on track to reaching S goals by discharge.  Patient continues to demonstrate the following deficits: decreased cardiorespiratoy endurance, decreased coordination and decreased motor planning, decreased motor planning, decreased problem solving, decreased memory and delayed processing and decreased standing balance, decreased postural control and decreased balance strategies and therefore will continue to benefit from skilled OT intervention to enhance overall performance with BADL.  Patient progressing toward long term goals..  Continue plan of care.  OT Short Term Goals Week 1:  OT Short Term Goal 1 (Week 1): Pt will complete toilet transfer with min A OT Short Term Goal 1 - Progress (Week 1): Not met OT Short Term Goal 2 (Week 1): Pt will complete LB dressing with mod A OT Short Term Goal 2 - Progress (Week 1): Met OT Short Term Goal 3 (Week 1): Pt will tolerate standing for 3 mins in preparation for BADL tasks OT Short Term Goal 3 - Progress (Week 1): Not met Week 2:  OT Short Term Goal 1 (Week 2): Pt will complete toilet transfers with min A OT Short Term Goal 1 - Progress (Week 2): Met OT Short Term Goal 2 (Week 2): Pt will stand for 1 grooming task with min A for balance OT Short Term Goal 2 - Progress (Week 2): Met OT Short Term Goal 3 (Week 2): Pt will complete 1/3 toileting steps OT Short Term Goal 3 - Progress  (Week 2): Met Week 3:  OT Short Term Goal 1 (Week 3): STGs = LTGs of S level  Skilled Therapeutic Interventions/Progress Updates:    Visit 1:  Pain: no c/o pain Pt's wife in room at start of session. Discussed his progress in therapy and how he is making good progress to meet his S goals.   Pt requested to shave. Pt stood at sink and doffed shirt in standing with S.   Pt stood at sink for 15 -20 min to brush teeth and complete 50% of the shaving using his R hand. Therapist assisted shaving area areas around chin, neck and under nose.  Pt was quite tired at this point. Pt sat and rested. During rest time, discussed his medical history and pt seems to have improving awareness of what happened to him. Pt donned shirt as he was cold. Pt was given the option to extend the session until 10 to complete his shower, but he stated he was feeling very tired and wanted to wait until 1115.  Pt requested to get back in bed. Transferred to bed with CGA and layed down without A.  Bed alarm set and all needs met.    Visit 2: Pain: no co pain Pt seen this session to have him continue his self care from earlier.  Pt was able to get out of bed with S and used RW to ambulate to toilet with CGA, pt then completed toileting with S and transferred to shower  to bathe with S.  He dried off and ambulated back to EOB to dress with S except for min A to manage brief. His wife ran out of his pull on briefs.   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  Pt worked on Set designer for the letters (L,i, T, e) in cursive. Pt stated he found the activity mentally challenging.  Also gave him "homework" to color in small areas of a coloring graphic.  Pt voiced he thinks these things are boring, but agreed to work on it to develop his R [facility]. Pt resting back in bed with bed alarm on.  Therapy Documentation Precautions:   Precautions Precautions: Fall Restrictions Weight Bearing Restrictions: No    Vital Signs: Therapy Vitals Temp: 98 F (36.7 C) Temp Source: Oral Pulse Rate: (!) 56 Resp: 18 BP: 104/75 Patient Position (if appropriate): Sitting Oxygen Therapy SpO2: 99 % O2 Device: Room Air     Therapy/Group: Individual Therapy  [place] 04/04/2018, 8:25 AM

## 2018-04-04 NOTE — Plan of Care (Signed)
  Problem: RH BOWEL ELIMINATION Goal: RH STG MANAGE BOWEL WITH ASSISTANCE Description STG Manage Bowel with Assistance. Mod  Outcome: Progressing   Problem: RH BLADDER ELIMINATION Goal: RH STG MANAGE BLADDER WITH ASSISTANCE Description STG Manage Bladder With Assistance. Mod  Outcome: Progressing   Problem: RH SKIN INTEGRITY Goal: RH STG SKIN FREE OF INFECTION/BREAKDOWN Description Free of breakdown, infection while on rehab mod assist.   Outcome: Progressing   Problem: RH SAFETY Goal: RH STG ADHERE TO SAFETY PRECAUTIONS W/ASSISTANCE/DEVICE Description STG Adhere to Safety Precautions With Assistance/Device.Mod  Outcome: Progressing   Problem: RH COGNITION-NURSING Goal: RH STG USES MEMORY AIDS/STRATEGIES W/ASSIST TO PROBLEM SOLVE Description STG Uses Memory Aids/Strategies With Assistance to Problem Solve. Mod  Outcome: Progressing   Problem: RH KNOWLEDGE DEFICIT BRAIN INJURY Goal: RH STG INCREASE KNOWLEDGE OF SELF CARE AFTER BRAIN INJURY Description Patient and family will be able to describe care of patient following BI with cues, handouts  Outcome: Progressing   Problem: Consults Goal: RH BRAIN INJURY PATIENT EDUCATION Description Description: See Patient Education module for eduction specifics Outcome: Progressing   

## 2018-04-04 NOTE — Progress Notes (Signed)
Physical Therapy Weekly Progress Note  Patient Details  Name: Alvin Hudson MRN: [id number] Date of Birth: [birth year]  Beginning of progress report period: March 28, 2018 End of progress report period: April 04, 2018   Patient has met 2 of 2 short term goals.  Pt improving gait, balance and mobility and is currently min A/ supervision with gait with RW and standing balance. Much improved attention and awareness, continues with decreased activity tolerance.  Patient continues to demonstrate the following deficits muscle weakness, decreased cardiorespiratoy endurance, decreased awareness, decreased problem solving, decreased memory and delayed processing and decreased standing balance, decreased postural control and decreased balance strategies and therefore will continue to benefit from skilled PT intervention to increase functional independence with mobility.  Patient progressing toward long term goals..  Continue plan of care.  PT Short Term Goals Week 2:  PT Short Term Goal 1 (Week 2): pt will perform functional transfers with min A PT Short Term Goal 1 - Progress (Week 2): Met PT Short Term Goal 2 (Week 2): pt will perform gait in controlled environment x 100' with min A PT Short Term Goal 2 - Progress (Week 2): Met Week 3:  PT Short Term Goal 1 (Week 3): = LTG  Skilled Therapeutic Interventions/Progress Updates:  Ambulation/gait training;Community reintegration;DME/adaptive equipment instruction;Stair training;UE/LE Strength taining/ROM;Wheelchair propulsion/positioning;Balance/vestibular training;Discharge planning;Functional electrical stimulation;Pain management;Therapeutic Activities;UE/LE Coordination activities;Therapeutic Exercise;Splinting/orthotics;Patient/family education;Functional mobility training;Cognitive remediation/compensation;Neuromuscular re-education     Alvin Hudson 04/04/2018, 2:47 PM

## 2018-04-04 NOTE — Progress Notes (Signed)
[facility] PHYSICAL MEDICINE & REHABILITATION PROGRESS NOTE  Subjective/Complaints: Overall doing well this morning.  Requested that he is able to get up to the bathroom on his own at night because he has to urinate 2-3 times typically.  Does not want to disturb nurses.  ROS: Patient denies fever, rash, sore throat, blurred vision, nausea, vomiting, diarrhea, cough, shortness of breath or chest pain, joint or back pain, headache, or mood change.   Objective: Vital Signs: Blood pressure 104/75, pulse (!) 56, temperature 98 F (36.7 C), temperature source Oral, resp. rate 18, height 5\' 11"  (1.803 m), weight 67 kg, SpO2 99 %. No results found. Recent Labs    04/03/18 0559  WBC 6.9  HGB 10.8*  HCT 33.5*  PLT 215   Recent Labs    04/03/18 0559  NA 139  K 4.2  CL 101  CO2 28  GLUCOSE 101*  BUN 13  CREATININE 0.87  CALCIUM 8.9    Physical Exam: BP 104/75 (BP Location: Left Arm)   Pulse (!) 56   Temp 98 F (36.7 C) (Oral)   Resp 18   Ht 5\' 11"  (1.803 m)   Wt 67 kg   SpO2 99%   BMI 20.60 kg/m  Constitutional: No distress . Vital signs reviewed. HEENT: EOMI, oral membranes moist Neck: supple Cardiovascular: RRR with murmur. No JVD    Respiratory: CTA Bilaterally without wheezes or rales. Normal effort    GI: BS +, non-tender, non-distended   Musc: No edema or tenderness in extremities. Neurological: alert Improving word finding but language far from fluids Motor: Grossly 4+/5 throughout, apraxic with RUE, stable in appearance Skin: See above  Psychiatric: Pleasant .  Assessment/Plan: 1. Functional deficits secondary to left SDH which require 3+ hours per day of interdisciplinary therapy in a comprehensive inpatient rehab setting.  Physiatrist is providing close team supervision and 24 hour management of active medical problems listed below.  Physiatrist and rehab team continue to assess barriers to discharge/monitor patient progress toward functional and medical  goals  Care Tool:  Bathing  Bathing activity did not occur: (did not occur.) Body parts bathed by patient: Right arm, Chest, Abdomen, Front perineal area, Buttocks, Right upper leg, Left upper leg, Face, Left arm, Right lower leg, Left lower leg   Body parts bathed by helper: Left lower leg     Bathing assist Assist Level: Supervision/Verbal cueing     Upper Body Dressing/Undressing Upper body dressing Upper body dressing/undressing activity did not occur (including orthotics): (did not occur; pt was already in bed.) What is the patient wearing?: Pull over shirt    Upper body assist Assist Level: Supervision/Verbal cueing    Lower Body Dressing/Undressing Lower body dressing    Lower body dressing activity did not occur: (did not occur.) What is the patient wearing?: Incontinence brief, Pants     Lower body assist Assist for lower body dressing: Minimal Assistance - Patient > 75%     Toileting Toileting    Toileting assist Assist for toileting: Minimal Assistance - Patient > 75%     Transfers Chair/bed transfer  Transfers assist  Chair/bed transfer activity did not occur: (did not occur.)  Chair/bed transfer assist level: Supervision/Verbal cueing     Locomotion Ambulation   Ambulation assist      Assist level: Supervision/Verbal cueing Assistive device: Walker-rolling Max distance: 150   Walk 10 feet activity   Assist     Assist level: Supervision/Verbal cueing Assistive device: Walker-rolling   Walk 50  feet activity   Assist Walk 50 feet with 2 turns activity did not occur: Safety/medical concerns  Assist level: Supervision/Verbal cueing Assistive device: Walker-rolling    Walk 150 feet activity   Assist Walk 150 feet activity did not occur: Safety/medical concerns  Assist level: Supervision/Verbal cueing Assistive device: Walker-rolling    Walk 10 feet on uneven surface  activity   Assist Walk 10 feet on uneven surfaces  activity did not occur: Safety/medical concerns         Wheelchair     Assist Will patient use wheelchair at discharge?: No Type of Wheelchair: Manual    Wheelchair assist level: Dependent - Patient 0% Max wheelchair distance: 25    Wheelchair 50 feet with 2 turns activity    Assist    Wheelchair 50 feet with 2 turns activity did not occur: Safety/medical concerns       Wheelchair 150 feet activity     Assist Wheelchair 150 feet activity did not occur: Safety/medical concerns          Medical Problem List and Plan: 1.  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 Status post left frontotemporal parietal craniotomy evacuation of subdural hematoma 03/10/2018  Continue CIR. team conference today.  -Asked the patient to discuss his mobility questions with physical therapy.  It might be suitable for his wife to assist him to the bathroom although then she spoke up to her own physical deficits 2.  Antithrombotics: -DVT/anticoagulation:  SCDs. No anticoagulation due to subdural hematoma             -antiplatelet therapy: Not applicable 3. Pain Management:  Hydrocodone as needed 4. Mood:  Zoloft 100 mg daily, Restoril 15 mg daily at bedtime             -antipsychotic agents: not applicable  -team providing egosupport as possible  5. Neuropsych: This patient is not capable of making decisions on his own behalf. 6. Skin/Wound Care:  Routine skin checks 7. Fluids/Electrolytes/Nutrition:  BMP within acceptable range on 3/9.  Good p.o. intake at present.   8. Seizure disorder. Keppra 1500 mg twice a day, Vimpat 200 mg twice a day, valproic acid 500 mg every 8 hours.   -depakote level therapeutic  -Plan to continue this regimen to follow up outpatient with neurology services. 9. New-onset atrial fibrillation with RVR. Cardizem initiated 60 mg every 6 hours 03/16/2018. Cardiac rate control. No anticoagulation due to subdural hematoma.  Follow-up cardiology services Dr. Garnette Scheuermann  -HR controlled on 3/10 10. Dysphagia. Dysphagia #3 thin liquids.   Advance diet per SLP, intake improving 11. Constipation: Bowel movement on 3/ 9  -changed regimen to senkot-s 2 tabs at hs 13.  Hypoalbuminemia  Supplement initiated on 2/25, eating well 14.  Anemia with macrocytosis  Hemoglobin 11.8 on 03/24/2018, 10.8   3/9  Vit B12 WNL  Folate normal on 03/24/2018  LOS: 15 days A FACE TO FACE EVALUATION WAS PERFORMED  Ranelle Oyster 04/04/2018, 8:39 AM

## 2018-04-04 NOTE — Progress Notes (Signed)
Nutrition Follow-up  DOCUMENTATION CODES:   Severe malnutrition in context of acute illness/injury  INTERVENTION:   - Ensure Enlive po q 24 hours, each supplement provides 350 kcal and 20 grams of protein  - Continue Pro-stat 30 ml BID, each supplement provides 100 kcal and 15 grams of protein  - Continue Magic cup BID with meals, each supplement provides 290 kcal and 9 grams of protein  NUTRITION DIAGNOSIS:   Severe Malnutrition related to acute illness (left SDH s/p crani and resulting dysphagia) as evidenced by mild fat depletion, mild muscle depletion, moderate muscle depletion, severe muscle depletion, percent weight loss (8.1% weight loss in less than 1 month).  Ongoing  GOAL:   Patient will meet greater than or equal to 90% of their needs  Progressing  MONITOR:   PO intake, Supplement acceptance, Diet advancement, Skin, Labs, Weight trends  REASON FOR ASSESSMENT:   Malnutrition Screening Tool, Consult Enteral/tube feeding initiation and management  ASSESSMENT:   83 year old male with PMH significant for seizures on Keppra. Pt admitted to [facility] on 2/16 with L SDH s/p crani 2/14. Pt was discharged 2/16 but readmitted that same day with AMS/aphasia and afib with RVR. Pt had Cortrak placed on 2/19 for enteral nutrition. Pt had MBS 2/24 with recommendations for Dysphagia 2 diet with thin liquids. Pt admitted to CIR on 2/24.  2/25 - Cortrak removed  Weight up 5 lbs overall since admission to CIR.  Spoke with pt's wife at bedside. Pt sleeping soundly at time of visit and did not awaken during conversation.  Pt's wife shares that she spoke with SLP who is hopeful for pt to transition to a Regular diet prior to d/c. Pt's wife shares that pt is getting tired of green beans as the only available vegetable for pt and that pt would like banana pudding. Unfortunately, banana pudding not available on dysphagia 3 diet.  Pt's wife shares that pt occasionally drinks 50% of an Ensure  Enlive. Will decrease order from BID to q 24 hours. Pt's wife reports pt very much enjoys Biochemist, clinical with meals.  Pt's wife states that pt has reported and increased appetite and food tasting much better.  Meal Completion: 75-100% x last 8 recorded meals  Medications reviewed and include: B complex with vitamin C, Ensure Enlive BID, Pro-stat 30 ml BID, Fibercon, Miralax, Senna  Labs reviewed.  Diet Order:   Diet Order            DIET DYS 3 Room service appropriate? Yes with Assist; Fluid consistency: Thin  Diet effective 1000              EDUCATION NEEDS:   Not appropriate for education at this time  Skin:  Skin Assessment: (closed incision to head)  Last BM:  3/9  Height:   Ht Readings from Last 1 Encounters:  03/20/18 5\' 11"  (1.803 m)    Weight:   Wt Readings from Last 1 Encounters:  04/04/18 67 kg    Ideal Body Weight:  78.2 kg  BMI:  Body mass index is 20.6 kg/m.  Estimated Nutritional Needs:   Kcal:  1850-2050  Protein:  90-105 grams  Fluid:  >/= 1.8 L    Earma Reading, MS, RD, LDN Inpatient Clinical Dietitian Pager: [phone] Weekend/After Hours: [phone]

## 2018-04-04 NOTE — Progress Notes (Signed)
Physical Therapy Session Note  Patient Details  Name: Alvin Hudson MRN: [id number] Date of Birth: Aug 07, 1935  Today's Date: 04/04/2018 PT Individual Time: 0354-6568 PT Individual Time Calculation (min): 54 min   Short Term Goals: Week 2:  PT Short Term Goal 1 (Week 2): pt will perform functional transfers with min A PT Short Term Goal 2 (Week 2): pt will perform gait in controlled environment x 100' with min A  Skilled Therapeutic Interventions/Progress Updates:    Pt supine in bed upon PT arrival, agreeable to therapy tx and denies pain. Pt transferred to sitting EOB with supervision and donned shoes while seated EOB with min assist. Pt ambulated to the gym with RW and CGA x 200 ft, verbal cues for upright posture and increased step length. PT worked on dynamic standing balance this session without UE support in order to perform ambulation without AD x 80 ft, backwards ambulation x 20 ft, ambulation with eyes closed x 25 ft, standing on airex while tossing horseshoes, standing on airex feet apart eyes open/eyes closed, standing on airex feet together eyes open/eyes closed and standing on red wedge, all with min-mod assist for balance. Pt performed x 10 sit<>stands from mat without UE support for LE strengthening, cues for full extension in standing. Pt ambulated back to room with RW and CGA x 200 ft. Pt ambulated into bathroom without AD and min assist, performed clothing management and peri care without assist, min guard for standing balance, continent of bowel and bladder. Pt ambulated to sink to wash hands, min guard for standing balance. Pt ambulated to bed and transferred to supine, left supine with needs in reach and bed alarm set.   Therapy Documentation Precautions:  Precautions Precautions: Fall Restrictions Weight Bearing Restrictions: No    Therapy/Group: Individual Therapy  Cresenciano Genre, PT, DPT 04/04/2018, 9:32 AM

## 2018-04-05 ENCOUNTER — Inpatient Hospital Stay (HOSPITAL_COMMUNITY): Payer: Medicare Other | Admitting: Speech Pathology

## 2018-04-05 ENCOUNTER — Inpatient Hospital Stay (HOSPITAL_COMMUNITY): Payer: Medicare Other | Admitting: Physical Therapy

## 2018-04-05 ENCOUNTER — Inpatient Hospital Stay (HOSPITAL_COMMUNITY): Payer: Medicare Other

## 2018-04-05 ENCOUNTER — Encounter (HOSPITAL_COMMUNITY): Payer: Medicare Other | Admitting: Psychology

## 2018-04-05 NOTE — Progress Notes (Signed)
Pt given tylenol for generalized aching with relief. Pt has been mostly continent. Pt did have one episode of incontinence as he was going to the bathroom. Pt was given pericare and pants were changed. Pt also voided in the BR. Pt's wife is with the patient. Pt is ambulating well with RW and one assist. No further complaints. Pt is resting.

## 2018-04-05 NOTE — Progress Notes (Signed)
Physical Therapy Session Note  Patient Details  Name: Alvin Hudson MRN: [id number] Date of Birth: 1935-09-02  Today's Date: 04/05/2018 PT Individual Time: 0815-0905 PT Individual Time Calculation (min): 50 min   Short Term Goals: Week 3:  PT Short Term Goal 1 (Week 3): = LTG  Skilled Therapeutic Interventions/Progress Updates:  Pt presented in w/c eating breakfast and requesting to return after meal. Pt returned 10 minutes later agreeable to therapy. Pt then requesting use of bathroom. Performed STS transfer from w/c and ambulatory transfer to toilet with supervision, pt able to perform clothing management and peri-care distant S/mod I. Pt stating feeling very fatigued this am so PTA transported pt to rehab gym. Pt participated in standing static and dynamic balance activities including toe taps to cones with some increased difficulty coordinating movements R>L, alternating toe taps, placing clothespins on basketball net with RUE while standing on Airex. Pt required intermittent cues not to place LUE on supporting surface.  Pt also participated in gait activities including sidestepping and backwards walking no AD. Pt required seated breaks throughout session due to fatigue. Pt ambulated back to room with RW and CGA and requesting to return to bed. Performed bed mobility with supervision and use of bed features. Pt left in bed at end of session with bed alarm on, needs met and wife present.      Therapy Documentation Precautions:  Precautions Precautions: Fall Restrictions Weight Bearing Restrictions: No General: PT Amount of Missed Time (min): 10 Minutes PT Missed Treatment Reason: Other (Comment)(eating)    Therapy/Group: Individual Therapy  Dartha Rozzell  Treasa Bradshaw, PTA  04/05/2018, 3:58 PM

## 2018-04-05 NOTE — Plan of Care (Signed)
  Problem: RH BOWEL ELIMINATION Goal: RH STG MANAGE BOWEL WITH ASSISTANCE Description STG Manage Bowel with Assistance. Mod  Outcome: Progressing   Problem: RH BLADDER ELIMINATION Goal: RH STG MANAGE BLADDER WITH ASSISTANCE Description STG Manage Bladder With Assistance. Mod  Outcome: Progressing   Problem: RH SKIN INTEGRITY Goal: RH STG SKIN FREE OF INFECTION/BREAKDOWN Description Free of breakdown, infection while on rehab mod assist.   Outcome: Progressing   Problem: RH SAFETY Goal: RH STG ADHERE TO SAFETY PRECAUTIONS W/ASSISTANCE/DEVICE Description STG Adhere to Safety Precautions With Assistance/Device.Mod  Outcome: Progressing   Problem: RH COGNITION-NURSING Goal: RH STG USES MEMORY AIDS/STRATEGIES W/ASSIST TO PROBLEM SOLVE Description STG Uses Memory Aids/Strategies With Assistance to Problem Solve. Mod  Outcome: Progressing   Problem: RH KNOWLEDGE DEFICIT BRAIN INJURY Goal: RH STG INCREASE KNOWLEDGE OF SELF CARE AFTER BRAIN INJURY Description Patient and family will be able to describe care of patient following BI with cues, handouts  Outcome: Progressing   Problem: Consults Goal: RH BRAIN INJURY PATIENT EDUCATION Description Description: See Patient Education module for eduction specifics Outcome: Progressing   

## 2018-04-05 NOTE — Progress Notes (Signed)
[place] PHYSICAL MEDICINE & REHABILITATION PROGRESS NOTE  Subjective/Complaints: Overall doing well this morning.  Requested that he is able to get up to the bathroom on his own at night because he has to urinate 2-3 times typically.  Does not want to disturb nurses.  ROS: Patient denies fever, rash, sore throat, blurred vision, nausea, vomiting, diarrhea, cough, shortness of breath or chest pain, joint or back pain, headache, or mood change.   Objective: Vital Signs: Blood pressure 130/73, pulse 64, temperature 98.1 F (36.7 C), temperature source Oral, resp. rate 18, height 5\' 11"  (1.803 m), weight 67.2 kg, SpO2 98 %. No results found. Recent Labs    04/03/18 0559  WBC 6.9  HGB 10.8*  HCT 33.5*  PLT 215   Recent Labs    04/03/18 0559  NA 139  K 4.2  CL 101  CO2 28  GLUCOSE 101*  BUN 13  CREATININE 0.87  CALCIUM 8.9    Physical Exam: BP 130/73 (BP Location: Left Arm)   Pulse 64   Temp 98.1 F (36.7 C) (Oral)   Resp 18   Ht 5\' 11"  (1.803 m)   Wt 67.2 kg   SpO2 98%   BMI 20.67 kg/m  Constitutional: No distress . Vital signs reviewed. HEENT: EOMI, oral membranes moist Neck: supple Cardiovascular: IRR without murmur. No JVD    Respiratory: CTA Bilaterally without wheezes or rales. Normal effort    GI: BS +, non-tender, non-distended   Musc: No edema or tenderness in extremities. Neurological: alert Improving word finding but language far from fluids Motor: Grossly 4+/5 throughout, apraxic with RUE, stable to improved Skin: See above  Psychiatric: up beat, laughing.  Assessment/Plan: 1. Functional deficits secondary to left SDH which require 3+ hours per day of interdisciplinary therapy in a comprehensive inpatient rehab setting.  Physiatrist is providing close team supervision and 24 hour management of active medical problems listed below.  Physiatrist and rehab team continue to assess barriers to discharge/monitor patient progress toward functional and  medical goals  Care Tool:  Bathing  Bathing activity did not occur: (did not occur.) Body parts bathed by patient: Right arm, Chest, Abdomen, Front perineal area, Buttocks, Right upper leg, Left upper leg, Face, Left arm, Right lower leg, Left lower leg   Body parts bathed by helper: Left lower leg     Bathing assist Assist Level: Supervision/Verbal cueing     Upper Body Dressing/Undressing Upper body dressing Upper body dressing/undressing activity did not occur (including orthotics): (did not occur; pt was already in bed.) What is the patient wearing?: Pull over shirt    Upper body assist Assist Level: Supervision/Verbal cueing    Lower Body Dressing/Undressing Lower body dressing    Lower body dressing activity did not occur: (did not occur.) What is the patient wearing?: Incontinence brief, Pants     Lower body assist Assist for lower body dressing: Minimal Assistance - Patient > 75%(S pants, min for brief)     Toileting Toileting    Toileting assist Assist for toileting: Supervision/Verbal cueing     Transfers Chair/bed transfer  Transfers assist  Chair/bed transfer activity did not occur: (did not occur.)  Chair/bed transfer assist level: Contact Guard/Touching assist     Locomotion Ambulation   Ambulation assist      Assist level: Contact Guard/Touching assist Assistive device: Walker-rolling Max distance: 150   Walk 10 feet activity   Assist     Assist level: Contact Guard/Touching assist Assistive device: Walker-rolling  Walk 50 feet activity   Assist Walk 50 feet with 2 turns activity did not occur: Safety/medical concerns  Assist level: Contact Guard/Touching assist Assistive device: Walker-rolling    Walk 150 feet activity   Assist Walk 150 feet activity did not occur: Safety/medical concerns  Assist level: Contact Guard/Touching assist Assistive device: Walker-rolling    Walk 10 feet on uneven surface   activity   Assist Walk 10 feet on uneven surfaces activity did not occur: Safety/medical concerns         Wheelchair     Assist Will patient use wheelchair at discharge?: No Type of Wheelchair: Manual    Wheelchair assist level: Dependent - Patient 0% Max wheelchair distance: 25    Wheelchair 50 feet with 2 turns activity    Assist    Wheelchair 50 feet with 2 turns activity did not occur: Safety/medical concerns       Wheelchair 150 feet activity     Assist Wheelchair 150 feet activity did not occur: Safety/medical concerns          Medical Problem List and Plan: 1.  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 Status post left frontotemporal parietal craniotomy evacuation of subdural hematoma 03/10/2018  -Continue CIR therapies including PT, OT, and SLP .  -making steady progress 2.  Antithrombotics: -DVT/anticoagulation:  SCDs. No anticoagulation due to subdural hematoma             -antiplatelet therapy: Not applicable 3. Pain Management:  Hydrocodone as needed 4. Mood:  Zoloft 100 mg daily, Restoril 15 mg daily at bedtime             -antipsychotic agents: not applicable   -mood improving 5. Neuropsych: This patient is not capable of making decisions on his own behalf. 6. Skin/Wound Care:  Routine skin checks 7. Fluids/Electrolytes/Nutrition:  BMP within acceptable range on 3/9.  Good p.o. intake at present.   8. Seizure disorder. Keppra 1500 mg twice a day, Vimpat 200 mg twice a day, valproic acid 500 mg every 8 hours.   -depakote level therapeutic  -Plan to continue this regimen to follow up outpatient with neurology services. 9. New-onset atrial fibrillation with RVR. Cardizem initiated 60 mg every 6 hours 03/16/2018. Cardiac rate control. No anticoagulation due to subdural hematoma. Follow-up cardiology services Dr. Garnette Scheuermann  -HR controlled on 3/11 10. Dysphagia. Dysphagia #3 thin liquids.   Advance diet per  SLP, intake improving 11. Constipation: Bowel movement on 3/ 9  -changed regimen to senkot-s 2 tabs at hs 13.  Hypoalbuminemia  Supplement initiated on 2/25, eating well 14.  Anemia with macrocytosis  Hemoglobin 11.8 on 03/24/2018, 10.8   3/9  Vit B12 WNL  Folate normal on 03/24/2018  LOS: 16 days A FACE TO FACE EVALUATION WAS PERFORMED  Ranelle Oyster 04/05/2018, 8:56 AM

## 2018-04-05 NOTE — Progress Notes (Signed)
Occupational Therapy Session Note  Patient Details  Name: Alvin Hudson MRN: [id number] Date of Birth: 29-Jan-1935  Today's Date: 04/05/2018 OT Individual Time: 1497-0263 OT Individual Time Calculation (min): 71 min    Short Term Goals: Week 2:  OT Short Term Goal 1 (Week 2): Pt will complete toilet transfers with min A OT Short Term Goal 1 - Progress (Week 2): Met OT Short Term Goal 2 (Week 2): Pt will stand for 1 grooming task with min A for balance OT Short Term Goal 2 - Progress (Week 2): Met OT Short Term Goal 3 (Week 2): Pt will complete 1/3 toileting steps OT Short Term Goal 3 - Progress (Week 2): Met  Skilled Therapeutic Interventions/Progress Updates:    1;1. Pt completes ambulation throughout session with RW and CGA-S overall. Pt completes toileting at [place] level for clothing management. Pt completes bathing sit to stand with supervision utilizing grab bar for steadying. Pt dresses EOB with S overall for UB, LB and footwear. Pt completes grooming (shaving and oral care at sink) with supervision. Pt requires seated rest break after standing ~10 min for grooming. Pt ambulates to/from dayroom with S-CGA and completes 9HPT RUE: 1 min 17 secon and LUE 48.43 seconds. Exited session with pt seate din bed, exit alarm on and call light.   Therapy Documentation Precautions:  Precautions Precautions: Fall Restrictions Weight Bearing Restrictions: No General:   Vital Signs:   Pain:   ADL: ADL Eating: Unable to assess Grooming: Maximal cueing, Moderate assistance Upper Body Bathing: Moderate cueing, Moderate assistance Lower Body Bathing: Maximal assistance, Moderate cueing Upper Body Dressing: Moderate cueing, Moderate assistance Lower Body Dressing: Moderate cueing, Maximal assistance Toileting: Unable to assess Toilet Transfer: Moderate assistance Social research officer, government: Unable to assess Vision   Perception    Praxis   Exercises:   Other Treatments:      Therapy/Group: Individual Therapy  Tonny Branch 04/05/2018, 2:20 PM

## 2018-04-05 NOTE — Consult Note (Signed)
Neuropsychological Consultation   Patient:   Alvin Hudson   DOB:   1935/07/23  MR Number:  [id number]  Location:  [facility] [address] CENTER A [id number] [place] [address] Dept: [phone] Loc: [phone]           Date of Service:   04/05/2018  Start Time:   11 AM End Time:   12 PM  Provider/Observer:  Arley Phenix, Psy.D.       Clinical Neuropsychologist       Billing Code/Service: [phone]  Chief Complaint:    Alvin Hudson is an 83 year old male with history os seizure 2 years ago and maintained on Keppra.  Initially presented on 03/10/2018 after recent fall with initial CT negative for acute process.  Patient developed progressive headaches.  Taken to [facility] and had CT and attempts to transfer to [facility] or [facility].  Could not be accommodated due to full ICU.  Imaging showed acute left subdural hematoma.  Transferred to [facility] and underwent left frontotemporal parietal craniotomy for evacuation of subdural hematoma on 03/10/2018 by Dr. Lovell Sheehan.  Discharged home on 03/12/2018.  Patient readmitted same day after family reports decrease in mobility as well as speech difficulty and dysphagia.  Follow-up CT/MRI showed left frontal extra-axial hematoma.  Per report a 7 mm extra-axial hematoma over the left convexity as previously demonstrated on earlier CT scans the same day with no new site of hemorrhage or acute ischemia.  Neurology and neurosurgery were consulted and they suspected possible paralysis from seizure occurred once he had gone home.  The patient did have a seizure noted on 03/15/2018.  Follow-up EEG was negative for seizure.    Reason for Service:  The patient was referred for neuropsychologial consultation due to coping and adjustment issues.  Below is the HPI for the current admission.  HPI: Alvin Hudson is an 83 year old right handed male with history of seizure 2 years ago maintained on Keppra followed by Dr.  Rulon Eisenmenger at [facility]. History taken from chart review and wife.  Initially presented 03/10/2018 after a recent fall initial cranial CT scan negative. Patient lives with spouse. Independent prior to initial admission of 03/10/2018. Patient is very active and drives. Two level home with 2 steps to entry bedroom on main level. He developed progressive headaches. He was taken to [facility] or scans were obtained attempts initially made to transfer to [facility] and [facility] but they could not accommodate him because the ICU was full. X-rays and imaging follow-up  at [facility] showed acute left subdural hematoma. Patient was transferred to [facility] and underwent left frontotemporal parietal craniotomy for evacuation of subdural hematoma 03/10/2018 per Dr. Tressie Stalker. Patient progressed nicely and initial therapy evaluation and ambulating 500 feet without assistive device and was discharged home 03/12/2018. Patient was readmitted same day after family reports decrease in mobility as well as speech difficulty and dysphagia. Follow-up CT/MRI reviewed, showing left frontal extra-axial hematoma.  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 No intracranial large vessel occlusion. 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 Was an incidental note of moderate right small left pleural effusions with findings  of pulmonary edema with follow-up chest x-ray showing small bilateral pleural effusions and bibasilar atelectasis no change in plan of care. Noted seizure on 03/15/2018.Follow-up EEGs negative for seizure. Currently maintained on Keppra, valproic acid as well as Vimpat for  seizure prophylaxis.plans to currently continue diet epileptic regimen until follow-up as outpatient with neurology.  Patient new-onset A. Fib with RVR initially placed on Cardizem drip transition to Cardizem. Troponin mildly elevated 0.04-0.15 felt to be related to demand ischemia. Echocardiogram with ejection fraction of 65%. Normal systolic function. Patient is currently NPO with nasogastric tube in place for nutritional support with follow-up modified barium swallow completed 03/20/2018 placed on a dysphagia #2 thin liquid diet. Therapy evaluations completed with recommendations of physical medicine rehabilitation consult. Patient was admitted for a cooperative rehabilitation program. Please also see preadmission assessment from today.  Current Status:  The patient was oriented today displayed appriate interactions.  Did report some coping issues but overall reports that he is improving.  The patient has shown improved expressive language but still has word finding and fluency issues.  He is able to adequately express self with minimal assistant and cueing.  Patient acknowledges mild coping issues and concerns and return to pre-levels of functioning.    Behavioral Observation: Alvin Hudson  presents as a 83 y.o.-year-old Right handed  Male who appeared his stated age. his dress was Appropriate and he was Well Groomed and his manners were Appropriate to the situation.  his participation was indicative of Appropriate and Attentive behaviors.  There were any physical disabilities noted.  he displayed an appropriate level of cooperation and motivation.     Interactions:    Active Appropriate  Attention:   within normal limits and attention span and concentration were age appropriate  Memory:   within normal limits; recent and remote memory intact  Visuo-spatial:  not examined  Speech (Volume):  low  Speech:   non-fluent aphasia; slowed response times with word finding issues.    Thought  Process:  Coherent and Relevant  Though Content:  WNL; not suicidal and not homicidal  Orientation:   person, place, time/date and situation  Judgment:   Good  Planning:   Fair  Affect:    Anxious  Mood:    Anxious  Insight:   Good  Intelligence:   normal  Medical History:   Past Medical History:  Diagnosis Date  . Allergy   . Seizures (HCC)      Psychiatric History:  No prior psychiatric history.  Family Med/Psych History: History reviewed. No pertinent family history.  Risk of Suicide/Violence: virtually non-existent Patient denies SI or HI.  Impression/DX:  Alvin Hudson is an 83 year old male with history os seizure 2 years ago and maintained on Keppra.  Initially presented on 03/10/2018 after recent fall with initial CT negative for acute process.  Patient developed progressive headaches.  Taken to [facility] and had CT and attempts to transfer to [facility].  Could not be accommodated due to full ICU.  Imaging showed acute left subdural hematoma.  Transferred to [facility] and underwent left frontotemporal parietal craniotomy for evacuation of subdural hematoma on 03/10/2018 by Dr. Lovell Sheehan.  Discharged home on 03/12/2018.  Patient readmitted same day after family reports decrease in mobility as well as speech difficulty and dysphagia.  Follow-up CT/MRI showed left frontal extra-axial hematoma.  Per report a 7 mm extra-axial hematoma over the left convexity as previously demonstrated on earlier CT scans the same day with no new site of  hemorrhage or acute ischemia.  Neurology and neurosurgery were consulted and they suspected possible paralysis from seizure occurred once he had gone home.  The patient did have a seizure noted on 03/15/2018.  Follow-up EEG was negative for seizure.   The patient was oriented today displayed appriate interactions.  Did report some coping issues but overall reports that he is improving.  The patient has shown improved expressive language but still has word finding  and fluency issues.  He is able to adequately express self with minimal assistant and cueing.  Patient acknowledges mild coping issues and concerns and return to pre-levels of functioning.          Electronically Signed   _______________________ Arley Phenix, Psy.D.

## 2018-04-05 NOTE — Progress Notes (Signed)
Speech Language Pathology Daily Session Note  Patient Details  Name: Alvin Hudson MRN: [id number] Date of Birth: 19-Dec-1935  Today's Date: 04/05/2018 SLP Individual Time: 0930-1030 SLP Individual Time Calculation (min): 60 min  Short Term Goals: Week 3: SLP Short Term Goal 1 (Week 3): STGs=LTGs  Skilled Therapeutic Interventions: Skilled treatment session focused on dysphagia and speech goals. SLP facilitated session by providing skilled observation with snack of regular textures with thin liquids via straw. Patient consumed snack with efficient mastication and complete oral clearance without overt s/s of aspiration. Therefore, recommend patient upgrade to regular textures and liquids via straw. Patient able to alternate attention between self-feeding and mildly complex conversation with overall supervision level verbal cues and extra time to self-monitor and correct errors. Patient left upright in wheelchair with wife present. Continue with current plan of care.      Pain No/Denies Pain   Therapy/Group: Individual Therapy  [name] 04/05/2018, 3:20 PM

## 2018-04-06 ENCOUNTER — Inpatient Hospital Stay (HOSPITAL_COMMUNITY): Payer: Medicare Other

## 2018-04-06 ENCOUNTER — Inpatient Hospital Stay (HOSPITAL_COMMUNITY): Payer: Medicare Other | Admitting: Physical Therapy

## 2018-04-06 ENCOUNTER — Inpatient Hospital Stay (HOSPITAL_COMMUNITY): Payer: Medicare Other | Admitting: Speech Pathology

## 2018-04-06 NOTE — Progress Notes (Signed)
Speech Language Pathology Daily Session Note  Patient Details  Name: Alvin Hudson MRN: [id number] Date of Birth: October 13, 1935  Today's Date: 04/06/2018 SLP Individual Time: 7062-3762 SLP Individual Time Calculation (min): 55 min  Short Term Goals: Week 3: SLP Short Term Goal 1 (Week 3): STGs=LTGs  Skilled Therapeutic Interventions: Skilled treatment session focused on cognitive goals. SLP facilitated session by administering the Cognistat. Patient scored [facility] for repetition, naming and reasoning but demonstrated mild deficits in attention, memory and judgment. Patient educated on results of test and verbalized understanding. Patient requested to use bathroom and performed all basic self-care tasks with supervision for safety. Patient transferred back to bed at end of session and left with alarm on. Continue with current plan of care.      Pain Pain Assessment Pain Scale: 0-10 Pain Score: 0-No pain  Therapy/Group: Individual Therapy  Alvin Hudson 04/06/2018, 12:35 PM

## 2018-04-06 NOTE — Progress Notes (Signed)
Physical Therapy Session Note  Patient Details  Name: Alvin Hudson MRN: [id number] Date of Birth: 21-Alvin-1937  Today's Date: 04/06/2018 PT Individual Time: 0830-0930 PT Individual Time Calculation (min): 60 min   Short Term Goals: Week 3:  PT Short Term Goal 1 (Week 3): = LTG  Skilled Therapeutic Interventions/Progress Updates:    pt rec'd in chair, stating he would like to eat breakfast. Pt performs gait to day room with frequent standing rest breaks due to inattention, RW and min A.  Pt eats breakfast with occasional assist for opening packages, cues to use Rt UE to improve coordination.  Much improved self monitoring of taking small bites and pacing appropriately.  Pt performs gait with RW 150' x 2 with supervision.  Gait without AD 100' x 2 with CGA, much improved posture and balance noted.  Stair negotiation x 12 stairs with bilat handrails with CGA.  Pt left in bed with alarm set, needs at hand.  Therapy Documentation Precautions:  Precautions Precautions: Fall Restrictions Weight Bearing Restrictions: No Pain:  no c/o pain   Therapy/Group: Individual Therapy  Alvin Hudson 04/06/2018, 11:07 AM

## 2018-04-06 NOTE — Progress Notes (Signed)
Occupational Therapy Session Note  Patient Details  Name: Alvin Hudson MRN: [id number] Date of Birth: 06-Mar-1935  Today's Date: 04/06/2018 OT Individual Time: 1400-1515 OT Individual Time Calculation (min): 75 min    Short Term Goals: Week 1:  OT Short Term Goal 1 (Week 1): Pt will complete toilet transfer with min A OT Short Term Goal 1 - Progress (Week 1): Not met OT Short Term Goal 2 (Week 1): Pt will complete LB dressing with mod A OT Short Term Goal 2 - Progress (Week 1): Met OT Short Term Goal 3 (Week 1): Pt will tolerate standing for 3 mins in preparation for BADL tasks OT Short Term Goal 3 - Progress (Week 1): Not met  Skilled Therapeutic Interventions/Progress Updates:    1:1. Pt received in room finishing lunch. Pt self feeds with RUE without read foam handle with supervision for swallowing strategies. Pt requires 1 VC for finishing bite prior to taking second bite. After finishing meal, pt ambulates to bathroom with MIN A and voids bowel and bladder on toilet. Pt completes FMC/strengthening activites with med soft pink theraputty 10 bead search, finger spread, full grip and tip to tip pinch. Handout provided with pictures for demonstration of theraputty exercises. Pt completes finger<>palm translation of scrabble tiles for improved FMC/coordination with demonstration cueing and VC for decreasing compensatory strategies. Exited session with pt seated in bed, exit alarm on and call light in reach  Therapy Documentation Precautions:  Precautions Precautions: Fall Restrictions Weight Bearing Restrictions: No General:   Vital Signs: Therapy Vitals BP: 124/72 Pain:   no pain reported ADL:  Vision   Perception    Praxis   Exercises:   Other Treatments:     Therapy/Group: Individual Therapy  Tonny Branch 04/06/2018, 3:04 PM

## 2018-04-06 NOTE — Progress Notes (Signed)
[place] PHYSICAL MEDICINE & REHABILITATION PROGRESS NOTE  Subjective/Complaints: No new complaints. Needed to go urgenlty to the bathroom when I arrived. Making steady gains  ROS: Patient denies fever, rash, sore throat, blurred vision, nausea, vomiting, diarrhea, cough, shortness of breath or chest pain, joint or back pain, headache, or mood change.    Objective: Vital Signs: Blood pressure 120/70, pulse (!) 55, temperature 97.8 F (36.6 C), temperature source Oral, resp. rate 16, height 5\' 11"  (1.803 m), weight 66.2 kg, SpO2 96 %. No results found. No results for input(s): WBC, HGB, HCT, PLT in the last 72 hours. No results for input(s): NA, K, CL, CO2, GLUCOSE, BUN, CREATININE, CALCIUM in the last 72 hours.  Physical Exam: BP 120/70 (BP Location: Left Arm)   Pulse (!) 55   Temp 97.8 F (36.6 C) (Oral)   Resp 16   Ht 5\' 11"  (1.803 m)   Wt 66.2 kg   SpO2 96%   BMI 20.36 kg/m  Constitutional: No distress . Vital signs reviewed. HEENT: EOMI, oral membranes moist Neck: supple Cardiovascular: IRR without murmur. No JVD    Respiratory: CTA Bilaterally without wheezes or rales. Normal effort    GI: BS +, non-tender, non-distended   Musc: No edema or tenderness in extremities. Neurological: alert Language fluency continues to improve Motor: Grossly 4+/5 throughout, apraxic with RUE. Reasonable walking balance with walker. A little impulsive still Skin: See above  Psychiatric: more engaging.  Assessment/Plan: 1. Functional deficits secondary to left SDH which require 3+ hours per day of interdisciplinary therapy in a comprehensive inpatient rehab setting.  Physiatrist is providing close team supervision and 24 hour management of active medical problems listed below.  Physiatrist and rehab team continue to assess barriers to discharge/monitor patient progress toward functional and medical goals  Care Tool:  Bathing  Bathing activity did not occur: (did not occur.) Body  parts bathed by patient: Right arm, Chest, Abdomen, Front perineal area, Buttocks, Right upper leg, Left upper leg, Face, Left arm, Right lower leg, Left lower leg   Body parts bathed by helper: Left lower leg     Bathing assist Assist Level: Supervision/Verbal cueing     Upper Body Dressing/Undressing Upper body dressing Upper body dressing/undressing activity did not occur (including orthotics): (did not occur; pt was already in bed.) What is the patient wearing?: Pull over shirt    Upper body assist Assist Level: Supervision/Verbal cueing    Lower Body Dressing/Undressing Lower body dressing    Lower body dressing activity did not occur: (did not occur.) What is the patient wearing?: Incontinence brief, Pants     Lower body assist Assist for lower body dressing: Minimal Assistance - Patient > 75%     Toileting Toileting    Toileting assist Assist for toileting: Supervision/Verbal cueing     Transfers Chair/bed transfer  Transfers assist  Chair/bed transfer activity did not occur: (did not occur.)  Chair/bed transfer assist level: Contact Guard/Touching assist     Locomotion Ambulation   Ambulation assist      Assist level: Contact Guard/Touching assist Assistive device: Walker-rolling Max distance: 150   Walk 10 feet activity   Assist     Assist level: Contact Guard/Touching assist Assistive device: Walker-rolling   Walk 50 feet activity   Assist Walk 50 feet with 2 turns activity did not occur: Safety/medical concerns  Assist level: Contact Guard/Touching assist Assistive device: Walker-rolling    Walk 150 feet activity   Assist Walk 150 feet activity did not  occur: Safety/medical concerns  Assist level: Contact Guard/Touching assist Assistive device: Walker-rolling    Walk 10 feet on uneven surface  activity   Assist Walk 10 feet on uneven surfaces activity did not occur: Safety/medical concerns          Wheelchair     Assist Will patient use wheelchair at discharge?: No Type of Wheelchair: Manual    Wheelchair assist level: Dependent - Patient 0% Max wheelchair distance: 25    Wheelchair 50 feet with 2 turns activity    Assist    Wheelchair 50 feet with 2 turns activity did not occur: Safety/medical concerns       Wheelchair 150 feet activity     Assist Wheelchair 150 feet activity did not occur: Safety/medical concerns          Medical Problem List and Plan: 1.  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 Status post left frontotemporal parietal craniotomy evacuation of subdural hematoma 03/10/2018  -Continue CIR therapies including PT, OT, and SLP .  -making continued progress 2.  Antithrombotics: -DVT/anticoagulation:  SCDs. No anticoagulation due to subdural hematoma             -antiplatelet therapy: Not applicable 3. Pain Management:  Hydrocodone as needed 4. Mood:  Zoloft 100 mg daily, Restoril 15 mg daily at bedtime             -antipsychotic agents: not applicable   -mood improving 5. Neuropsych: This patient is not capable of making decisions on his own behalf. 6. Skin/Wound Care:  Routine skin checks 7. Fluids/Electrolytes/Nutrition:  BMP within acceptable range on 3/9.  Good p.o. intake at present.    -recheck labs next week 8. Seizure disorder. Keppra 1500 mg twice a day, Vimpat 200 mg twice a day, valproic acid 500 mg every 8 hours.   -depakote level therapeutic  -Plan to continue this regimen to follow up outpatient with neurology services. 9. New-onset atrial fibrillation with RVR. Cardizem initiated 60 mg every 6 hours 03/16/2018. Cardiac rate control. No anticoagulation due to subdural hematoma. Follow-up cardiology services Dr. Garnette Scheuermann  -HR controlled on 3/12 10. Dysphagia. Dysphagia #3 thin liquids.   Advance diet per SLP, intake improving 11. Constipation: Bowel movement on 3/ 9  -changed regimen  to senkot-s 2 tabs at hs 13.  Hypoalbuminemia  Supplement initiated on 2/25, eating well 14.  Anemia with macrocytosis  Hemoglobin 11.8 on 03/24/2018, 10.8   3/9  Vit B12 WNL  Folate normal on 03/24/2018  LOS: 17 days A FACE TO FACE EVALUATION WAS PERFORMED  Ranelle Oyster 04/06/2018, 10:34 AM

## 2018-04-06 NOTE — Progress Notes (Signed)
Pt ambulated to BR. Medicated for generalized ache with tylenol. Pt and pt's wife stated that pt will try not to drink a lot of water at night and after dinner to reduce frequency of urination. Pt in bed with call light in reach. Wife remains with pt.

## 2018-04-07 ENCOUNTER — Inpatient Hospital Stay (HOSPITAL_COMMUNITY): Payer: Medicare Other | Admitting: Physical Therapy

## 2018-04-07 ENCOUNTER — Inpatient Hospital Stay (HOSPITAL_COMMUNITY): Payer: Medicare Other

## 2018-04-07 ENCOUNTER — Inpatient Hospital Stay (HOSPITAL_COMMUNITY): Payer: Medicare Other | Admitting: Speech Pathology

## 2018-04-07 MED ORDER — LORATADINE 10 MG PO TABS
10.0000 mg | ORAL_TABLET | Freq: Every day | ORAL | Status: DC
  Administered 2018-04-07 – 2018-04-11 (×5): 10 mg via ORAL
  Filled 2018-04-07 (×5): qty 1

## 2018-04-07 NOTE — Progress Notes (Signed)
Speech Language Pathology Daily Session Note  Patient Details  Name: Kito Lacross MRN: [id number] Date of Birth: 05-10-1935  Today's Date: 04/07/2018 SLP Individual Time: 1400-1455 SLP Individual Time Calculation (min): 55 min  Short Term Goals: Week 3: SLP Short Term Goal 1 (Week 3): STGs=LTGs  Skilled Therapeutic Interventions: Skilled treatment session focused on cognitive goals. SLP facilitated session by providing total A for recall of his current medications and their functions as well as Max A multimodal for problem solving and organization of a QD pill box. Patients function also impacted by decreased mental flexibility throughout task. Patient left upright on commode with RN present. Continue with current plan of care.      Pain No/Denies Pain   Therapy/Group: Individual Therapy  Hargis Vandyne 04/07/2018, 3:19 PM

## 2018-04-07 NOTE — Progress Notes (Signed)
Occupational Therapy Session Note  Patient Details  Name: Alvin Hudson MRN: [id number] Date of Birth: [birth year]  Today's Date: 04/07/2018 OT Individual Time: 1330-1400 OT Individual Time Calculation (min): 30 min    Short Term Goals: Week 1:  OT Short Term Goal 1 (Week 1): Pt will complete toilet transfer with min A OT Short Term Goal 1 - Progress (Week 1): Not met OT Short Term Goal 2 (Week 1): Pt will complete LB dressing with mod A OT Short Term Goal 2 - Progress (Week 1): Met OT Short Term Goal 3 (Week 1): Pt will tolerate standing for 3 mins in preparation for BADL tasks OT Short Term Goal 3 - Progress (Week 1): Not met  Skilled Therapeutic Interventions/Progress Updates:    1;1. No pain reported. Pt received in w/c with wife present. Pt requesting to shower. Pt ambulates with RW to transfer onto toilet to void bladder and undress with CGA-S. Pt transfers onto shower chair with S using grab bars at ambulatory level and bathes with intermittant CGA for standing balance d/t posterior lean. Pt requires VC for sitting during bathing all body parts except buttocks and peri area. Pt completes dressing sit to stand on commode with close A for LB/UB dressing. Grooming at sink in stanidng with set up and and direct handoff to SLP for next session.  Therapy Documentation Precautions:  Precautions Precautions: Fall Restrictions Weight Bearing Restrictions: No General: ]  Therapy/Group: Individual Therapy  Tonny Branch 04/07/2018, 2:16 PM

## 2018-04-07 NOTE — Progress Notes (Signed)
Physical Therapy Session Note  Patient Details  Name: Alvin Hudson MRN: [id number] Date of Birth: [birth year]  Today's Date: 04/07/2018 PT Individual Time: 0830-0927 PT Individual Time Calculation (min): 57 min   Short Term Goals: Week 3:  PT Short Term Goal 1 (Week 3): = LTG  Skilled Therapeutic Interventions/Progress Updates:    pt performs gait throughout unit with RW and supervision.  Pt c/o back pain keeping him from sleeping last night.  PT performed passive stretches and instructed pt in active stretches he could perform to relieve back pain, pt able to demo understanding.  Standing balance task with cone tapping with pt requiring mod A for balance, max cues for coordination with alternating LEs and following 2 step commands during this task.  Pt performs step ups to 4'' step with alternating LEs with mod/max cues, mod A for balance.  Side and backward stepping for balance with min A.  Standing with narrow BOS with shoulder flexion, chest press and shoulder horiz abd/add with 2# dowel with min A for balance.  Pt left in room with heat applied to back, alarm set, needs at hand  Therapy Documentation Precautions:  Precautions Precautions: Fall Restrictions Weight Bearing Restrictions: No Pain:  pt c/o mid back pain during session, stretches performed, heat applied after session   Therapy/Group: Individual Therapy  Jahdai Padovano 04/07/2018, 9:29 AM

## 2018-04-07 NOTE — Progress Notes (Signed)
PHYSICAL MEDICINE & REHABILITATION PROGRESS NOTE  Subjective/Complaints: Up at sink. Slept well. Mild back pain which norco helped. Ready for therapy   ROS: Patient denies fever, rash, sore throat, blurred vision, nausea, vomiting, diarrhea, cough, shortness of breath or chest pain,   headache, or mood change.   Objective: Vital Signs: Blood pressure 130/78, pulse 65, temperature 97.7 F (36.5 C), temperature source Oral, resp. rate 18, height 5\' 11"  (1.803 m), weight 67.9 kg, SpO2 100 %. No results found. No results for input(s): WBC, HGB, HCT, PLT in the last 72 hours. No results for input(s): NA, K, CL, CO2, GLUCOSE, BUN, CREATININE, CALCIUM in the last 72 hours.  Physical Exam: BP 130/78 (BP Location: Left Arm)   Pulse 65   Temp 97.7 F (36.5 C) (Oral)   Resp 18   Ht 5\' 11"  (1.803 m)   Wt 67.9 kg   SpO2 100%   BMI 20.88 kg/m  Constitutional: No distress . Vital signs reviewed. HEENT: EOMI, oral membranes moist Neck: supple Cardiovascular: RRR without murmur. No JVD    Respiratory: CTA Bilaterally without wheezes or rales. Normal effort    GI: BS +, non-tender, non-distended  Musc: No edema or tenderness in extremities. Neurological: alert Language fluency continues to improve, speaking in longer sentences and phrases Motor: Grossly 4+/5 throughout, more automatic with RUE.   Skin: See above  Psychiatric:pleasant  Assessment/Plan: 1. Functional deficits secondary to left SDH which require 3+ hours per day of interdisciplinary therapy in a comprehensive inpatient rehab setting.  Physiatrist is providing close team supervision and 24 hour management of active medical problems listed below.  Physiatrist and rehab team continue to assess barriers to discharge/monitor patient progress toward functional and medical goals  Care Tool:  Bathing  Bathing activity did not occur: (did not occur.) Body parts bathed by patient: Right arm, Chest, Abdomen, Front  perineal area, Buttocks, Right upper leg, Left upper leg, Face, Left arm, Right lower leg, Left lower leg   Body parts bathed by helper: Left lower leg     Bathing assist Assist Level: Supervision/Verbal cueing     Upper Body Dressing/Undressing Upper body dressing Upper body dressing/undressing activity did not occur (including orthotics): (did not occur; pt was already in bed.) What is the patient wearing?: Pull over shirt    Upper body assist Assist Level: Supervision/Verbal cueing    Lower Body Dressing/Undressing Lower body dressing    Lower body dressing activity did not occur: (did not occur.) What is the patient wearing?: Incontinence brief, Pants     Lower body assist Assist for lower body dressing: Minimal Assistance - Patient > 75%     Toileting Toileting    Toileting assist Assist for toileting: Supervision/Verbal cueing     Transfers Chair/bed transfer  Transfers assist  Chair/bed transfer activity did not occur: (did not occur.)  Chair/bed transfer assist level: Contact Guard/Touching assist     Locomotion Ambulation   Ambulation assist      Assist level: Contact Guard/Touching assist Assistive device: Walker-rolling Max distance: 150   Walk 10 feet activity   Assist     Assist level: Contact Guard/Touching assist Assistive device: Walker-rolling   Walk 50 feet activity   Assist Walk 50 feet with 2 turns activity did not occur: Safety/medical concerns  Assist level: Contact Guard/Touching assist Assistive device: Walker-rolling    Walk 150 feet activity   Assist Walk 150 feet activity did not occur: Safety/medical concerns  Assist level: Contact Guard/Touching assist  Assistive device: Walker-rolling    Walk 10 feet on uneven surface  activity   Assist Walk 10 feet on uneven surfaces activity did not occur: Safety/medical concerns         Wheelchair     Assist Will patient use wheelchair at discharge?: No Type  of Wheelchair: Manual    Wheelchair assist level: Dependent - Patient 0% Max wheelchair distance: 25    Wheelchair 50 feet with 2 turns activity    Assist    Wheelchair 50 feet with 2 turns activity did not occur: Safety/medical concerns       Wheelchair 150 feet activity     Assist Wheelchair 150 feet activity did not occur: Safety/medical concerns          Medical Problem List and Plan: 1.  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 Status post left frontotemporal parietal craniotomy evacuation of subdural hematoma 03/10/2018  -Continue CIR therapies including PT, OT, and SLP .  -making continued progress  -pt asked if he can transfer to bathroom with wife---asked him to check with PT 2.  Antithrombotics: -DVT/anticoagulation:  SCDs. No anticoagulation due to subdural hematoma             -antiplatelet therapy: Not applicable 3. Pain Management:  Hydrocodone as needed 4. Mood:  Zoloft 100 mg daily, Restoril 15 mg daily at bedtime             -antipsychotic agents: not applicable   -mood improving 5. Neuropsych: This patient is not capable of making decisions on his own behalf. 6. Skin/Wound Care:  Routine skin checks 7. Fluids/Electrolytes/Nutrition:  BMP within acceptable range on 3/9.  Good p.o. intake at present.    -recheck labs next week 8. Seizure disorder. Keppra 1500 mg twice a day, Vimpat 200 mg twice a day, valproic acid 500 mg every 8 hours.   -depakote level therapeutic  -Plan to continue this regimen to follow up outpatient with neurology services. 9. New-onset atrial fibrillation with RVR. Cardizem initiated 60 mg every 6 hours 03/16/2018. Cardiac rate control. No anticoagulation due to subdural hematoma. Follow-up cardiology services Dr. Garnette Scheuermann  -HR controlled on 3/12 10. Dysphagia. Dysphagia #3 thin liquids.   Advance diet per SLP, intake improving 11. Constipation: Bowel movement on 3/12  -continue senkot-s  2 tabs at hs 13.  Hypoalbuminemia  Supplement initiated on 2/25, eating well 14.  Anemia with macrocytosis  Hemoglobin 11.8 on 03/24/2018, 10.8   3/9  Vit B12 WNL  Folate normal on 03/24/2018  LOS: 18 days A FACE TO FACE EVALUATION WAS PERFORMED  Ranelle Oyster 04/07/2018, 9:02 AM

## 2018-04-07 NOTE — Progress Notes (Signed)
Pt slept well after receiving Norco for back pain. No further complaints. Resting with call light in reach.

## 2018-04-07 NOTE — Progress Notes (Signed)
Occupational Therapy Session Note  Patient Details  Name: Alvin Hudson MRN: [id number] Date of Birth: 08-08-1935  Today's Date: 04/07/2018 OT Group Time: 1100-1200 OT Group Time Calculation (min): 60 min    Short Term Goals: Week 1:  OT Short Term Goal 1 (Week 1): Pt will complete toilet transfer with min A OT Short Term Goal 1 - Progress (Week 1): Not met OT Short Term Goal 2 (Week 1): Pt will complete LB dressing with mod A OT Short Term Goal 2 - Progress (Week 1): Met OT Short Term Goal 3 (Week 1): Pt will tolerate standing for 3 mins in preparation for BADL tasks OT Short Term Goal 3 - Progress (Week 1): Not met  Skilled Therapeutic Interventions/Progress Updates:    no pain reported. Pt participates in skilled therapeutic tx group of making a "no sew" blanket  to be donated to the local animal shelter focusing on alternating  attention, BUE use, social participation, static standing with no UE support and fine motor coordination. Pt uses BUE to fold and measure lines, mark fabric with marker, cut strips of cloth with scissors, and tie knots on blanket. Pt stands for ~2 min with CGA for static and dynamic standing balance reaching for needed materials. Pt conversational throughout with other group members. Exited session with pt escorted back to room and set up with lunch.   Therapy Documentation Precautions:  Precautions Precautions: Fall Restrictions Weight Bearing Restrictions: No General:     Therapy/Group: Group Therapy  Tonny Branch 04/07/2018, 12:18 PM

## 2018-04-07 NOTE — Patient Care Conference (Signed)
Inpatient RehabilitationTeam Conference and Plan of Care Update Date: 04/04/2018   Time: 2:35 PM    Patient Name: Alvin Hudson      Medical Record Number: [id number]  Date of Birth: 1935/04/06 Sex: Male         Room/Bed: 4W05C/4W05C-01 Payor Info: Payor: MEDICARE / Plan: MEDICARE PART A AND B / Product Type: *No Product type* /    Admitting Diagnosis: SDH  Admit Date/Time:  03/20/2018  3:51 PM Admission Comments: No comment available   Primary Diagnosis:  <principal problem not specified> Principal Problem: <principal problem not specified>  Patient Active Problem List   Diagnosis Date Noted  . Anemia of chronic disease   . Megaloblastic anemia   . Hypoalbuminemia due to protein-calorie malnutrition (HCC)   . Leukocytosis   . New onset atrial fibrillation (HCC)   . Seizures (HCC)   . 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 History of subdural hematoma   . Dysphagia   . Atrial fibrillation (HCC) 03/17/2018  . Aphasia 03/12/2018  . Seizure (HCC) 03/12/2018  . Subdural hematoma (HCC) 03/10/2018    Expected Discharge Date: Expected Discharge Date: 04/11/18  Team Members Present: Physician leading conference: Dr. Faith Rogue Social Worker Present: Amada Jupiter, LCSW Nurse Present: Vincente Poli, RN PT Present: Judieth Keens, PT OT Present: Roney Mans, OT SLP Present: Feliberto Gottron, SLP     Current Status/Progress Goal Weekly Team Focus  Medical   Patient with improved word finding and language.  Mood is more upbeat overall but needs reinforcement.  Nutrition improved  Continue to maximize functional ability and safety  See medical problem list and above   Bowel/Bladder   Continent of B/B occasional nighttime incontinence or urine LBM 03/09  remain continent with normal bowel pattern  assess and assist prn laxatives prn   Swallow/Nutrition/ Hydration   Dys. 3 textures with thin liquids, Supervision  Supervision  trials of regular textures    ADL's   CGA  - min A overall, improving balance, postural control, cognition  Supervision  ADL training, cognitive activities, R FMC, motor planning, pt education   Mobility   supervision gait and transfers  supervision overall  family ed, d/c planning, activity tolerance, balance   Communication   Min A  Min A  self-monitoring and correcting errors    Safety/Cognition/ Behavioral Observations  Min A   Min A  attention, awareness, problem solving    Pain   pt has occasional general ache in nighttime relieved with tylenol  remain pain free  assess qshift and prn   Skin   surgical incision healed skin intact  remain free of breakdown  assess skin qshift and prn      *See Care Plan and progress notes for long and short-term goals.     Barriers to Discharge  Current Status/Progress Possible Resolutions Date Resolved   Physician    Medical stability        Continue medical management as outlined in medical chart      Nursing                  PT                    OT                  SLP                SW  Discharge Planning/Teaching Needs:  Plan for pt to d/c home with wife to provide 24/7 supervision.  Teaching is ongoing with wife who is staying at the hospital   Team Discussion:  Making excellent gains with language and cognition.  Hope to upgrade to regular diet soon.  Supervision to CGA with rw but needs a lot of rest breaks.  Overall improving well.  Revisions to Treatment Plan:  NA    Continued Need for Acute Rehabilitation Level of Care: The patient requires daily medical management by a physician with specialized training in physical medicine and rehabilitation for the following conditions: Daily direction of a multidisciplinary physical rehabilitation program to ensure safe treatment while eliciting the highest outcome that is of practical value to the patient.: Yes Daily medical management of patient stability for increased activity during participation in an  intensive rehabilitation regime.: Yes Daily analysis of laboratory values and/or radiology reports with any subsequent need for medication adjustment of medical intervention for : Post surgical problems;Neurological problems   I attest that I was present, lead the team conference, and concur with the assessment and plan of the team.   Delona Clasby 04/06/2018, 10:43 AM

## 2018-04-07 NOTE — Progress Notes (Signed)
Social Work Patient ID: Alvin Hudson, male   DOB: 02/13/1935, 83 y.o.   MRN: [id number]  Met with pt and spouse following team conference.  Both aware that team continues to plan toward 3/17 discharge and supervision goals.  Both very pleased with progress!  Continue to follow.  Stanford Strauch, LCSW

## 2018-04-08 DIAGNOSIS — S065X1S Traumatic subdural hemorrhage with loss of consciousness of 30 minutes or less, sequela: Secondary | ICD-10-CM

## 2018-04-08 NOTE — Progress Notes (Signed)
[facility] PHYSICAL MEDICINE & REHABILITATION PROGRESS NOTE  Subjective/Complaints: Slept well. No new issues  ROS: Patient denies fever, rash, sore throat, blurred vision, nausea, vomiting, diarrhea, cough, shortness of breath or chest pain, joint or back pain, headache, or mood change.   Objective: Vital Signs: Blood pressure 112/76, pulse (!) 54, temperature 97.7 F (36.5 C), temperature source Oral, resp. rate 16, height 5\' 11"  (1.803 m), weight 67.7 kg, SpO2 98 %. No results found. No results for input(s): WBC, HGB, HCT, PLT in the last 72 hours. No results for input(s): NA, K, CL, CO2, GLUCOSE, BUN, CREATININE, CALCIUM in the last 72 hours.  Physical Exam: BP 112/76 (BP Location: Left Arm)   Pulse (!) 54   Temp 97.7 F (36.5 C) (Oral)   Resp 16   Ht 5\' 11"  (1.803 m)   Wt 67.7 kg   SpO2 98%   BMI 20.82 kg/m  Constitutional: No distress . Vital signs reviewed. HEENT: EOMI, oral membranes moist Neck: supple Cardiovascular: RRR without murmur. No JVD    Respiratory: CTA Bilaterally without wheezes or rales. Normal effort    GI: BS +, non-tender, non-distended  Musc: No edema or tenderness in extremities. Neurological: alert Improving language Motor: Grossly 4+/5 throughout, more automatic with RUE.   Skin: See above  Psychiatric:pleasant  Assessment/Plan: 1. Functional deficits secondary to left SDH which require 3+ hours per day of interdisciplinary therapy in a comprehensive inpatient rehab setting.  Physiatrist is providing close team supervision and 24 hour management of active medical problems listed below.  Physiatrist and rehab team continue to assess barriers to discharge/monitor patient progress toward functional and medical goals  Care Tool:  Bathing  Bathing activity did not occur: (did not occur.) Body parts bathed by patient: Right arm, Chest, Abdomen, Front perineal area, Buttocks, Right upper leg, Left upper leg, Face, Left arm, Right lower leg,  Left lower leg   Body parts bathed by helper: Left lower leg     Bathing assist Assist Level: Supervision/Verbal cueing     Upper Body Dressing/Undressing Upper body dressing Upper body dressing/undressing activity did not occur (including orthotics): (did not occur; pt was already in bed.) What is the patient wearing?: Pull over shirt    Upper body assist Assist Level: Supervision/Verbal cueing    Lower Body Dressing/Undressing Lower body dressing    Lower body dressing activity did not occur: (did not occur.) What is the patient wearing?: Incontinence brief, Pants     Lower body assist Assist for lower body dressing: Minimal Assistance - Patient > 75%     Toileting Toileting    Toileting assist Assist for toileting: Supervision/Verbal cueing     Transfers Chair/bed transfer  Transfers assist  Chair/bed transfer activity did not occur: (did not occur.)  Chair/bed transfer assist level: Contact Guard/Touching assist     Locomotion Ambulation   Ambulation assist      Assist level: Contact Guard/Touching assist Assistive device: Walker-rolling Max distance: 150   Walk 10 feet activity   Assist     Assist level: Contact Guard/Touching assist Assistive device: Walker-rolling   Walk 50 feet activity   Assist Walk 50 feet with 2 turns activity did not occur: Safety/medical concerns  Assist level: Contact Guard/Touching assist Assistive device: Walker-rolling    Walk 150 feet activity   Assist Walk 150 feet activity did not occur: Safety/medical concerns  Assist level: Contact Guard/Touching assist Assistive device: Walker-rolling    Walk 10 feet on uneven surface  activity  Assist Walk 10 feet on uneven surfaces activity did not occur: Safety/medical concerns         Wheelchair     Assist Will patient use wheelchair at discharge?: No Type of Wheelchair: Manual    Wheelchair assist level: Dependent - Patient 0% Max wheelchair  distance: 25    Wheelchair 50 feet with 2 turns activity    Assist    Wheelchair 50 feet with 2 turns activity did not occur: Safety/medical concerns       Wheelchair 150 feet activity     Assist Wheelchair 150 feet activity did not occur: Safety/medical concerns          Medical Problem List and Plan: 1.  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 Status post left frontotemporal parietal craniotomy evacuation of subdural hematoma 03/10/2018  -Continue CIR therapies including PT, OT, and SLP .  -making continued progress    2.  Antithrombotics: -DVT/anticoagulation:  SCDs. No anticoagulation due to subdural hematoma             -antiplatelet therapy: Not applicable 3. Pain Management:  Hydrocodone as needed 4. Mood:  Zoloft 100 mg daily, Restoril 15 mg daily at bedtime             -antipsychotic agents: not applicable   -mood improving 5. Neuropsych: This patient is not capable of making decisions on his own behalf. 6. Skin/Wound Care:  Routine skin checks 7. Fluids/Electrolytes/Nutrition:  BMP within acceptable range on 3/9.  Good p.o. intake at present.    -recheck labs next week 8. Seizure disorder. Keppra 1500 mg twice a day, Vimpat 200 mg twice a day, valproic acid 500 mg every 8 hours.   -depakote level therapeutic  -Plan to continue this regimen to follow up outpatient with neurology services. 9. New-onset atrial fibrillation with RVR. Cardizem initiated 60 mg every 6 hours 03/16/2018. Cardiac rate control. No anticoagulation due to subdural hematoma. Follow-up cardiology services Dr. Garnette Scheuermann  -HR controlled on 3/14 10. Dysphagia. Dysphagia #3 thin liquids.   Advance diet per SLP, intake improving 11. Constipation: Bowel movement on 3/12  -continue senkot-s 2 tabs at hs 13.  Hypoalbuminemia  Supplement initiated on 2/25, eating well 14.  Anemia with macrocytosis  Hemoglobin 11.8 on 03/24/2018, 10.8   3/9  Vit B12  WNL  Folate normal on 03/24/2018  LOS: 19 days A FACE TO FACE EVALUATION WAS PERFORMED  Ranelle Oyster 04/08/2018, 10:45 AM

## 2018-04-09 NOTE — Progress Notes (Signed)
[facility] PHYSICAL MEDICINE & REHABILITATION PROGRESS NOTE  Subjective/Complaints: Up to bathroom to urinate at night, wakes him up. Doesn't want to do anything about it as it's not to different from his patterns at home  ROS: Patient denies fever, rash, sore throat, blurred vision, nausea, vomiting, diarrhea, cough, shortness of breath or chest pain, joint or back pain, headache, or mood change.    Objective: Vital Signs: Blood pressure 113/83, pulse (!) 53, temperature 98.1 F (36.7 C), resp. rate 16, height 5\' 11"  (1.803 m), weight 67.5 kg, SpO2 96 %. No results found. No results for input(s): WBC, HGB, HCT, PLT in the last 72 hours. No results for input(s): NA, K, CL, CO2, GLUCOSE, BUN, CREATININE, CALCIUM in the last 72 hours.  Physical Exam: BP 113/83 (BP Location: Left Arm)   Pulse (!) 53   Temp 98.1 F (36.7 C)   Resp 16   Ht 5\' 11"  (1.803 m)   Wt 67.5 kg   SpO2 96%   BMI 20.75 kg/m  Constitutional: No distress . Vital signs reviewed. HEENT: EOMI, oral membranes moist Neck: supple Cardiovascular: RRR without murmur. No JVD    Respiratory: CTA Bilaterally without wheezes or rales. Normal effort    GI: BS +, non-tender, non-distended  Musc: No edema or tenderness in extremities. Neurological: alert Improving language Motor: Grossly 4+/5 throughout, more automatic with RUE.   Skin: See above  Psychiatric:pleasant  Assessment/Plan: 1. Functional deficits secondary to left SDH which require 3+ hours per day of interdisciplinary therapy in a comprehensive inpatient rehab setting.  Physiatrist is providing close team supervision and 24 hour management of active medical problems listed below.  Physiatrist and rehab team continue to assess barriers to discharge/monitor patient progress toward functional and medical goals  Care Tool:  Bathing  Bathing activity did not occur: (did not occur.) Body parts bathed by patient: Right arm, Chest, Abdomen, Front perineal  area, Buttocks, Right upper leg, Left upper leg, Face, Left arm, Right lower leg, Left lower leg   Body parts bathed by helper: Left lower leg     Bathing assist Assist Level: Supervision/Verbal cueing     Upper Body Dressing/Undressing Upper body dressing Upper body dressing/undressing activity did not occur (including orthotics): (did not occur; pt was already in bed.) What is the patient wearing?: Pull over shirt    Upper body assist Assist Level: Supervision/Verbal cueing    Lower Body Dressing/Undressing Lower body dressing    Lower body dressing activity did not occur: (did not occur.) What is the patient wearing?: Incontinence brief, Pants     Lower body assist Assist for lower body dressing: Minimal Assistance - Patient > 75%     Toileting Toileting    Toileting assist Assist for toileting: Supervision/Verbal cueing     Transfers Chair/bed transfer  Transfers assist  Chair/bed transfer activity did not occur: (did not occur.)  Chair/bed transfer assist level: Contact Guard/Touching assist     Locomotion Ambulation   Ambulation assist      Assist level: Contact Guard/Touching assist Assistive device: Walker-rolling Max distance: 150   Walk 10 feet activity   Assist     Assist level: Contact Guard/Touching assist Assistive device: Walker-rolling   Walk 50 feet activity   Assist Walk 50 feet with 2 turns activity did not occur: Safety/medical concerns  Assist level: Contact Guard/Touching assist Assistive device: Walker-rolling    Walk 150 feet activity   Assist Walk 150 feet activity did not occur: Safety/medical concerns  Assist  level: Contact Guard/Touching assist Assistive device: Walker-rolling    Walk 10 feet on uneven surface  activity   Assist Walk 10 feet on uneven surfaces activity did not occur: Safety/medical concerns         Wheelchair     Assist Will patient use wheelchair at discharge?: No Type of  Wheelchair: Manual    Wheelchair assist level: Dependent - Patient 0% Max wheelchair distance: 25    Wheelchair 50 feet with 2 turns activity    Assist    Wheelchair 50 feet with 2 turns activity did not occur: Safety/medical concerns       Wheelchair 150 feet activity     Assist Wheelchair 150 feet activity did not occur: Safety/medical concerns          Medical Problem List and Plan: 1.  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 Status post left frontotemporal parietal craniotomy evacuation of subdural hematoma 03/10/2018  -Continue CIR therapies including PT, OT, and SLP .  -making continued progress    2.  Antithrombotics: -DVT/anticoagulation:  SCDs. No anticoagulation due to subdural hematoma             -antiplatelet therapy: Not applicable 3. Pain Management:  Hydrocodone as needed 4. Mood:  Zoloft 100 mg daily, Restoril 15 mg daily at bedtime             -antipsychotic agents: not applicable   -mood improving 5. Neuropsych: This patient is not capable of making decisions on his own behalf. 6. Skin/Wound Care:  Routine skin checks 7. Fluids/Electrolytes/Nutrition:  BMP within acceptable range on 3/9.  Good p.o. intake at present.    -recheck labs next week 8. Seizure disorder. Keppra 1500 mg twice a day, Vimpat 200 mg twice a day, valproic acid 500 mg every 8 hours.   -depakote level therapeutic  -Plan to continue this regimen to follow up outpatient with neurology services. 9. New-onset atrial fibrillation with RVR. Cardizem initiated 60 mg every 6 hours 03/16/2018. Cardiac rate control. No anticoagulation due to subdural hematoma. Follow-up cardiology services Dr. Garnette Scheuermann  -HR controlled on 3/15 10. Dysphagia. Dysphagia #3 thin liquids.   Advance diet per SLP, intake improving 11. Constipation: Bowel movement on 3/12  -continue senkot-s 2 tabs at hs 13.  Hypoalbuminemia  Supplement initiated on 2/25, eating  well 14.  Anemia with macrocytosis  Hemoglobin 11.8 on 03/24/2018, 10.8   3/9  Vit B12 WNL  Folate normal on 03/24/2018  LOS: 20 days A FACE TO FACE EVALUATION WAS PERFORMED  Ranelle Oyster 04/09/2018, 9:12 AM

## 2018-04-10 ENCOUNTER — Inpatient Hospital Stay (HOSPITAL_COMMUNITY): Payer: Medicare Other | Admitting: Speech Pathology

## 2018-04-10 ENCOUNTER — Inpatient Hospital Stay (HOSPITAL_COMMUNITY): Payer: Medicare Other | Admitting: Physical Therapy

## 2018-04-10 ENCOUNTER — Inpatient Hospital Stay (HOSPITAL_COMMUNITY): Payer: Medicare Other | Admitting: Occupational Therapy

## 2018-04-10 LAB — BASIC METABOLIC PANEL
Anion gap: 7 (ref 5–15)
BUN: 17 mg/dL (ref 8–23)
CALCIUM: 9 mg/dL (ref 8.9–10.3)
CO2: 31 mmol/L (ref 22–32)
CREATININE: 0.91 mg/dL (ref 0.61–1.24)
Chloride: 101 mmol/L (ref 98–111)
GFR calc Af Amer: >60 mL/min (ref >60)
GFR calc non Af Amer: >60 mL/min (ref >60)
Glucose, Bld: 99 mg/dL (ref 70–99)
Potassium: 4.5 mmol/L (ref 3.5–5.1)
Sodium: 139 mmol/L (ref 135–145)

## 2018-04-10 LAB — CBC
HCT: 35.9 % — ABNORMAL LOW (ref 39.0–52.0)
Hemoglobin: 11.7 g/dL — ABNORMAL LOW (ref 13.0–17.0)
MCH: 36.2 pg — ABNORMAL HIGH (ref 26.0–34.0)
MCHC: 32.6 g/dL (ref 30.0–36.0)
MCV: 111.1 fL — ABNORMAL HIGH (ref 80.0–100.0)
Platelets: 142 10*3/uL — ABNORMAL LOW (ref 150–400)
RBC: 3.23 MIL/uL — ABNORMAL LOW (ref 4.22–5.81)
RDW: 15.4 % (ref 11.5–15.5)
WBC: 6.5 10*3/uL (ref 4.0–10.5)
nRBC: 0 % (ref 0.0–0.2)

## 2018-04-10 MED ORDER — DILTIAZEM HCL ER COATED BEADS 240 MG PO CP24
240.0000 mg | ORAL_CAPSULE | Freq: Every day | ORAL | Status: DC
  Administered 2018-04-11: 240 mg via ORAL
  Filled 2018-04-10: qty 1

## 2018-04-10 NOTE — Discharge Summary (Signed)
Physician Discharge Summary  Patient ID: Alvin Hudson MRN: [id number] DOB/AGE: [birth year] 83 y.o.  Admit date: 03/20/2018 Discharge date: 04/11/2018  Discharge Diagnoses:  Active Problems:   Traumatic subdural hematoma (HCC)   Anemia of chronic disease   Megaloblastic anemia   Hypoalbuminemia due to protein-calorie malnutrition (HCC)   Leukocytosis   New onset atrial fibrillation (HCC)   Seizures (HCC) Mood Dysphagia Constipation  Discharged Condition: stable and discharged to home  Significant Diagnostic Studies: Ct Angio Head W Or Wo Contrast  Result Date: 03/18/2018 CLINICAL DATA:  Follow up stroke. Status post subdural hematoma evacuation March 10, 2018. History of seizures and atrial fibrillation. EXAM: CT ANGIOGRAPHY HEAD AND NECK TECHNIQUE: Multidetector CT imaging of the head and neck was performed using the standard protocol during bolus administration of intravenous contrast. Multiplanar CT image reconstructions and MIPs were obtained to evaluate the vascular anatomy. Carotid stenosis measurements (when applicable) are obtained utilizing NASCET criteria, using the distal internal carotid diameter as the denominator. CONTRAST:  ISOVUE-370 IOPAMIDOL (ISOVUE-370) INJECTION 76% COMPARISON:  MRI/MRA head March 13, 2018 and CT HEAD March 10, 2018. FINDINGS: CT HEAD FINDINGS BRAIN: Status post evacuation of LEFT subdural hematoma with 6 mm residual low-density LEFT frontal component. Trace LEFT falcotentorial subdural hematoma. 5 mm low-density LEFT parafalcine fluid collection. 2 mm LEFT-to-RIGHT midline shift, improved. No intraparenchymal hemorrhage. No parenchymal brain volume loss for age. Faint supratentorial white matter hypodensities compatible with chronic small vessel ischemic changes, less than expected for age. LEFT extra-axial pneumocephalus and Surgicel. Basal cisterns are patent. VASCULAR: Mild calcific atherosclerosis of the carotid siphons. SKULL: New LEFT  frontal craniotomy. Postoperative LEFT scalp soft tissue swelling with skin staples and minimal subcutaneous gas. SINUSES/ORBITS: Mild paranasal sinus mucosal thickening. Nasogastric tube via RIGHT nares. Mastoid air cells are well aerated.The included ocular globes and orbital contents are non-suspicious. OTHER: None. CTA NECK FINDINGS: AORTIC ARCH: Normal appearance of the thoracic arch, normal branch pattern. Mild calcific atherosclerosis aortic arch. The origins of the innominate, left Common carotid artery and subclavian artery are patent. RIGHT CAROTID SYSTEM: Common carotid artery is patent. Normal appearance of the carotid bifurcation without hemodynamically significant stenosis by NASCET criteria. Normal appearance of the internal carotid artery. LEFT CAROTID SYSTEM: Common carotid artery is patent. Trace calcific atherosclerosis of the carotid bifurcation without hemodynamically significant stenosis by NASCET criteria. Fusiform dolichoectasia LEFT cervical internal carotid artery to 6 mm. VERTEBRAL ARTERIES:Nearly codominant vertebral arteries. Normal appearance of the vertebral arteries, widely patent. SKELETON: No acute osseous process though bone windows have not been submitted. Severe C3-4 through C6-7 spondylosis. Severe RIGHT C3-4, bilateral C4-5, LEFT C5-6 neural foraminal narrowing. Moderate canal stenosis C3-4, C4-5, C5-6. OTHER NECK: Soft tissues of the neck are nonacute though, not tailored for evaluation. UPPER CHEST: Moderate RIGHT and small LEFT pleural effusions. Mosaic attenuation suggesting pulmonary edema. Nasogastric tube in place. CTA HEAD FINDINGS: ANTERIOR CIRCULATION: Patent cervical internal carotid arteries, petrous, cavernous and supra clinoid internal carotid arteries. Patent anterior communicating artery. Patent anterior and middle cerebral arteries. Mild stenosis LEFT M 1 segment. No large vessel occlusion, flow-limiting stenosis, contrast extravasation or aneurysm. POSTERIOR  CIRCULATION: Patent vertebral arteries, vertebrobasilar junction and basilar artery, as well as main branch vessels. Patent posterior cerebral arteries. No large vessel occlusion, flow-limiting stenosis, contrast extravasation or aneurysm. VENOUS SINUSES: Major dural venous sinuses are patent though not tailored for evaluation on this angiographic examination. ANATOMIC VARIANTS: None. DELAYED PHASE: No abnormal parenchymal enhancement. MIP images reviewed. IMPRESSION: CT HEAD: 1. Status post  craniotomy for evacuation of subdural hematoma with 5 mm residual low-density component. 5 mm LEFT parafalcine hygroma and trace falcotentorial subdural hematoma. 2 mm residual LEFT-to-RIGHT midline shift. CTA NECK: 1. No hemodynamically significant stenosis ICA's. Patent vertebral arteries. 2. Dolichoectatic LEFT cervical ICA seen with atherosclerosis, hypertension or old intimal injury. 3. New moderate RIGHT small LEFT pleural effusions with findings of pulmonary edema. Recommend follow-up chest radiograph. 4. Moderate canal stenosis C3-4 through C5-6. Severe C3-4 through C5-6 neural foraminal narrowing. CTA HEAD: 1. No emergent large vessel occlusion or flow-limiting stenosis. 2. Mild stenosis LEFT M 1 segment. Aortic Atherosclerosis (ICD10-I70.0). Electronically Signed   By: Awilda Metro M.D.   On: 03/18/2018 13:27   Dg Chest 2 View  Result Date: 03/19/2018 CLINICAL DATA:  Pleural effusion EXAM: CHEST - 2 VIEW COMPARISON:  03/13/2018 FINDINGS: Feeding tube has been placed with its tip beyond the fundus of the stomach. Small bilateral pleural effusions of the velum with bibasilar atelectasis. No pneumothorax. Cardiomegaly. Normal vascularity. IMPRESSION: Small bilateral pleural effusions and bibasilar atelectasis. Electronically Signed   By: Jolaine Click M.D.   On: 03/19/2018 14:33   Ct Head Wo Contrast  Result Date: 03/12/2018 CLINICAL DATA:  Possible stroke, right-sided weakness with incomprehensible speech.  History of seizures. EXAM: CT HEAD WITHOUT CONTRAST TECHNIQUE: Contiguous axial images were obtained from the base of the skull through the vertex without intravenous contrast. COMPARISON:  03/10/2018 FINDINGS: Brain: Redistribution of postop blood products over the left frontal and parietal convexities current exam. There is redemonstration of a small amount postoperative air overlying the left cerebral convexity. No significant change in the appearance of brain is otherwise noted. No significant mass effect or midline shift. Chronic small vessel ischemic disease of periventricular white matter. No herniation. Midline ventricle basal cisterns without effacement. Brainstem and cerebellum are nonacute. Vascular: Atherosclerosis of the carotid siphons. No hyperdense vessel sign. Skull: Postop change from left lateral craniotomy with overlying scalp soft tissue emphysema and residual blood products with overlying skin staples. Sinuses/Orbits: Intact orbits and globes. Clear paranasal sinuses. Other: Left periorbital and malar soft tissue swelling. IMPRESSION: 1. Redistribution of postop blood products over the left frontal and parietal convexities on current exam. No significant mass effect or midline shift. No acute intracranial appearing abnormality. 2. Postop change from left lateral craniotomy with overlying scalp soft tissue emphysema and residual blood products. 3. Left periorbital and malar soft tissue swelling. Electronically Signed   By: Tollie Eth M.D.   On: 03/12/2018 20:52   Ct Angio Neck W Or Wo Contrast  Result Date: 03/18/2018 CLINICAL DATA:  Follow up stroke. Status post subdural hematoma evacuation March 10, 2018. History of seizures and atrial fibrillation. EXAM: CT ANGIOGRAPHY HEAD AND NECK TECHNIQUE: Multidetector CT imaging of the head and neck was performed using the standard protocol during bolus administration of intravenous contrast. Multiplanar CT image reconstructions and MIPs were  obtained to evaluate the vascular anatomy. Carotid stenosis measurements (when applicable) are obtained utilizing NASCET criteria, using the distal internal carotid diameter as the denominator. CONTRAST:  ISOVUE-370 IOPAMIDOL (ISOVUE-370) INJECTION 76% COMPARISON:  MRI/MRA head March 13, 2018 and CT HEAD March 10, 2018. FINDINGS: CT HEAD FINDINGS BRAIN: Status post evacuation of LEFT subdural hematoma with 6 mm residual low-density LEFT frontal component. Trace LEFT falcotentorial subdural hematoma. 5 mm low-density LEFT parafalcine fluid collection. 2 mm LEFT-to-RIGHT midline shift, improved. No intraparenchymal hemorrhage. No parenchymal brain volume loss for age. Faint supratentorial white matter hypodensities compatible with chronic small vessel  ischemic changes, less than expected for age. LEFT extra-axial pneumocephalus and Surgicel. Basal cisterns are patent. VASCULAR: Mild calcific atherosclerosis of the carotid siphons. SKULL: New LEFT frontal craniotomy. Postoperative LEFT scalp soft tissue swelling with skin staples and minimal subcutaneous gas. SINUSES/ORBITS: Mild paranasal sinus mucosal thickening. Nasogastric tube via RIGHT nares. Mastoid air cells are well aerated.The included ocular globes and orbital contents are non-suspicious. OTHER: None. CTA NECK FINDINGS: AORTIC ARCH: Normal appearance of the thoracic arch, normal branch pattern. Mild calcific atherosclerosis aortic arch. The origins of the innominate, left Common carotid artery and subclavian artery are patent. RIGHT CAROTID SYSTEM: Common carotid artery is patent. Normal appearance of the carotid bifurcation without hemodynamically significant stenosis by NASCET criteria. Normal appearance of the internal carotid artery. LEFT CAROTID SYSTEM: Common carotid artery is patent. Trace calcific atherosclerosis of the carotid bifurcation without hemodynamically significant stenosis by NASCET criteria. Fusiform dolichoectasia LEFT  cervical internal carotid artery to 6 mm. VERTEBRAL ARTERIES:Nearly codominant vertebral arteries. Normal appearance of the vertebral arteries, widely patent. SKELETON: No acute osseous process though bone windows have not been submitted. Severe C3-4 through C6-7 spondylosis. Severe RIGHT C3-4, bilateral C4-5, LEFT C5-6 neural foraminal narrowing. Moderate canal stenosis C3-4, C4-5, C5-6. OTHER NECK: Soft tissues of the neck are nonacute though, not tailored for evaluation. UPPER CHEST: Moderate RIGHT and small LEFT pleural effusions. Mosaic attenuation suggesting pulmonary edema. Nasogastric tube in place. CTA HEAD FINDINGS: ANTERIOR CIRCULATION: Patent cervical internal carotid arteries, petrous, cavernous and supra clinoid internal carotid arteries. Patent anterior communicating artery. Patent anterior and middle cerebral arteries. Mild stenosis LEFT M 1 segment. No large vessel occlusion, flow-limiting stenosis, contrast extravasation or aneurysm. POSTERIOR CIRCULATION: Patent vertebral arteries, vertebrobasilar junction and basilar artery, as well as main branch vessels. Patent posterior cerebral arteries. No large vessel occlusion, flow-limiting stenosis, contrast extravasation or aneurysm. VENOUS SINUSES: Major dural venous sinuses are patent though not tailored for evaluation on this angiographic examination. ANATOMIC VARIANTS: None. DELAYED PHASE: No abnormal parenchymal enhancement. MIP images reviewed. IMPRESSION: CT HEAD: 1. Status post craniotomy for evacuation of subdural hematoma with 5 mm residual low-density component. 5 mm LEFT parafalcine hygroma and trace falcotentorial subdural hematoma. 2 mm residual LEFT-to-RIGHT midline shift. CTA NECK: 1. No hemodynamically significant stenosis ICA's. Patent vertebral arteries. 2. Dolichoectatic LEFT cervical ICA seen with atherosclerosis, hypertension or old intimal injury. 3. New moderate RIGHT small LEFT pleural effusions with findings of pulmonary edema.  Recommend follow-up chest radiograph. 4. Moderate canal stenosis C3-4 through C5-6. Severe C3-4 through C5-6 neural foraminal narrowing. CTA HEAD: 1. No emergent large vessel occlusion or flow-limiting stenosis. 2. Mild stenosis LEFT M 1 segment. Aortic Atherosclerosis (ICD10-I70.0). Electronically Signed   By: Awilda Metro M.D.   On: 03/18/2018 13:27   Mr Maxine Glenn Head Wo Contrast  Result Date: 03/13/2018 CLINICAL DATA:  Altered mental status EXAM: MRI HEAD WITHOUT CONTRAST MRA HEAD WITHOUT CONTRAST TECHNIQUE: Multiplanar, multiecho pulse sequences of the brain and surrounding structures were obtained without intravenous contrast. Angiographic images of the head were obtained using MRA technique without contrast. COMPARISON:  Head CT 03/12/2018 FINDINGS: MRI HEAD FINDINGS BRAIN: There are extra-axial blood products over the left convexity. Multiple tiny foci of cortical diffusion weighted abnormality likely due to the presence of nearby blood. No acute infarct is identified. The left convexity collection measures up to 7 mm in thickness. The midline structures are normal. There are no old infarcts. The white matter signal is normal for the patient's age. Generalized atrophy without lobar predilection. Susceptibility-sensitive  sequences show no chronic microhemorrhage or superficial siderosis. SKULL AND UPPER CERVICAL SPINE: Status post left pterional craniotomy. SINUSES/ORBITS: No fluid levels or advanced mucosal thickening. No mastoid or middle ear effusion. The orbits are normal. MRA HEAD FINDINGS POSTERIOR CIRCULATION: --Basilar artery: Normal. --Posterior cerebral arteries: Normal. Both originate from the basilar artery. --Superior cerebellar arteries: Normal. --Inferior cerebellar arteries: Normal anterior and posterior inferior cerebellar arteries. ANTERIOR CIRCULATION: --Intracranial internal carotid arteries: Normal. --Anterior cerebral arteries: Normal. Both A1 segments are present. Patent anterior  communicating artery. --Middle cerebral arteries: Multifocal moderate-to-severe atherosclerotic narrowing of the M2 branches. Unremarkable right MCA. --Posterior communicating arteries: Absent bilaterally. IMPRESSION: 1. 7 mm extra-axial hematoma over the left convexity, as previously demonstrated on earlier head CT. No new site of hemorrhage. No acute ischemia. 2. 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 No intracranial large vessel occlusion. Electronically Signed   By: Deatra Robinson M.D.   On: 03/13/2018 17:48   Mr Laqueta Jean ON Contrast  Result Date: 03/13/2018 CLINICAL DATA:  Altered mental status EXAM: MRI HEAD WITHOUT CONTRAST MRA HEAD WITHOUT CONTRAST TECHNIQUE: Multiplanar, multiecho pulse sequences of the brain and surrounding structures were obtained without intravenous contrast. Angiographic images of the head were obtained using MRA technique without contrast. COMPARISON:  Head CT 03/12/2018 FINDINGS: MRI HEAD FINDINGS BRAIN: There are extra-axial blood products over the left convexity. Multiple tiny foci of cortical diffusion weighted abnormality likely due to the presence of nearby blood. No acute infarct is identified. The left convexity collection measures up to 7 mm in thickness. The midline structures are normal. There are no old infarcts. The white matter signal is normal for the patient's age. Generalized atrophy without lobar predilection. Susceptibility-sensitive sequences show no chronic microhemorrhage or superficial siderosis. SKULL AND UPPER CERVICAL SPINE: Status post left pterional craniotomy. SINUSES/ORBITS: No fluid levels or advanced mucosal thickening. No mastoid or middle ear effusion. The orbits are normal. MRA HEAD FINDINGS POSTERIOR CIRCULATION: --Basilar artery: Normal. --Posterior cerebral arteries: Normal. Both originate from the basilar artery. --Superior cerebellar arteries: Normal. --Inferior cerebellar arteries: Normal  anterior and posterior inferior cerebellar arteries. ANTERIOR CIRCULATION: --Intracranial internal carotid arteries: Normal. --Anterior cerebral arteries: Normal. Both A1 segments are present. Patent anterior communicating artery. --Middle cerebral arteries: Multifocal moderate-to-severe atherosclerotic narrowing of the M2 branches. Unremarkable right MCA. --Posterior communicating arteries: Absent bilaterally. IMPRESSION: 1. 7 mm extra-axial hematoma over the left convexity, as previously demonstrated on earlier head CT. No new site of hemorrhage. No acute ischemia. 2. 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 No intracranial large vessel occlusion. Electronically Signed   By: Deatra Robinson M.D.   On: 03/13/2018 17:48   Dg Chest Port 1 View  Result Date: 03/14/2018 CLINICAL DATA:  83 year old male with fever for 1 day. Altered mental status. EXAM: PORTABLE CHEST 1 VIEW COMPARISON:  No prior chest imaging. FINDINGS: Portable AP supine view at 2129 hours. Cardiomegaly. Tortuous thoracic aorta. Other mediastinal contours are within normal limits. Visualized tracheal air column is within normal limits. Allowing for portable technique the lungs are clear. No pneumothorax or pleural effusion is evident. No acute osseous abnormality identified. IMPRESSION: Cardiomegaly. No acute cardiopulmonary abnormality. Electronically Signed   By: Odessa Fleming M.D.   On: 03/14/2018 00:55   Dg Swallowing Func-speech Pathology  Result Date: 03/20/2018 Objective Swallowing Evaluation: Type of Study: MBS-Modified Barium Swallow Study  Patient Details Name: Haris Baack MRN: [id number] Date of Birth: 07-22-1935 Today's Date: 03/20/2018 Time: SLP Start Time (  ACUTE ONLY): 1000 -SLP Stop Time (ACUTE ONLY): 1020 SLP Time Calculation (min) (ACUTE ONLY): 20 min Past Medical History: Past Medical History: Diagnosis Date . Allergy  . Seizures (HCC)  Past Surgical History: Past Surgical History: Procedure  Laterality Date . CRANIOTOMY Left 03/10/2018  Procedure: CRANIOTOMY HEMATOMA EVACUATION SUBDURAL;  Surgeon: Tressie Stalker, MD;  Location: [facility] OR;  Service: Neurosurgery;  Laterality: Left; . INGUINAL HERNIA REPAIR Bilateral  HPI: The patient is an 83 year old white male who fell, head CT negative in ED and did not seen to be admitted. Developed difficulty moving right side and aphasia 2/14, came to ED, CT revealed large acute left subdural hematoma and underwent craniotomy same day. On 2/16 pt requested to go home and pt deemed stable and was disxcharged only to return to ED later that day due to inability to speak after waking from nap. CXR Cardiomegaly. No acute cardiopulmonary abnormality.  No data recorded Assessment / Plan / Recommendation CHL IP CLINICAL IMPRESSIONS 03/20/2018 Clinical Impression Pt demonstrated mild oropharyngeal dysphagia marked by transient and flash laryngeal penetration. Anterior bolus loss on right due to decreased containment in addition to episode of penetration close to cords before full engagement of swallow that was ejected during the swallow. Flash penetration with larger consecutive sips thin (cup and straw) observed not posing great aspiration risk during this study. During majority of swallows epiglottis was able to invert past Cortrak tube however tube appeared to prevent full deflection leaving mild-moderate vallecular residue with regular and intermittently with thin. 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 Thin liquids with cups (avoid straws for first 2-3 days), pills whole in applesauce, full supervision and check right side for pocketed food.     SLP Visit Diagnosis Dysphagia, oropharyngeal phase (R13.12) Attention and concentration deficit following -- Frontal lobe and executive function deficit following -- Impact on safety and function Mild aspiration  risk;Moderate aspiration risk   CHL IP TREATMENT RECOMMENDATION 03/20/2018 Treatment Recommendations Therapy as outlined in treatment plan below   Prognosis 03/20/2018 Prognosis for Safe Diet Advancement Good Barriers to Reach Goals -- Barriers/Prognosis Comment -- CHL IP DIET RECOMMENDATION 03/20/2018 SLP Diet Recommendations Dysphagia 2 (Fine chop) solids;Thin liquid Liquid Administration via Cup;No straw Medication Administration Whole meds with puree Compensations Minimize environmental distractions;Slow rate;Small sips/bites;Multiple dry swallows after each bite/sip Postural Changes Seated upright at 90 degrees   CHL IP OTHER RECOMMENDATIONS 03/20/2018 Recommended Consults -- Oral Care Recommendations Oral care BID Other Recommendations --   CHL IP FOLLOW UP RECOMMENDATIONS 03/20/2018 Follow up Recommendations Inpatient Rehab   CHL IP FREQUENCY AND DURATION 03/20/2018 Speech Therapy Frequency (ACUTE ONLY) min 2x/week Treatment Duration 2 weeks      CHL IP ORAL PHASE 03/20/2018 Oral Phase Impaired Oral - Pudding Teaspoon -- Oral - Pudding Cup -- Oral - Honey Teaspoon -- Oral - Honey Cup WFL Oral - Nectar Teaspoon WFL Oral - Nectar Cup WFL Oral - Nectar Straw -- Oral - Thin Teaspoon Weak lingual manipulation;WFL Oral - Thin Cup Right anterior bolus loss Oral - Thin Straw WFL Oral - Puree WFL Oral - Mech Soft -- Oral - Regular Reduced posterior propulsion Oral - Multi-Consistency -- Oral - Pill -- Oral Phase - Comment --  CHL IP PHARYNGEAL PHASE 03/20/2018 Pharyngeal Phase Impaired Pharyngeal- Pudding Teaspoon -- Pharyngeal -- Pharyngeal- Pudding Cup -- Pharyngeal -- Pharyngeal- Honey Teaspoon -- Pharyngeal -- Pharyngeal- Honey Cup Pharyngeal residue - valleculae Pharyngeal --  Pharyngeal- Nectar Teaspoon Pharyngeal residue - valleculae Pharyngeal -- Pharyngeal- Nectar Cup Penetration/Aspiration during swallow;Lateral channel residue Pharyngeal Material enters airway, remains ABOVE vocal cords then ejected out Pharyngeal-  Nectar Straw -- Pharyngeal -- Pharyngeal- Thin Teaspoon Pharyngeal residue - valleculae;Pharyngeal residue - pyriform Pharyngeal Material does not enter airway Pharyngeal- Thin Cup Penetration/Aspiration during swallow Pharyngeal Material enters airway, CONTACTS cords and then ejected out Pharyngeal- Thin Straw Pharyngeal residue - valleculae Pharyngeal Material does not enter airway Pharyngeal- Puree Pharyngeal residue - valleculae Pharyngeal -- Pharyngeal- Mechanical Soft -- Pharyngeal -- Pharyngeal- Regular Pharyngeal residue - valleculae Pharyngeal -- Pharyngeal- Multi-consistency -- Pharyngeal -- Pharyngeal- Pill -- Pharyngeal -- Pharyngeal Comment --  CHL IP CERVICAL ESOPHAGEAL PHASE 03/20/2018 Cervical Esophageal Phase Impaired Pudding Teaspoon -- Pudding Cup -- Honey Teaspoon -- Honey Cup -- Nectar Teaspoon -- Nectar Cup -- Nectar Straw -- Thin Teaspoon -- Thin Cup -- Thin Straw -- Puree -- Mechanical Soft -- Regular -- Multi-consistency -- Pill -- Cervical Esophageal Comment cricopharyngeal bar Royce MacadamiaLitaker, Lisa Willis 03/20/2018, 1:44 PM Breck CoonsLisa Willis Litaker M.Ed Sports administratorCCC-SLP Speech-Language Pathologist Pager [phone] Office [phone]               Labs:  Basic Metabolic Panel: Recent Labs  Lab 04/03/18 0559  NA 139  K 4.2  CL 101  CO2 28  GLUCOSE 101*  BUN 13  CREATININE 0.87  CALCIUM 8.9    CBC: Recent Labs  Lab 04/03/18 0559  WBC 6.9  HGB 10.8*  HCT 33.5*  MCV 110.6*  PLT 215    CBG: No results for input(s): GLUCAP in the last 168 hours.  Brief HPI:    Alvin BroodDon Hudson is an 83 year old right-handed male with history of seizure 2 years maintained on Keppra [facility]. Initially presented 03/10/2018 after recent fall initial cranial CT scan negative. He lives with spouse independent prior to initial admission 03/10/2018 and active. [facility] was [facility] obtained [facility] but they  cannot accommodate him because the ICU was full. [facility] and [facility] showed acute left subdural hematoma. [facility] and underwent left frontotemporal parietal craniotomy for evacuation of subdural hematoma 03/10/2018 per Dr. Lovell SheehanJenkins. Patient progressed nicely ambulating 500 feet without assistive device was discharged to home 03/12/2018 . Readmitted same day after family reports decrease in mobility as well as speech difficulty and dysphagia. Follow-up CT/MRI showed left frontal extra-axial hematoma. Per report a 7 mm extra-axial hematoma over the left convex knee as well as previously demonstrated on earlier CT scans same day no new site of hemorrhage no acute ischemia. 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 CT angiogram of head and neck no significant stenosis. Was an incidental note of moderate right small left pleural effusion with findings of pulmonary edema with follow-up chest x-ray showed small bilateral pleural effusions and bibasilar atelectasis. Noted seizure on 03/15/2018 . Follow-up EEG negative. Maintained on Keppra, valproic acid as well as Vimpat for seizure prophylaxis. New onset atrial fibrillation with RVR initially placed on Cardizem drip transition to Cardizem. Troponin mildly elevated felt to be related to demand ischemia. Echocardiogram with ejection fraction of 65% normal systolic function. Patient initially nothing by mouth with nasogastric tube placed for nutritional support diet advanced a dysphagia #2 thin liquid. Therapy evaluations  completed and patient was admitted for a comprehensive rehabilitation program.  Hospital Course: Alvin Hudson was admitted to rehab 03/20/2018 for inpatient therapies to consist of PT, ST and OT at least three hours five days a week. Past admission  physiatrist, therapy team and rehab RN have worked together to provide customized collaborative inpatient rehab. 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 He was participating with therapies. DVT prophylaxis with SCDs no anticoagulation due to subdural hematoma. Pain management with intermittent use of hydrocodone. Mood stabilization with Zoloft as well as Restoril mood greatly improved. He continued on Keppra, Vimpat as well as valproic acid for his seizure disorder no further seizure activity noted he would follow-up outpatient neurology services. New onset atrial fibrillation cardiac rate controlled Cardizem adjusted he would follow up with Dr. Daine Gip of cardiology services. His diet had been advanced to mechanical soft. Bouts of constipation resolved with laxative assistance. Anemia with macrocytosis hemoglobin 11.8 on 03/24/2018 folate normal on 03/24/2018.   Rehab course: During patient's stay in rehab weekly team conferences were held to monitor patient's progress, set goals and discuss barriers to discharge. At admission, patient required moderate assist ambulate 45 feet without assistive device, moderate assist sit to stand. Moderate assist upper body bathing max assist lower body bathing max assist upper body dressing and max assist lower body dressing. As patient continue with ongoing therapies at time of discharge performed ambulation throughout the unit with rolling walker and supervision. Performed passive stretches instructed patient in active stretches. Standing balance greatly improved perform step ups to a 4 inch step with alternating lower extremities and moderate to max cues as well as balance. Stance for 2 minutes and contact guard for static and dynamic standing balance reaching for needed materials. He could provide simple needs. Followed by speech therapy. Full  family teaching was completed and plan discharge to home  Physical exam. Blood pressure 123/95 pulse 73 temperature 97.9 respirations 19 Constitutional. Well-developed well-nourished Head. Normocephalic atraumatic craniotomy site clean and dry Neck supple nontender no JVD without bruit Cardiac rate irregular irregular Respiratory effort normal breath sounds normal no respiratory distress without wheeze GI soft bowel sounds normal nontender no rebound Musculoskeletal no edema or tenderness in extremities Neurological. He was alert expressive receptive aphasia moving bilateral upper extremity spontaneously limited exam due to aphasia and inability to participate. He/She  has had improvement in activity tolerance, balance, postural control as well as ability to compensate for deficits. He/She has had improvement in functional use RUE/LUE  and RLE/LLE as well as improvement in awareness       Disposition:  discharged to home   Diet: regular  Special Instructions:  no driving   Medications at time of discharge 1. Lipitor 20 mg daily 2. B complex with vitamin C one tablet daily 3. Cardizem 60 mg every 6 hours 4. Depakote 500 mg every 8 hours 5. Hydrocodone one tablet every 4 hours as needed pain 6. Vimpat 200 mg by mouth twice a day 7. Keppra 1500 mg by mouth twice a day 8. Claritin 10 mg by mouth daily 9. FiberCon 625 mg by mouth daily 10. MiraLAX daily hold for loose stool 11. Senokot-S 2 tablets bedtime 12,. Zoloft 100 mg by mouth daily 13. Restoril 50 mg by mouth daily at bedtime  Discharge Instructions    Ambulatory referral to Physical Medicine Rehab   Complete by:  As directed    Moderate complexity follow-up  1-2 weeks traumatic subdural hematoma      Follow-up Information    Ranelle Oyster, MD Follow up.   Specialty:  Physical Medicine and Rehabilitation Why:  office to call for appointment Contact information: [address] [place]  [address] [phone]        Tressie Stalker, MD Follow up.   Specialty:  Neurosurgery Why:  call for appointment 2 weeks Contact information: [address] [place] [address] [phone]        Jenita Seashore, MD Follow up.   Specialty:  Psychiatry Why:  call for appointment Contact information: [address] [facility] [place] [address] [phone]        Lyn Records, MD Follow up.   Specialty:  Cardiology Why:  call for appointment Contact information: [address] [place] [address] [phone]           Signed: Mcarthur Rossetti Naava Janeway 04/10/2018, 5:58 AM

## 2018-04-10 NOTE — Progress Notes (Signed)
Physical Therapy Discharge Summary  Patient Details  Name: Alvin Hudson MRN: [id number] Date of Birth: 12/22/1935  Today's Date: 04/10/2018 PT Individual Time: 0830-0926 PT Individual Time Calculation (min): 56 min   Pt able to perform gait in room and bathroom including all parts of toilet transfer and toileting with supervision.  Gait throughout unit with RW and supervision including ramp and curb negotiation.  Stair training 12 stairs x 2 with 1 handrail with supervision.  Berg balance test performed with pt scoring 34/56. Pt educated on high fall risk and recommendation to use RW at all times.  Pt performs floor transfer for fall recovery with supervision.  Pt and wife state they feel ready for d/c home tomorrow at this level of care.  Patient has met 8 of 8 long term goals due to improved activity tolerance, improved balance, improved postural control, increased strength, ability to compensate for deficits, functional use of  right upper extremity and right lower extremity, improved attention, improved awareness and improved coordination.  Patient to discharge at an ambulatory level Supervision.   Patient's care partner is independent to provide the necessary supervision assistance at discharge.  Reasons goals not met: n/a  Recommendation:  Patient will benefit from ongoing skilled PT services in home health setting to continue to advance safe functional mobility, address ongoing impairments in balance, gait, strength, and minimize fall risk.  Equipment: RW  Reasons for discharge: treatment goals met and discharge from hospital  Patient/family agrees with progress made and goals achieved: Yes  PT Discharge Precautions/Restrictions Precautions Precautions: Fall Pain Pain Assessment Pain Score: 0-No pain  Cognition Overall Cognitive Status: Impaired/Different from baseline Arousal/Alertness: Awake/alert Orientation Level: Oriented X4 Attention: Selective Sustained Attention:  Appears intact Selective Attention: Appears intact Memory: Impaired Memory Impairment: Decreased recall of new information;Retrieval deficit Awareness: Appears intact Awareness Impairment: Anticipatory impairment Problem Solving: Impaired Problem Solving Impairment: Functional complex Safety/Judgment: Impaired Rancho [employer] Scales of Cognitive Functioning: Purposeful/appropriate Sensation Sensation Light Touch Impaired Details: Impaired RLE;Impaired RUE Proprioception Impaired Details: Impaired RLE;Impaired RUE Coordination Gross Motor Movements are Fluid and Coordinated: Yes Fine Motor Movements are Fluid and Coordinated: No Coordination and Movement Description: improving coordination in Rt UE Motor  Motor Motor: Hemiplegia Motor - Discharge Observations: improving Rt hemiplegia  Mobility Bed Mobility Supine to Sit: Independent Transfers Stand Pivot Transfers: Supervision/Verbal cueing Locomotion  Gait Gait Assistance: Supervision/Verbal cueing Gait Distance (Feet): 150 Feet Assistive device: Rolling walker Stairs / Additional Locomotion Stairs: Yes Stairs Assistance: Supervision/Verbal cueing Stair Management Technique: One rail Left Number of Stairs: 12 Ramp: Supervision/Verbal cueing Curb: Supervision/Verbal cueing Wheelchair Mobility Wheelchair Mobility: No  Trunk/Postural Assessment  Cervical Assessment Cervical Assessment: (fwd head) Thoracic Assessment Thoracic Assessment: (mild kyphosis) Lumbar Assessment Lumbar Assessment: (posterior pelvic tilt) Postural Control Righting Reactions: improving but delayed  Balance Standardized Balance Assessment Standardized Balance Assessment: Berg Balance Test Berg Balance Test Sit to Stand: Able to stand without using hands and stabilize independently Standing Unsupported: Able to stand 2 minutes with supervision Sitting with Back Unsupported but Feet Supported on Floor or Stool: Able to sit safely and securely  2 minutes Stand to Sit: Sits safely with minimal use of hands Transfers: Able to transfer safely, definite need of hands Standing Unsupported with Eyes Closed: Able to stand 10 seconds with supervision Standing Ubsupported with Feet Together: Able to place feet together independently and stand for 1 minute with supervision From Standing, Reach Forward with Outstretched Arm: Reaches forward but needs supervision From Standing Position, Pick up Object  from Floor: Able to pick up shoe, needs supervision From Standing Position, Turn to Look Behind Over each Shoulder: Turn sideways only but maintains balance Turn 360 Degrees: Needs close supervision or verbal cueing Standing Unsupported, Alternately Place Feet on Step/Stool: Able to complete >2 steps/needs minimal assist Standing Unsupported, One Foot in Front: Able to take small step independently and hold 30 seconds Standing on One Leg: Unable to try or needs assist to prevent fall Total Score: 34 Extremity Assessment      RLE Assessment General Strength Comments: grossly 3/5 LLE Assessment General Strength Comments: grossly 4/5    , 04/10/2018, 9:13 AM

## 2018-04-10 NOTE — Progress Notes (Signed)
[facility] PHYSICAL MEDICINE & REHABILITATION PROGRESS NOTE  Subjective/Complaints: No new complaints. Up in chair. Excited about discharge. Slept well  ROS: Patient denies fever, rash, sore throat, blurred vision, nausea, vomiting, diarrhea, cough, shortness of breath or chest pain, joint or back pain, headache, or mood change.   Objective: Vital Signs: Blood pressure 116/72, pulse (!) 52, temperature 97.9 F (36.6 C), temperature source Oral, resp. rate 17, height 5\' 11"  (1.803 m), weight 67 kg, SpO2 96 %. No results found. Recent Labs    04/10/18 0641  WBC 6.5  HGB 11.7*  HCT 35.9*  PLT 142*   Recent Labs    04/10/18 0641  NA 139  K 4.5  CL 101  CO2 31  GLUCOSE 99  BUN 17  CREATININE 0.91  CALCIUM 9.0    Physical Exam: BP 116/72 (BP Location: Left Arm)   Pulse (!) 52   Temp 97.9 F (36.6 C) (Oral)   Resp 17   Ht 5\' 11"  (1.803 m)   Wt 67 kg   SpO2 96%   BMI 20.60 kg/m  Constitutional: No distress . Vital signs reviewed. HEENT: EOMI, oral membranes moist Neck: supple Cardiovascular: RRR without murmur. No JVD    Respiratory: CTA Bilaterally without wheezes or rales. Normal effort    GI: BS +, non-tender, non-distended  Musc: No edema or tenderness in extremities. Neurological: alert Improving fluency. Speaking in sentences with pauses/breaks Motor: Grossly 4+/5 throughout, more automatic with RUE.   Skin: See above  Psychiatric:very up beat  Assessment/Plan: 1. Functional deficits secondary to left SDH which require 3+ hours per day of interdisciplinary therapy in a comprehensive inpatient rehab setting.  Physiatrist is providing close team supervision and 24 hour management of active medical problems listed below.  Physiatrist and rehab team continue to assess barriers to discharge/monitor patient progress toward functional and medical goals  Care Tool:  Bathing  Bathing activity did not occur: (did not occur.) Body parts bathed by patient: Right  arm, Chest, Abdomen, Front perineal area, Buttocks, Right upper leg, Left upper leg, Face, Left arm, Right lower leg, Left lower leg   Body parts bathed by helper: Left lower leg     Bathing assist Assist Level: Supervision/Verbal cueing     Upper Body Dressing/Undressing Upper body dressing Upper body dressing/undressing activity did not occur (including orthotics): (did not occur; pt was already in bed.) What is the patient wearing?: Pull over shirt    Upper body assist Assist Level: Supervision/Verbal cueing    Lower Body Dressing/Undressing Lower body dressing    Lower body dressing activity did not occur: (did not occur.) What is the patient wearing?: Incontinence brief, Pants     Lower body assist Assist for lower body dressing: Minimal Assistance - Patient > 75%     Toileting Toileting    Toileting assist Assist for toileting: Supervision/Verbal cueing     Transfers Chair/bed transfer  Transfers assist  Chair/bed transfer activity did not occur: (did not occur.)  Chair/bed transfer assist level: Supervision/Verbal cueing     Locomotion Ambulation   Ambulation assist      Assist level: Supervision/Verbal cueing Assistive device: Walker-rolling Max distance: 150   Walk 10 feet activity   Assist     Assist level: Supervision/Verbal cueing Assistive device: Walker-rolling   Walk 50 feet activity   Assist Walk 50 feet with 2 turns activity did not occur: Safety/medical concerns  Assist level: Supervision/Verbal cueing Assistive device: Walker-rolling    Walk 150 feet activity  Assist Walk 150 feet activity did not occur: Safety/medical concerns  Assist level: Supervision/Verbal cueing Assistive device: Walker-rolling    Walk 10 feet on uneven surface  activity   Assist Walk 10 feet on uneven surfaces activity did not occur: Safety/medical concerns   Assist level: Supervision/Verbal cueing     Wheelchair     Assist Will  patient use wheelchair at discharge?: No Type of Wheelchair: Manual    Wheelchair assist level: Dependent - Patient 0% Max wheelchair distance: 25    Wheelchair 50 feet with 2 turns activity    Assist    Wheelchair 50 feet with 2 turns activity did not occur: Safety/medical concerns       Wheelchair 150 feet activity     Assist Wheelchair 150 feet activity did not occur: Safety/medical concerns          Medical Problem List and Plan: 1.  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 Status post left frontotemporal parietal craniotomy evacuation of subdural hematoma 03/10/2018  -Continue CIR therapies including PT, OT, and SLP .  -dc tomorrow  -Patient to see Rehab MD/provider in the office for transitional care encounter in 1-2 weeks.     2.  Antithrombotics: -DVT/anticoagulation:  SCDs. No anticoagulation due to subdural hematoma             -antiplatelet therapy: Not applicable 3. Pain Management:  Hydrocodone as needed 4. Mood:  Zoloft 100 mg daily, Restoril 15 mg daily at bedtime             -antipsychotic agents: not applicable   -mood improving 5. Neuropsych: This patient is not capable of making decisions on his own behalf. 6. Skin/Wound Care:  Routine skin checks 7. Fluids/Electrolytes/Nutrition:  -I personally reviewed the patient's labs today.  WNL 8. Seizure disorder. Keppra 1500 mg twice a day, Vimpat 200 mg twice a day, valproic acid 500 mg every 8 hours.   -depakote level recently therapeutic  -Plan to continue this regimen to follow up outpatient with neurology services. 9. New-onset atrial fibrillation with RVR. Cardizem initiated 60 mg every 6 hours 03/16/2018. Cardiac rate control. No anticoagulation due to subdural hematoma. Follow-up cardiology services Dr. Garnette Scheuermann  -HR controlled on 3/16 10. Dysphagia. Dysphagia #3 thin liquids.   Advance diet per SLP, intake improving 11. Constipation: Bowel movement on  3/12  -continue senkot-s 2 tabs at hs 13.  Hypoalbuminemia  Supplement initiated on 2/25, eating well 14.  Anemia with macrocytosis  Hemoglobin 11.8 on 03/24/2018===11.7 3/16  Vit B12 WNL  Folate normal on 03/24/2018  LOS: 21 days A FACE TO FACE EVALUATION WAS PERFORMED  Ranelle Oyster 04/10/2018, 9:22 AM

## 2018-04-10 NOTE — Progress Notes (Signed)
Occupational Therapy Discharge Summary  Patient Details  Name: Alvin Hudson MRN: [id number] Date of Birth: [birth year]  Today's Date: 04/10/2018  Session 1 OT Individual Time: 6073-7106 OT Individual Time Calculation (min): 70 min   Session 2 OT Individual Time: 2694-8546 OT Individual Time Calculation (min): 42 min   Session 1 Pt greeted semi-reclined in bed and requesting to shower. Pt ambulated in room without AD and CGA. Close supervision when ambulating with RW. Pt completed toilet and shower transfers with supervision. BADL tasks all completed with supervision. Pt ambulated to therapy apartment w. Gilda Crease and supervision. Practiced tub bench transfer with education provided to pt and spouse on safe BADL participation within home environment. Pt ambulated back to room and left semi-reclined in bed with needs met.   Session 2 Pt greeted standing at the sink washing hands after going to the bathroom with nursing. Pt had just received lunch tray. Pt able to self feed using R UE with improved control of feeding utensil. OT provided pt with home fine motor program and educated on exercises. OT also reviewed thera-putty exercises and provided pt with handout depicting them. Discussed any further questions or concerns regarding dc with spouse. Pt and family feel ready for dc. Pt left seated in wc with family present and needs met.   Patient has met 13 of 13 long term goals due to improved activity tolerance, improved balance, postural control, ability to compensate for deficits, functional use of  RIGHT upper and RIGHT lower extremity, improved attention, improved awareness and improved coordination.  Patient to discharge at overall Supervision level.  Patient's care partner is independent to provide the necessary physical and cognitive assistance at discharge.    Reasons goals not met: n/a  Recommendation:  Patient will benefit from ongoing skilled OT services in outpatient setting to continue to  advance functional skills in the area of BADL and functional use of R UE.  Equipment: RW, and tub transfer bench  Reasons for discharge: treatment goals met and discharge from hospital  Patient/family agrees with progress made and goals achieved: Yes  OT Discharge Precautions/Restrictions  Precautions Precautions: Fall Restrictions Weight Bearing Restrictions: No Pain  none/denies pain ADL ADL Eating: Supervision/safety Grooming: Supervision/safety Upper Body Bathing: Supervision/safety Lower Body Bathing: Supervision/safety Upper Body Dressing: Supervision/safety Lower Body Dressing: Supervision/safety Toileting: Supervision/safety Toilet Transfer: Close supervision Toilet Transfer Method: Ambulating Tub/Shower Transfer: Close supervison Tub/Shower Transfer Method: Ambulating Tub/Shower Equipment: Special educational needs teacher  Perception: Within Functional Limits Praxis Praxis: Intact Cognition Arousal/Alertness: Awake/alert Orientation Level: Oriented X4 Sensation Coordination Coordination and Movement Description: improved coordination in Rt UE Motor  Motor Motor: Hemiplegia Motor - Skilled Clinical Observations: Rt hemiplegia Motor - Discharge Observations: improved Rt hemiplegia Mobility  Bed Mobility Bed Mobility: Supine to Sit Supine to Sit: Independent  Balance Dynamic Sitting Balance Sitting balance - Comments: Independent Dynamic Standing Balance Dynamic Standing - Comments: close supervision Extremity/Trunk Assessment RUE Assessment RUE Assessment: Exceptions to [facility] General Strength Comments: 4+/5 RUE Body System: Neuro Brunstrum levels for arm and hand: Arm;Hand Brunstrum level for arm: Stage V Relative Independence from Synergy Brunstrum level for hand: Stage VI Isolated joint movements LUE Assessment LUE Assessment: Within Functional Limits   Daneen Schick Claris Pech 04/10/2018, 1:43 PM

## 2018-04-10 NOTE — Progress Notes (Signed)
Speech Language Pathology Discharge Summary  Patient Details  Name: Alvin Hudson MRN: [id number] Date of Birth: November 07, 1935  Today's Date: 04/10/2018 SLP Individual Time: 1400-1430 SLP Individual Time Calculation (min): 30 min   Skilled Therapeutic Interventions:  Skilled treatment session focused on completion of patient and family education with the patient's wife. SLP facilitated session by providing education in regards to patient's current cognitive functioning and strategies to utilize at home to maximize patient's overall safety, recall and independence. She verbalized understanding of all information. Patient requested to use the commode at end of session and required supervision verbal cues for safety with task due to impulsivity. Patient left supine in bed with alarm on and all needs within reach. Continue with current plan of care.   Patient has met 8 of 8 long term goals.  Patient to discharge at overall Supervision level.   Reasons goals not met: N/A   Clinical Impression/Discharge Summary: Patient has made excellent gains and has met 8 of 8 LTGs this admission. Currently, patient is consuming regular textures with thin liquids with minimal overt s/s of aspiration and overall Mod I for use of swallowing compensatory strategies. Patient demonstrates improved communication and can verbalize his wants/needs and participate in a functional conversation with extra time and overall supervision level vernal cues to self-monitor and correct errors. Patient also demonstrates improved cognitive functioning and demonstrates behaviors consistent with a Rancho Level VIII requiring overall supervision level verbal cues for recall, attention, awareness and basic problem solving. Patient and family education is complete and patient will discharge home with 24 hour supervision from family. Patient would benefit from f/u SLP services to maximize his cognitive-linguistic function and overall functional  independence in order to reduce caregiver burden.   Care Partner:  Caregiver Able to Provide Assistance: Yes  Type of Caregiver Assistance: Physical;Cognitive  Recommendation:  24 hour supervision/assistance;Outpatient SLP  Rationale for SLP Follow Up: Reduce caregiver burden;Maximize cognitive function and independence;Maximize functional communication   Equipment: N/A   Reasons for discharge: Treatment goals met;Discharged from hospital   Patient/Family Agrees with Progress Made and Goals Achieved: Yes    [place], [place] 04/10/2018, 6:37 AM

## 2018-04-10 NOTE — Progress Notes (Signed)
Patient swallowed pill with water with no trouble.

## 2018-04-11 MED ORDER — POLYETHYLENE GLYCOL 3350 17 G PO PACK: 17.0000 g | PACK | Freq: Every day | ORAL | 0 refills | Status: AC

## 2018-04-11 MED ORDER — LEVETIRACETAM 100 MG/ML PO SOLN: 1500.0000 mg | Freq: Two times a day (BID) | ORAL | 12 refills | Status: DC

## 2018-04-11 MED ORDER — ATORVASTATIN CALCIUM 20 MG PO TABS: 20.0000 mg | ORAL_TABLET | Freq: Every day | ORAL | 0 refills | Status: DC

## 2018-04-11 MED ORDER — DIVALPROEX SODIUM 500 MG PO DR TAB: 500.0000 mg | DELAYED_RELEASE_TABLET | Freq: Three times a day (TID) | ORAL | 0 refills | Status: AC

## 2018-04-11 MED ORDER — SERTRALINE HCL 100 MG PO TABS: 100.0000 mg | ORAL_TABLET | Freq: Every day | ORAL | 0 refills | Status: AC

## 2018-04-11 MED ORDER — LACOSAMIDE 200 MG PO TABS: 200.0000 mg | ORAL_TABLET | Freq: Two times a day (BID) | ORAL | 1 refills | Status: AC

## 2018-04-11 MED ORDER — SENNOSIDES-DOCUSATE SODIUM 8.6-50 MG PO TABS: 2.0000 | ORAL_TABLET | Freq: Every day | ORAL | Status: AC

## 2018-04-11 MED ORDER — DILTIAZEM HCL ER COATED BEADS 240 MG PO CP24: 240.0000 mg | ORAL_CAPSULE | Freq: Every day | ORAL | 1 refills | Status: AC

## 2018-04-11 MED ORDER — LORATADINE 10 MG PO TABS: 10.0000 mg | ORAL_TABLET | Freq: Every day | ORAL | 0 refills | Status: AC

## 2018-04-11 MED ORDER — HYDROCODONE-ACETAMINOPHEN 5-325 MG PO TABS: 1.0000 | ORAL_TABLET | ORAL | 0 refills | Status: DC | PRN

## 2018-04-11 MED ORDER — ACETAMINOPHEN 325 MG PO TABS: 650.0000 mg | ORAL_TABLET | ORAL | Status: AC | PRN

## 2018-04-11 NOTE — Progress Notes (Signed)
Pt DC summary complete by Jesusita Oka, [place]. No c/o pain noted. Belongings packed by wife. Pt and family reviewed meds. No further complaints noted. Will be transported to main lobby.   Ross Ludwig, LPN

## 2018-04-11 NOTE — Progress Notes (Signed)
[facility] PHYSICAL MEDICINE & REHABILITATION PROGRESS NOTE  Subjective/Complaints: Slept well. Making continued gains.   ROS: Patient denies fever, rash, sore throat, blurred vision, nausea, vomiting, diarrhea, cough, shortness of breath or chest pain, joint or back pain, headache, or mood change.    Objective: Vital Signs: Blood pressure 124/78, pulse (!) 54, temperature 97.7 F (36.5 C), temperature source Oral, resp. rate 17, height 5\' 11"  (1.803 m), weight 66.4 kg, SpO2 99 %. No results found. Recent Labs    04/10/18 0641  WBC 6.5  HGB 11.7*  HCT 35.9*  PLT 142*   Recent Labs    04/10/18 0641  NA 139  K 4.5  CL 101  CO2 31  GLUCOSE 99  BUN 17  CREATININE 0.91  CALCIUM 9.0    Physical Exam: BP 124/78 (BP Location: Left Arm)   Pulse (!) 54   Temp 97.7 F (36.5 C) (Oral)   Resp 17   Ht 5\' 11"  (1.803 m)   Wt 66.4 kg   SpO2 99%   BMI 20.42 kg/m  Constitutional: No distress . Vital signs reviewed. HEENT: EOMI, oral membranes moist Neck: supple Cardiovascular: RRR without murmur. No JVD    Respiratory: CTA Bilaterally without wheezes or rales. Normal effort    GI: BS +, non-tender, non-distended  Musc: No edema or tenderness in extremities. Neurological: alert Improving language fluency Motor: Grossly 4+/5 throughout, more automatic with RUE.   Skin: See above  Psychiatric:pleasant  Assessment/Plan: 1. Functional deficits secondary to left SDH which require 3+ hours per day of interdisciplinary therapy in a comprehensive inpatient rehab setting.  Physiatrist is providing close team supervision and 24 hour management of active medical problems listed below.  Physiatrist and rehab team continue to assess barriers to discharge/monitor patient progress toward functional and medical goals  Care Tool:  Bathing  Bathing activity did not occur: (did not occur.) Body parts bathed by patient: Right arm, Left arm, Abdomen, Front perineal area, Chest, Buttocks,  Left upper leg, Right upper leg, Right lower leg, Face, Left lower leg   Body parts bathed by helper: Left lower leg     Bathing assist Assist Level: Supervision/Verbal cueing     Upper Body Dressing/Undressing Upper body dressing Upper body dressing/undressing activity did not occur (including orthotics): (did not occur; pt was already in bed.) What is the patient wearing?: Pull over shirt    Upper body assist Assist Level: Supervision/Verbal cueing    Lower Body Dressing/Undressing Lower body dressing    Lower body dressing activity did not occur: (did not occur.) What is the patient wearing?: Incontinence brief, Pants     Lower body assist Assist for lower body dressing: Supervision/Verbal cueing     Toileting Toileting    Toileting assist Assist for toileting: Supervision/Verbal cueing     Transfers Chair/bed transfer  Transfers assist  Chair/bed transfer activity did not occur: (did not occur.)  Chair/bed transfer assist level: Supervision/Verbal cueing     Locomotion Ambulation   Ambulation assist      Assist level: Supervision/Verbal cueing Assistive device: Walker-rolling Max distance: 150   Walk 10 feet activity   Assist     Assist level: Supervision/Verbal cueing Assistive device: Walker-rolling   Walk 50 feet activity   Assist Walk 50 feet with 2 turns activity did not occur: Safety/medical concerns  Assist level: Supervision/Verbal cueing Assistive device: Walker-rolling    Walk 150 feet activity   Assist Walk 150 feet activity did not occur: Safety/medical concerns  Assist  level: Supervision/Verbal cueing Assistive device: Walker-rolling    Walk 10 feet on uneven surface  activity   Assist Walk 10 feet on uneven surfaces activity did not occur: Safety/medical concerns   Assist level: Supervision/Verbal cueing     Wheelchair     Assist Will patient use wheelchair at discharge?: No Type of Wheelchair: Manual     Wheelchair assist level: Dependent - Patient 0% Max wheelchair distance: 25    Wheelchair 50 feet with 2 turns activity    Assist    Wheelchair 50 feet with 2 turns activity did not occur: Safety/medical concerns       Wheelchair 150 feet activity     Assist Wheelchair 150 feet activity did not occur: Safety/medical concerns          Medical Problem List and Plan: 1.  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 Status post left frontotemporal parietal craniotomy evacuation of subdural hematoma 03/10/2018   -dc home today  -Patient to see Rehab MD/provider in the office for transitional care encounter in 1-2 weeks.    -neuro/pcp follow up 2.  Antithrombotics: -DVT/anticoagulation:  SCDs. No anticoagulation due to subdural hematoma             -antiplatelet therapy: Not applicable 3. Pain Management:  Hydrocodone as needed 4. Mood:  Zoloft 100 mg daily, Restoril 15 mg daily at bedtime             -antipsychotic agents: not applicable   -mood improved 5. Neuropsych: This patient is not capable of making decisions on his own behalf. 6. Skin/Wound Care:  Routine skin checks 7. Fluids/Electrolytes/Nutrition:  -I personally reviewed the patient's labs today.  WNL 8. Seizure disorder. Keppra 1500 mg twice a day, Vimpat 200 mg twice a day, valproic acid 500 mg every 8 hours.   -depakote level recently therapeutic  -Plan to continue this regimen to follow up outpatient with neurology services. 9. New-onset atrial fibrillation with RVR. Cardizem initiated 60 mg every 6 hours 03/16/2018. Cardiac rate control. No anticoagulation due to subdural hematoma. Follow-up cardiology services Dr. Garnette Scheuermann  -HR controlled on 3/17 10. Dysphagia. Diet advanced to regular/thins  11. Constipation: Bowel movement on 3/12  -continue senkot-s 2 tabs at hs 13.  Hypoalbuminemia  Supplement initiated on 2/25, eating well 14.  Anemia with  macrocytosis  Hemoglobin 11.8 on 03/24/2018===11.7 3/16  Vit B12 WNL  Folate normal on 03/24/2018  LOS: 22 days A FACE TO FACE EVALUATION WAS PERFORMED  Ranelle Oyster 04/11/2018, 8:47 AM

## 2018-04-11 NOTE — Progress Notes (Signed)
Patient took meds whole with water without diffuiculty. He got up unassisted to go to the bathroom without using the call bell. His wife was laying on the cot.Alarm was sounding. When staff got to the room, he was already by the sink. Will continue to monitor & encourage use of the call bell.

## 2018-04-11 NOTE — Plan of Care (Signed)
  Problem: RH BOWEL ELIMINATION Goal: RH STG MANAGE BOWEL WITH ASSISTANCE Description STG Manage Bowel with Assistance. Mod  Outcome: Completed/Met   Problem: RH BLADDER ELIMINATION Goal: RH STG MANAGE BLADDER WITH ASSISTANCE Description STG Manage Bladder With Assistance. Mod  Outcome: Completed/Met   Problem: RH SKIN INTEGRITY Goal: RH STG SKIN FREE OF INFECTION/BREAKDOWN Description Free of breakdown, infection while on rehab mod assist.   Outcome: Completed/Met   Problem: RH SAFETY Goal: RH STG ADHERE TO SAFETY PRECAUTIONS W/ASSISTANCE/DEVICE Description STG Adhere to Safety Precautions With Assistance/Device.Mod  Outcome: Completed/Met   Problem: RH COGNITION-NURSING Goal: RH STG USES MEMORY AIDS/STRATEGIES W/ASSIST TO PROBLEM SOLVE Description STG Uses Memory Aids/Strategies With Assistance to Problem Solve. Mod  Outcome: Completed/Met   Problem: RH KNOWLEDGE DEFICIT BRAIN INJURY Goal: RH STG INCREASE KNOWLEDGE OF SELF CARE AFTER BRAIN INJURY Description Patient and family will be able to describe care of patient following BI with cues, handouts  Outcome: Completed/Met   Problem: Consults Goal: RH BRAIN INJURY PATIENT EDUCATION Description Description: See Patient Education module for eduction specifics Outcome: Completed/Met

## 2018-04-11 NOTE — Discharge Instructions (Signed)
Inpatient Rehab Discharge Instructions  Alvin Hudson Discharge date and time: No discharge date for patient encounter.   Activities/Precautions/ Functional Status: Activity: activity as tolerated Diet:  Wound Care: keep wound clean and dry Functional status:  ___ No restrictions     ___ Walk up steps independently ___ 24/7 supervision/assistance   ___ Walk up steps with assistance ___ Intermittent supervision/assistance  ___ Bathe/dress independently ___ Walk with walker     _x__ Bathe/dress with assistance ___ Walk Independently    ___ Shower independently ___ Walk with assistance    ___ Shower with assistance ___ No alcohol     ___ Return to work/school ________   COMMUNITY REFERRALS UPON DISCHARGE:    Outpatient: PT     OT    ST                   Agency:  [facility] Phone: [phone]                Appointment Date/Time:  They will contact you directly with appointment information  Medical Equipment/Items Ordered:  Rolling walker, tub bench                                                      Agency/Supplier:  [facility] @ [phone]   GENERAL COMMUNITY RESOURCES FOR PATIENT/FAMILY:  Support Groups:  Brain Injury Support Groups (see flyer)       Special Instructions:  No driving  My questions have been answered and I understand these instructions. I will adhere to these goals and the provided educational materials after my discharge from the hospital.  Patient/Caregiver Signature _______________________________ Date __________  Clinician Signature _______________________________________ Date __________  Please bring this form and your medication list with you to all your follow-up doctor's appointments.

## 2018-04-11 NOTE — Progress Notes (Signed)
Social Work  Discharge Note  The overall goal for the admission was met for:   Discharge location: Yes - home with wife who can provide 24/7 supervision  Length of Stay: Yes - 21 days  Discharge activity level: Yes - supervision  Home/community participation: Yes  Services provided included: MD, RD, PT, OT, SLP, RN, TR, Pharmacy, Neuropsych and SW  Financial Services: Medicare and Private Insurance: [employer]  Follow-up services arranged: Outpatient: PT, OT, ST via [place], DME: rolling walker, tub bench via [facility] and Patient/Family has no preference for HH/DME agencies  Comments (or additional information):  Patient/Family verbalized understanding of follow-up arrangements: Yes  Individual responsible for coordination of the follow-up plan: pt/ spouse  Confirmed correct DME delivered: Shaun Runyon 04/11/2018    Sarahlynn Cisnero

## 2018-04-12 ENCOUNTER — Telehealth: Payer: Self-pay

## 2018-04-12 NOTE — Telephone Encounter (Signed)
Transitional Care call-wife Lyons    1. Are you/is patient experiencing any problems since coming home? No Are there any questions regarding any aspect of care? No 2. Are there any questions regarding medications administration/dosing? No Are meds being taken as prescribed? Yes Patient should review meds with caller to confirm 3. Have there been any falls? No 4. Has Home Health been to the house and/or have they contacted you? Outpatient  Rehab If not, have you tried to contact them? Can we help you contact them? 5. Are bowels and bladder emptying properly? Constipation Are there any unexpected incontinence issues? No If applicable, is patient following bowel/bladder programs? 6. Any fevers, problems with breathing, unexpected pain? A little shortness of breath yesterday but propped up and better 7. Are there any skin problems or new areas of breakdown? No 8. Has the patient/family member arranged specialty MD follow up (ie cardiology/neurology/renal/surgical/etc)?  Yes Can we help arrange? 9. Does the patient need any other services or support that we can help arrange? No 10. Are caregivers following through as expected in assisting the patient? Yes 11. Has the patient quit smoking, drinking alcohol, or using drugs as recommended? Yes  Appointment time 9:40am, arrive time 9:20am on 04/25/2018 with Dr. Riley Kill [phone]

## 2018-04-13 ENCOUNTER — Encounter: Payer: Self-pay | Admitting: Nurse Practitioner

## 2018-04-17 ENCOUNTER — Encounter: Payer: Medicare Other | Admitting: Occupational Therapy

## 2018-04-17 ENCOUNTER — Ambulatory Visit: Payer: Medicare Other

## 2018-04-18 ENCOUNTER — Telehealth: Payer: Self-pay | Admitting: Nurse Practitioner

## 2018-04-18 NOTE — Telephone Encounter (Signed)
Phone call with wife today. Patient has appointment on Monday March 30th with me.   Post craniotomy - had AF - managed with rate control only - no anticoagulation.   No symptoms at this time to suggest any unstable cardiac conditions. Based on discussion, with current pandemic situation, we will be postponing this appointment for Mr. Alvin Hudson for approximately 6 to 8 weeks (with me or with Dr. Katrinka Blazing). If symptoms change, patient's wife was instructed to contact our office.   Rosalio Macadamia, RN, ANP-C [facility] [address] [place], [place]  [address] [phone]

## 2018-04-19 ENCOUNTER — Emergency Department: Payer: Medicare Other

## 2018-04-19 ENCOUNTER — Other Ambulatory Visit: Payer: Self-pay

## 2018-04-19 ENCOUNTER — Encounter: Payer: Self-pay | Admitting: Intensive Care

## 2018-04-19 ENCOUNTER — Inpatient Hospital Stay: Payer: Medicare Other

## 2018-04-19 ENCOUNTER — Inpatient Hospital Stay
Admission: EM | Admit: 2018-04-19 | Discharge: 2018-04-22 | DRG: 065 | Disposition: A | Discharge status: Skilled Nursing Facility | Payer: Medicare Other | Attending: Internal Medicine | Admitting: Internal Medicine

## 2018-04-19 ENCOUNTER — Telehealth: Payer: Self-pay | Admitting: *Deleted

## 2018-04-19 DIAGNOSIS — I480 Paroxysmal atrial fibrillation: Secondary | ICD-10-CM | POA: Diagnosis present

## 2018-04-19 DIAGNOSIS — I69314 Frontal lobe and executive function deficit following cerebral infarction: Secondary | ICD-10-CM | POA: Diagnosis present

## 2018-04-19 DIAGNOSIS — I482 Chronic atrial fibrillation, unspecified: Secondary | ICD-10-CM | POA: Diagnosis present

## 2018-04-19 DIAGNOSIS — W19XXXS Unspecified fall, sequela: Secondary | ICD-10-CM | POA: Diagnosis present

## 2018-04-19 DIAGNOSIS — G40909 Epilepsy, unspecified, not intractable, without status epilepticus: Secondary | ICD-10-CM | POA: Diagnosis present

## 2018-04-19 DIAGNOSIS — R29701 NIHSS score 1: Secondary | ICD-10-CM | POA: Diagnosis present

## 2018-04-19 DIAGNOSIS — R4701 Aphasia: Secondary | ICD-10-CM | POA: Diagnosis present

## 2018-04-19 DIAGNOSIS — G8321 Monoplegia of upper limb affecting right dominant side: Secondary | ICD-10-CM | POA: Diagnosis present

## 2018-04-19 DIAGNOSIS — I639 Cerebral infarction, unspecified: Secondary | ICD-10-CM | POA: Diagnosis present

## 2018-04-19 DIAGNOSIS — I878 Other specified disorders of veins: Secondary | ICD-10-CM | POA: Diagnosis present

## 2018-04-19 DIAGNOSIS — R9431 Abnormal electrocardiogram [ECG] [EKG]: Secondary | ICD-10-CM | POA: Diagnosis not present

## 2018-04-19 DIAGNOSIS — S065X9S Traumatic subdural hemorrhage with loss of consciousness of unspecified duration, sequela: Secondary | ICD-10-CM | POA: Diagnosis not present

## 2018-04-19 LAB — COMPREHENSIVE METABOLIC PANEL
ALT: 21 U/L (ref 0–44)
AST: 31 U/L (ref 15–41)
Albumin: 3.4 g/dL — ABNORMAL LOW (ref 3.5–5.0)
Alkaline Phosphatase: 53 U/L (ref 38–126)
Anion gap: 7 (ref 5–15)
BUN: 11 mg/dL (ref 8–23)
CO2: 29 mmol/L (ref 22–32)
Calcium: 8.8 mg/dL — ABNORMAL LOW (ref 8.9–10.3)
Chloride: 100 mmol/L (ref 98–111)
Creatinine, Ser: 0.83 mg/dL (ref 0.61–1.24)
GFR calc Af Amer: >60 mL/min (ref >60)
GFR calc non Af Amer: >60 mL/min (ref >60)
Glucose, Bld: 121 mg/dL — ABNORMAL HIGH (ref 70–99)
Potassium: 4.3 mmol/L (ref 3.5–5.1)
SODIUM: 136 mmol/L (ref 135–145)
Total Bilirubin: 0.8 mg/dL (ref 0.3–1.2)
Total Protein: 6.5 g/dL (ref 6.5–8.1)

## 2018-04-19 LAB — CBC WITH DIFFERENTIAL/PLATELET
Abs Immature Granulocytes: 0.03 10*3/uL (ref 0.00–0.07)
Basophils Absolute: 0 10*3/uL (ref 0.0–0.1)
Basophils Relative: 0 %
Eosinophils Absolute: 0.3 10*3/uL (ref 0.0–0.5)
Eosinophils Relative: 4 %
HCT: 38 % — ABNORMAL LOW (ref 39.0–52.0)
Hemoglobin: 12.2 g/dL — ABNORMAL LOW (ref 13.0–17.0)
IMMATURE GRANULOCYTES: 0 %
Lymphocytes Relative: 30 %
Lymphs Abs: 2.1 10*3/uL (ref 0.7–4.0)
MCH: 35.7 pg — ABNORMAL HIGH (ref 26.0–34.0)
MCHC: 32.1 g/dL (ref 30.0–36.0)
MCV: 111.1 fL — ABNORMAL HIGH (ref 80.0–100.0)
Monocytes Absolute: 0.8 10*3/uL (ref 0.1–1.0)
Monocytes Relative: 11 %
NEUTROS ABS: 3.7 10*3/uL (ref 1.7–7.7)
Neutrophils Relative %: 55 %
Platelets: 162 10*3/uL (ref 150–400)
RBC: 3.42 MIL/uL — ABNORMAL LOW (ref 4.22–5.81)
RDW: 14.2 % (ref 11.5–15.5)
SMEAR REVIEW: NORMAL
WBC: 6.9 10*3/uL (ref 4.0–10.5)
nRBC: 0 % (ref 0.0–0.2)

## 2018-04-19 LAB — URINALYSIS, COMPLETE (UACMP) WITH MICROSCOPIC
Bacteria, UA: NONE SEEN
Bilirubin Urine: NEGATIVE
Glucose, UA: NEGATIVE mg/dL
Hgb urine dipstick: NEGATIVE
KETONES UR: NEGATIVE mg/dL
LEUKOCYTE UA: NEGATIVE
Nitrite: NEGATIVE
Protein, ur: 30 mg/dL — AB
Specific Gravity, Urine: 1.025 (ref 1.005–1.030)
Squamous Epithelial / HPF: NONE SEEN (ref 0–5)
pH: 5 (ref 5.0–8.0)

## 2018-04-19 LAB — VALPROIC ACID LEVEL: Valproic Acid Lvl: 77 ug/mL (ref 50.0–100.0)

## 2018-04-19 LAB — TROPONIN I: Troponin I: <0.03 ng/mL (ref <0.03)

## 2018-04-19 MED ORDER — ATORVASTATIN CALCIUM 20 MG PO TABS
40.0000 mg | ORAL_TABLET | Freq: Every day | ORAL | Status: DC
  Administered 2018-04-20 – 2018-04-21 (×2): 40 mg via ORAL
  Filled 2018-04-19 (×2): qty 2

## 2018-04-19 MED ORDER — DILTIAZEM HCL ER COATED BEADS 240 MG PO CP24
240.0000 mg | ORAL_CAPSULE | Freq: Every day | ORAL | Status: DC
  Administered 2018-04-20 – 2018-04-22 (×3): 240 mg via ORAL
  Filled 2018-04-19 (×3): qty 1

## 2018-04-19 MED ORDER — RENA-VITE PO TABS
1.0000 | ORAL_TABLET | Freq: Every day | ORAL | Status: DC
  Administered 2018-04-20 – 2018-04-22 (×3): 1 via ORAL
  Filled 2018-04-19 (×3): qty 1

## 2018-04-19 MED ORDER — STROKE: EARLY STAGES OF RECOVERY BOOK
Freq: Once | Status: AC
  Administered 2018-04-19: 21:00:00

## 2018-04-19 MED ORDER — SERTRALINE HCL 50 MG PO TABS
100.0000 mg | ORAL_TABLET | Freq: Every day | ORAL | Status: DC
  Administered 2018-04-20 – 2018-04-22 (×3): 100 mg via ORAL
  Filled 2018-04-19 (×3): qty 2

## 2018-04-19 MED ORDER — ACETAMINOPHEN 650 MG RE SUPP: 650.0000 mg | RECTAL | Status: DC | PRN

## 2018-04-19 MED ORDER — LORATADINE 10 MG PO TABS
10.0000 mg | ORAL_TABLET | Freq: Every day | ORAL | Status: DC
  Administered 2018-04-20 – 2018-04-22 (×3): 10 mg via ORAL
  Filled 2018-04-19 (×3): qty 1

## 2018-04-19 MED ORDER — POLYETHYLENE GLYCOL 3350 17 G PO PACK
17.0000 g | PACK | Freq: Every day | ORAL | Status: DC
  Administered 2018-04-20 – 2018-04-22 (×3): 17 g via ORAL
  Filled 2018-04-19 (×3): qty 1

## 2018-04-19 MED ORDER — CALCIUM POLYCARBOPHIL 625 MG PO TABS
625.0000 mg | ORAL_TABLET | Freq: Every day | ORAL | Status: DC
  Administered 2018-04-20 – 2018-04-22 (×3): 625 mg via ORAL
  Filled 2018-04-19 (×3): qty 1

## 2018-04-19 MED ORDER — ACETAMINOPHEN 160 MG/5ML PO SOLN
650.0000 mg | ORAL | Status: DC | PRN
  Filled 2018-04-19: qty 20.3

## 2018-04-19 MED ORDER — DOCUSATE SODIUM 100 MG PO CAPS
100.0000 mg | ORAL_CAPSULE | Freq: Two times a day (BID) | ORAL | Status: DC
  Administered 2018-04-20 – 2018-04-22 (×5): 100 mg via ORAL
  Filled 2018-04-19 (×5): qty 1

## 2018-04-19 MED ORDER — HYDROCODONE-ACETAMINOPHEN 5-325 MG PO TABS: 1.0000 | ORAL_TABLET | ORAL | Status: DC | PRN

## 2018-04-19 MED ORDER — SENNOSIDES-DOCUSATE SODIUM 8.6-50 MG PO TABS
2.0000 | ORAL_TABLET | Freq: Every day | ORAL | Status: DC
  Administered 2018-04-20 – 2018-04-21 (×2): 2 via ORAL
  Filled 2018-04-19 (×2): qty 2

## 2018-04-19 MED ORDER — LEVETIRACETAM 100 MG/ML PO SOLN
1500.0000 mg | Freq: Two times a day (BID) | ORAL | Status: DC
  Administered 2018-04-19 – 2018-04-21 (×5): 1500 mg via ORAL
  Filled 2018-04-19 (×6): qty 15

## 2018-04-19 MED ORDER — SODIUM CHLORIDE 0.9 % IV SOLN
INTRAVENOUS | Status: DC
  Administered 2018-04-19 – 2018-04-20 (×2): via INTRAVENOUS

## 2018-04-19 MED ORDER — ACETAMINOPHEN 325 MG PO TABS: 650.0000 mg | ORAL_TABLET | ORAL | Status: DC | PRN

## 2018-04-19 MED ORDER — DIVALPROEX SODIUM 500 MG PO DR TAB
500.0000 mg | DELAYED_RELEASE_TABLET | Freq: Three times a day (TID) | ORAL | Status: DC
  Administered 2018-04-19 – 2018-04-22 (×8): 500 mg via ORAL
  Filled 2018-04-19 (×11): qty 1

## 2018-04-19 MED ORDER — LACOSAMIDE 50 MG PO TABS
200.0000 mg | ORAL_TABLET | Freq: Two times a day (BID) | ORAL | Status: DC
  Administered 2018-04-19 – 2018-04-22 (×6): 200 mg via ORAL
  Filled 2018-04-19 (×6): qty 4

## 2018-04-19 MED ORDER — ZONISAMIDE 100 MG PO CAPS
100.0000 mg | ORAL_CAPSULE | Freq: Every day | ORAL | Status: DC
  Administered 2018-04-19 – 2018-04-21 (×3): 100 mg via ORAL
  Filled 2018-04-19 (×5): qty 1

## 2018-04-19 MED ORDER — SENNOSIDES-DOCUSATE SODIUM 8.6-50 MG PO TABS
1.0000 | ORAL_TABLET | Freq: Every evening | ORAL | Status: DC | PRN
  Administered 2018-04-20: 21:00:00 1 via ORAL

## 2018-04-19 NOTE — ED Notes (Addendum)
Pt was able to provide urine sample. UA sent to lab.

## 2018-04-19 NOTE — ED Notes (Signed)
Richardson Dopp RN,  [facility] bed assigned

## 2018-04-19 NOTE — ED Notes (Signed)
Nurse unavailable at this time to take report. Will call back.

## 2018-04-19 NOTE — ED Notes (Signed)
Neurologist and elizabeth NP at bedside

## 2018-04-19 NOTE — Consult Note (Addendum)
Referring Physician: Rockne Menghini, MD    Chief Complaint: Aphasia, intermittent right arm weakness, difficulty walking.  HPI: Alvin Hudson is an 83 y.o. male with past medical history of seizure on Depakote, Keppra and Vimpat following with neurology Dr. Rulon Eisenmenger at [facility], A. fib with RVR not on anticoagulation, SDH s/p evacuation, and left M2 high-grade stenosis presenting to the ED today with complaints of intermittent right arm weakness, worsening aphasia and difficulty walking.  Per ED report, family reported a decline in health and not being at baseline since he was discharged from [facility] in February following craniotomy for subdural hematoma.  Per family members, patient has been having intermittent right arm weakness, difficulty with walking and speech for unknown period of time.  No reports of associated altered sensorium, cranial nerve deficit, witnessed seizure like activity, focal motor or sensory deficits, diplopia, nausea or vomiting, syncope or LOC, paresthesia (numbness, tingling, pins-and-needles sensation) or a heavy feeling in an extremity. On arrival to the ED, he was alert but aphasic. Blood pressure 122/68 mm Hg and pulse rate 62 beats/min. A non-contrast head CT was obtained and showed new hypodensity left frontal cortex compatible with acute infarct.  Patient was previously admitted to [facility] on 03/13/2018 with progressive headache, aphasia along with possible episode of clonic seizure during sleep.  Work-up at that time with CT head showed left temporoparietal subdural hematoma likely from a previous fall which occurred on 03/01/2018.  He was discharged however came back with altered mental status, global aphasia, right arm weakness and visual hallucinations.  Work-up at that time including CT head and EEG was unremarkable.  Symptoms were presumed to be likely due to seizure therefore was loaded with Keppra and continued on maintenance dose of 750 twice daily.  He had  a long-term EEG during that hospitalization with intermittent abnormalities on separate readings therefore  AEDs were optimized.  Patient was discharged on 2/23 with Depakote 500 mg 3 times daily, lacosamide 200 mg every 12 hours and Keppra 1500 mg twice daily.  Patient reports that his been taking his medication consistently without missing a dose.  He will be admitted for further work-up and management of possible seizure/stroke.  Date last known well: Unable to determine Time last known well: Unable to determine tPA Given: No: Unable to determine last known well, recent hemorrhage/craniotomy  Past Medical History:  Diagnosis Date  . Allergy   . Seizures (HCC)     Past Surgical History:  Procedure Laterality Date  . CRANIOTOMY Left 03/10/2018   Procedure: CRANIOTOMY HEMATOMA EVACUATION SUBDURAL;  Surgeon: Tressie Stalker, MD;  Location: [facility] OR;  Service: Neurosurgery;  Laterality: Left;  . INGUINAL HERNIA REPAIR Bilateral     History reviewed. No pertinent family history. Celiac disease Daughter Tamela Oddi   Stroke Father    Cancer Mother  Breast  Heart failure Mother    Hypertension Sister Darel Hong    Relation Name Status Comments  Daughter Chrissy Alive   Daughter Tamela Oddi Alive   Father  Deceased (Age 3)   Mother  Deceased   Sister Optician, dispensing     Social History:  reports that he has never smoked. He has never used smokeless tobacco. He reports current alcohol use of about 14.0 standard drinks of alcohol per week. He reports that he does not use drugs.  Allergies:  Allergies  Allergen Reactions  . Aspirin Other (See Comments)    Caused ulcers  . Naproxen Other (See Comments)    Caused ulcers  Medications: I have reviewed the patient's current medications. Prior to Admission:   Prior to Admission medications   Medication Sig Start Date End Date Taking? Authorizing Provider  acetaminophen (TYLENOL) 325 MG tablet Take 2 tablets (650 mg total) by mouth every 4  (four) hours as needed for mild pain (temp > 100.5). 04/11/18   Angiulli, Mcarthur Rossetti, PA-C  atorvastatin (LIPITOR) 20 MG tablet Take 1 tablet (20 mg total) by mouth daily. 04/11/18   Angiulli, Mcarthur Rossetti, PA-C  B Complex-C-Folic Acid (B COMPLEX-VITAMIN C-FOLIC ACID) 1 MG tablet Take 1 tablet by mouth daily. 06/25/15   [provider]  diltiazem (CARDIZEM CD) 240 MG 24 hr capsule Take 1 capsule (240 mg total) by mouth daily. 04/11/18   Angiulli, Mcarthur Rossetti, PA-C  divalproex (DEPAKOTE) 500 MG DR tablet Take 1 tablet (500 mg total) by mouth every 8 (eight) hours. 04/11/18   Angiulli, Mcarthur Rossetti, PA-C  docusate sodium (COLACE) 100 MG capsule Take 1 capsule (100 mg total) by mouth 2 (two) times daily. 03/12/18   Tressie Stalker, MD  HYDROcodone-acetaminophen (NORCO/VICODIN) 5-325 MG tablet Take 1 tablet by mouth every 4 (four) hours as needed for moderate pain. 04/11/18   Angiulli, Mcarthur Rossetti, PA-C  lacosamide (VIMPAT) 200 MG TABS tablet Take 1 tablet (200 mg total) by mouth 2 (two) times daily. 04/11/18   Angiulli, Mcarthur Rossetti, PA-C  levETIRAcetam (KEPPRA) 100 MG/ML solution Take 15 mLs (1,500 mg total) by mouth 2 (two) times daily. 04/11/18   Angiulli, Mcarthur Rossetti, PA-C  loratadine (CLARITIN) 10 MG tablet Take 1 tablet (10 mg total) by mouth daily. 04/11/18   Angiulli, Mcarthur Rossetti, PA-C  polycarbophil (FIBERCON) 625 MG tablet Take 625 mg by mouth daily.    [provider]  polyethylene glycol (MIRALAX / GLYCOLAX) packet Take 17 g by mouth daily. 04/11/18   Angiulli, Mcarthur Rossetti, PA-C  senna-docusate (SENOKOT-S) 8.6-50 MG tablet Take 2 tablets by mouth at bedtime. 04/11/18   Angiulli, Mcarthur Rossetti, PA-C  sertraline (ZOLOFT) 100 MG tablet Take 1 tablet (100 mg total) by mouth daily. 04/11/18   Angiulli, Mcarthur Rossetti, PA-C   ROS: Unable to obtain from patient due to significant aphasia.  Physical Examination: Blood pressure 108/87, pulse 60, temperature 98 F (36.7 C), temperature source Oral, resp. rate (!) 21, height   (1.803 m), weight 66.4 kg, SpO2 98 %.   HEENT-  Normocephalic, no lesions, without obvious abnormality.  Normal external eye and conjunctiva.  Normal TM's bilaterally.  Normal auditory canals and external ears. Normal external nose, mucus membranes and septum.  Normal pharynx. Cardiovascular- S1, S2 normal, pulses palpable throughout   Lungs- chest clear, no wheezing, rales, normal symmetric air entry Abdomen- soft, non-tender; bowel sounds normal; no masses,  no organomegaly Extremities- no edema Lymph-no adenopathy palpable Musculoskeletal-no joint tenderness, deformity or swelling Skin-warm and dry, no hyperpigmentation, vitiligo, or suspicious lesions  Neurological Exam   Mental Status: Alert.  Patient initially fluent but with further conversation became significantly aphasic. Able to follow some commands but a times required significant reinforcement. Cranial Nerves: II: Blinks to bilateral confrontation, pupils equal, round, reactive to light and accommodation III,IV, VI: ptosis not present, extra-ocular motions intact bilaterally V,VII: smile symmetric, facial light touch sensation intact VIII: hearing normal bilaterally IX,X: gag reflex present XI: bilateral shoulder shrug XII: midline tongue extension Motor: 5/5 throughout with no focal weakness noted Sensory: Pinprick and light touch intact bilaterally Deep Tendon Reflexes: 1+ and symmetric with absent AJ's bilaterally Plantars: Right:  mute                              Left: mute Cerebellar: Finger-to-nose testing intact bilaterally. Heel to shin testing normal bilaterally Gait: not tested due to safety concerns  Laboratory Studies:  Basic Metabolic Panel: Recent Labs  Lab 04/19/18 1537  NA 136  K 4.3  CL 100  CO2 29  GLUCOSE 121*  BUN 11  CREATININE 0.83  CALCIUM 8.8*    Liver Function Tests: Recent Labs  Lab 04/19/18 1537  AST 31  ALT 21  ALKPHOS 53  BILITOT 0.8  PROT 6.5  ALBUMIN 3.4*   No  results for input(s): LIPASE, AMYLASE in the last 168 hours. No results for input(s): AMMONIA in the last 168 hours.  CBC: Recent Labs  Lab 04/19/18 1537  WBC 6.9  NEUTROABS 3.7  HGB 12.2*  HCT 38.0*  MCV 111.1*  PLT 162    Cardiac Enzymes: Recent Labs  Lab 04/19/18 1537  TROPONINI <0.03    BNP: Invalid input(s): POCBNP  CBG: No results for input(s): GLUCAP in the last 168 hours.  Microbiology: Results for orders placed or performed during the hospital encounter of 03/12/18  Culture, blood (Routine X 2) w Reflex to ID Panel     Status: None   Collection Time: 03/13/18  2:35 PM  Result Value Ref Range Status   Specimen Description BLOOD RIGHT ANTECUBITAL  Final   Special Requests   Final    BOTTLES DRAWN AEROBIC AND ANAEROBIC Blood Culture adequate volume Performed at [facility], [address] [place], [place] [address]    Culture NO GROWTH 5 DAYS  Final   Report Status 03/18/2018 FINAL  Final  Culture, blood (Routine X 2) w Reflex to ID Panel     Status: None   Collection Time: 03/13/18  2:45 PM  Result Value Ref Range Status   Specimen Description BLOOD RIGHT HAND  Final   Special Requests   Final    BOTTLES DRAWN AEROBIC ONLY Blood Culture results may not be optimal due to an inadequate volume of blood received in culture bottles Performed at [facility], [address] [place], [place] [address]    Culture NO GROWTH 5 DAYS  Final   Report Status 03/18/2018 FINAL  Final    Coagulation Studies: No results for input(s): LABPROT, INR in the last 72 hours.  Urinalysis:  Recent Labs  Lab 04/19/18 1537  COLORURINE AMBER*  LABSPEC 1.025  PHURINE 5.0  GLUCOSEU NEGATIVE  HGBUR NEGATIVE  BILIRUBINUR NEGATIVE  KETONESUR NEGATIVE  PROTEINUR 30*  NITRITE NEGATIVE  LEUKOCYTESUR NEGATIVE    Lipid Panel: No results found for: CHOL, TRIG, HDL, CHOLHDL, VLDL, LDLCALC  HgbA1C: No results found for: HGBA1C  Urine Drug Screen:  No results  found for: LABOPIA, COCAINSCRNUR, LABBENZ, AMPHETMU, THCU, LABBARB  Alcohol Level: No results for input(s): ETH in the last 168 hours.  Other results: EKG: sinus rhythm at 61 BPM, prolonged PR interval  Imaging: Ct Head Wo Contrast  Result Date: 04/19/2018 CLINICAL DATA:  Speech difficulty. Left craniotomy 03/10/2018 for subdural hematoma EXAM: CT HEAD WITHOUT CONTRAST TECHNIQUE: Contiguous axial images were obtained from the base of the skull through the vertex without intravenous contrast. COMPARISON:  CT head 03/18/2018 FINDINGS: Brain: Moderate to advanced atrophy.  Negative for hydrocephalus. Left-sided subdural fluid collection is predominately low-density with thickened dura present. Fluid collection measures approximately 7.5  mm in thickness. Previously gas was present in this collection due to surgery. No hyperdense subdural hematoma. Slight midline shift to the right 2 mm. Cortical edema in the left frontal lobe is new since the prior study and is consistent with acute infarct causing speech difficulty. Mild chronic ischemic change in the white matter. Vascular: Negative for hyperdense vessel. Skull: Left frontal craniotomy.  No acute skeletal abnormality. Sinuses/Orbits: Negative Other: None IMPRESSION: Chronic low-density subdural fluid collection on the left. No acute hemorrhage. Mild mass-effect and mild midline shift to the right of 2 mm New hypodensity left frontal cortex compatible with acute infarct causing patient's current symptoms of speech difficulty. These results were called by telephone at the time of interpretation on 04/19/2018 at 4:12 pm to Dr. Rockne Menghini , who verbally acknowledged these results. Electronically Signed   By: Marlan Palau M.D.   On: 04/19/2018 16:13   Mr Brain Wo Contrast  Result Date: 04/19/2018 CLINICAL DATA:  Initial evaluation for acute fatigue E, recent craniotomy for subdural evacuation. EXAM: MRI HEAD WITHOUT CONTRAST TECHNIQUE: Multiplanar,  multiecho pulse sequences of the brain and surrounding structures were obtained without intravenous contrast. COMPARISON:  Prior CT from earlier same day as well as earlier studies. FINDINGS: Brain: Chronic residual left subdural collection overlies the left frontotemporal convexity, measuring up to 1 cm in maximal diameter. Mild mass effect on the subjacent left cerebral hemisphere with trace 2 mm left-to-right shift. Focal 2.7 cm area of T2/FLAIR hyperintensity within the subjacent anterior left frontal lobe corresponds with hyperdensity seen on prior CT (series 9, image 20). No associated significant restricted diffusion. Finding favored to reflect sequelae of subacute ischemia. No associated hemorrhage or Underlying age-related cerebral atrophy. No other acute or subacute ischemic infarct. Gray-white matter differentiation otherwise maintained. No other evidence for acute or chronic intracranial hemorrhage. No other mass lesion.  No hydrocephalus.  Pituitary gland normal. Vascular: Major intracranial vascular flow voids maintained. Skull and upper cervical spine: Craniocervical junction normal. Degenerative spondylolysis noted within the partially visualized upper cervical spine. Bone marrow signal intensity normal. Prior left craniotomy. No scalp soft tissue abnormality. Sinuses/Orbits: Globes and orbital soft tissues within normal limits. Mucosal thickening with superimposed retention cyst noted within the left maxillary sinus. Paranasal sinuses are otherwise clear. No mastoid effusion. Inner ear structures normal. Other: None. IMPRESSION: 1. Focal 2.7 cm area of T2/FLAIR signal abnormality involving the anterior left frontal lobe, corresponding with hypodensity seen on prior CT. Finding is nonspecific, but favored to reflect sequelae of subacute ischemia. Edema from a possible underlying mass would be difficult to exclude, and further assessment with postcontrast imaging suggested for complete evaluation.  Irregardless, a short interval follow-up MRI to ensure these changes resolve is recommended. 2. Chronic left subdural collection measuring up to 10 mm in maximal thickness with associated trace 2 mm left-to-right shift. 3. Sequelae of prior left craniotomy. Electronically Signed   By: Rise Mu M.D.   On: 04/19/2018 17:50   Patient seen and examined.  Clinical course and management discussed.  Necessary edits performed.  I agree with the above.  Assessment and plan of care developed and discussed below.  Assessment: 83 y.o. male with past medical history of seizure on Depakote, Keppra and Vimpat following with neurology Dr. Rulon Eisenmenger at [facility], A. fib with RVR not on anticoagulation, SDH s/p evacuation, and possible left M2 high-grade stenosis presenting to the ED today with complaints of intermittent right arm weakness, worsening aphasia and difficulty walking.  Initial CT  head reviewed and shows hypodensity in the left frontal cortex compatible with acute infarct.  Patient was previously admitted to [facility] with similar symptoms, work-up at that time including CT head and MRI of brain showed subdural hematoma s/p evacuation but was negative for acute infarct.  CTA showed only mild stenosis of left M1 segment.  Patient had 5 clinical seizures during that admission 03/14/2018, and 3 simple partial seizures 03/15/2018 and was placed on LTM EEG monitoring.  EEG was noted to be abnormal due to focal slowing in the left hemispheric region.  Patient was discharged on Keppra, Vimpat and Depakote.  Patient reports that he has been taking his medications consistently.  Patient was not on any antiplatelet or anticoagulation prior to this event due to history of subdural hematoma and recent surgical intervention. Current head CT shows new area of hypodensity in the area of patient's past hemorrhage and surgery.  Although there is concern for acute infarct and patient with risk factors suspect this is not  the case.  Follow up MRI of the brain performed and confirms this area of abnormality.  It is felt to be nonspecific and again can not rule out a subacute infarct.  Sequelae from previous hemorrhage and surgery noted.  Seizure remains in the differential as well.  Patient on three anticonvulsants-Vimpat, Depakote and Keppra.  Patient at max doses of Keppra and Vimpat.  Valproic acid level is 77.    Stroke Risk Factors - atrial fibrillation, family history and hyperlipidemia  Plan: 1. Will repeat MRI of the brain with contrast in 1-2 days 2. Will not start anti-platelet or anticoagulation due to history of subdural hematoma and recent surgery 3. EEG  4. Seizure precautions 5. Ativan prn seizure activity 6. Continue current anticonvulsant therapy at home doses 7. Start Zonegran 100mg  daily 8. PT consult, OT consult, Speech consult 9. NPO until RN stroke swallow screen 10. Telemetry monitoring 11. Frequent neuro checks  This patient was staffed with Dr. Verlon Au, Thad Ranger who personally evaluated patient, reviewed documentation and agreed with assessment and plan of care as above.  Webb Silversmith, DNP, FNP-BC Board certified Nurse Practitioner Neurology Department   04/19/2018, 6:14 PM  Thana Farr, MD Neurology [phone]  04/19/2018  6:41 PM

## 2018-04-19 NOTE — Telephone Encounter (Signed)
lvm for pt to call office for 6-8 week f/u with Dr. Katrinka Blazing or Norma Fredrickson, NP. Due to cancellation from COVID-19.

## 2018-04-19 NOTE — ED Notes (Signed)
Pt given phone 

## 2018-04-19 NOTE — ED Notes (Signed)
Neurologist at bedside. 

## 2018-04-19 NOTE — ED Provider Notes (Signed)
[facility] Emergency Department Provider Note  ____________________________________________  Time seen: Approximately 3:48 PM  I have reviewed the triage vital signs and the nursing notes.   HISTORY  Chief Complaint Altered Mental Status    HPI Alvin Hudson is a 83 y.o. male with a history of traumatic subdural hematoma status post left frontotemporal parietal craniotomy evacuation 03/10/18, 3 of seizure disorder on Depakote, Vimpat and Keppra, new A. fib that is rate controlled but not anticoagulated, presenting for right arm weakness, worsening expressive aphasia and difficulty walking.  The patient reports that the main symptom that brought him here is that over the last several days he has had intermittent weakness in the right hand and that he is right-hand dominant.  He does not feel that his expressive aphasia or walking are any worse, and EMS reports he was able to use his walker to walk to the ambulance.  However, his wife with whom he lives felt that both of those symptoms were also worse.  The patient denies any headache, new visual changes, no numbness or tingling.  He has not had any new trauma or changes in his medications.  No infectious symptoms including cough cold or fever, nausea vomiting or diarrhea.  No dysuria.  Blood sugar and vital signs were normal by EMS.   Past Medical History:  Diagnosis Date  . Allergy   . Seizures [facility])     Patient Active Problem List   Diagnosis Date Noted  . CVA (cerebral vascular accident) (HCC) 04/19/2018  . Anemia of chronic disease   . Megaloblastic anemia   . Hypoalbuminemia due to protein-calorie malnutrition (HCC)   . Leukocytosis   . New onset atrial fibrillation (HCC)   . Seizures (HCC)   . 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 History of subdural hematoma   . Dysphagia   . Atrial fibrillation (HCC) 03/17/2018  . Aphasia 03/12/2018  . Seizure (HCC) 03/12/2018  . Subdural hematoma (HCC)  03/10/2018    Past Surgical History:  Procedure Laterality Date  . CRANIOTOMY Left 03/10/2018   Procedure: CRANIOTOMY HEMATOMA EVACUATION SUBDURAL;  Surgeon: Tressie Stalker, MD;  Location: [facility] OR;  Service: Neurosurgery;  Laterality: Left;  . INGUINAL HERNIA REPAIR Bilateral       Allergies Aspirin and Naproxen  History reviewed. No pertinent family history.  Social History Social History   Tobacco Use  . Smoking status: Never Smoker  . Smokeless tobacco: Never Used  Substance Use Topics  . Alcohol use: Yes    Alcohol/week: 14.0 standard drinks    Types: 14 Standard drinks or equivalent per week    Comment: 2 martinis/ night per pt  . Drug use: No    Review of Systems Constitutional: No fever/chills.  No lightheadedness or syncope.  No trauma. Eyes: No visual changes.  No blurred or double vision. ENT: No sore throat. No congestion or rhinorrhea. Cardiovascular: Denies chest pain. Denies palpitations. Respiratory: Denies shortness of breath.  No cough. Gastrointestinal: No abdominal pain.  No nausea, no vomiting.  No diarrhea.  No constipation. Genitourinary: Negative for dysuria. Musculoskeletal: Negative for back pain. Skin: Negative for rash. Neurological: Additive for expressive aphasia and difficulty walking, either at baseline or worsened.  Positive for right arm weakness which is new.  No changes in vision, or mental status.    ____________________________________________   PHYSICAL EXAM:  VITAL SIGNS: ED Triage Vitals  Enc Vitals Group     BP 04/19/18 1545 120/78     Pulse  Rate 04/19/18 1545 63     Resp 04/19/18 1545 19     Temp 04/19/18 1545 98 F (36.7 C)     Temp Source 04/19/18 1545 Oral     SpO2 04/19/18 1545 97 %     Weight 04/19/18 1546 146 lb 6.2 oz (66.4 kg)     Height 04/19/18 1546 5\' 11"  (1.803 m)     Head Circumference --      Peak Flow --      Pain Score 04/19/18 1546 0     Pain Loc --      Pain Edu? --      Excl. in GC? --      Constitutional: Patient is alert and has expressive aphasia but is able to answer questions appropriately. Eyes: Conjunctivae are normal.  EOMI. PERRLA.  No scleral icterus. Head: Atraumatic. Nose: No congestion/rhinnorhea. Mouth/Throat: Mucous membranes are mildly dry.  Neck: No stridor.  Supple.  No JVD.  No meningismus. Cardiovascular: Normal rate, regular rhythm. No murmurs, rubs or gallops.  Respiratory: Normal respiratory effort.  No accessory muscle use or retractions. Lungs CTAB.  No wheezes, rales or ronchi. Gastrointestinal: Soft, nontender and nondistended.  No guarding or rebound.  No peritoneal signs. Musculoskeletal: No LE edema. No ttp in the calves or palpable cords.  Negative Homan's sign. Neurologic: Alert and oriented 3.  Patient has expressive aphasia. Face and smile symmetric. Tongue is midline.  EOMI.  PERRLA.  No horizontal or vertical nystagmus.  No pronator drift. 5 out of 5 grip, biceps, triceps, hip flexors, plantar flexion and dorsiflexion. Normal sensation to light touch in the bilateral upper and lower extremities, and face.  Skin:  Skin is warm, dry and intact. No rash noted. Psychiatric: Mood and affect are normal.   ____________________________________________   LABS (all labs ordered are listed, but only abnormal results are displayed)  Labs Reviewed  CBC WITH DIFFERENTIAL/PLATELET - Abnormal; Notable for the following components:      Result Value   RBC 3.42 (*)    Hemoglobin 12.2 (*)    HCT 38.0 (*)    MCV 111.1 (*)    MCH 35.7 (*)    All other components within normal limits  COMPREHENSIVE METABOLIC PANEL - Abnormal; Notable for the following components:   Glucose, Bld 121 (*)    Calcium 8.8 (*)    Albumin 3.4 (*)    All other components within normal limits  URINALYSIS, COMPLETE (UACMP) WITH MICROSCOPIC - Abnormal; Notable for the following components:   Color, Urine AMBER (*)    APPearance CLOUDY (*)    Protein, ur 30 (*)    All other  components within normal limits  TROPONIN I  VALPROIC ACID LEVEL  HEMOGLOBIN A1C  LIPID PANEL   ____________________________________________  EKG  ED ECG REPORT I, Anne-Caroline Sharma Covert, the attending physician, personally viewed and interpreted this ECG.   Date: 04/19/2018  EKG Time: 1538  Rate: NSR  Rhythm: normal sinus rhythm  Axis: leftward  Intervals:none  ST&T Change: Patient does have 2 mm of ST elevation in V3; 1 mm of ST elevation in V1 and V2.  1 to 2 mm of ST depression in V5 and V6.  He has an associated left bundle branch block. His EKG is compared to 03/14/2018 which showed A. fib but a left bundle branch block with elevation and depressions and morphology that is grossly unchanged. ____________________________________________  RADIOLOGY  Ct Head Wo Contrast  Result Date: 04/19/2018 CLINICAL DATA:  Speech  difficulty. Left craniotomy 03/10/2018 for subdural hematoma EXAM: CT HEAD WITHOUT CONTRAST TECHNIQUE: Contiguous axial images were obtained from the base of the skull through the vertex without intravenous contrast. COMPARISON:  CT head 03/18/2018 FINDINGS: Brain: Moderate to advanced atrophy.  Negative for hydrocephalus. Left-sided subdural fluid collection is predominately low-density with thickened dura present. Fluid collection measures approximately 7.5 mm in thickness. Previously gas was present in this collection due to surgery. No hyperdense subdural hematoma. Slight midline shift to the right 2 mm. Cortical edema in the left frontal lobe is new since the prior study and is consistent with acute infarct causing speech difficulty. Mild chronic ischemic change in the white matter. Vascular: Negative for hyperdense vessel. Skull: Left frontal craniotomy.  No acute skeletal abnormality. Sinuses/Orbits: Negative Other: None IMPRESSION: Chronic low-density subdural fluid collection on the left. No acute hemorrhage. Mild mass-effect and mild midline shift to the right of 2  mm New hypodensity left frontal cortex compatible with acute infarct causing patient's current symptoms of speech difficulty. These results were called by telephone at the time of interpretation on 04/19/2018 at 4:12 pm to Dr. Rockne Menghini , who verbally acknowledged these results. Electronically Signed   By: Marlan Palau M.D.   On: 04/19/2018 16:13   Mr Brain Wo Contrast  Result Date: 04/19/2018 CLINICAL DATA:  Initial evaluation for acute fatigue E, recent craniotomy for subdural evacuation. EXAM: MRI HEAD WITHOUT CONTRAST TECHNIQUE: Multiplanar, multiecho pulse sequences of the brain and surrounding structures were obtained without intravenous contrast. COMPARISON:  Prior CT from earlier same day as well as earlier studies. FINDINGS: Brain: Chronic residual left subdural collection overlies the left frontotemporal convexity, measuring up to 1 cm in maximal diameter. Mild mass effect on the subjacent left cerebral hemisphere with trace 2 mm left-to-right shift. Focal 2.7 cm area of T2/FLAIR hyperintensity within the subjacent anterior left frontal lobe corresponds with hyperdensity seen on prior CT (series 9, image 20). No associated significant restricted diffusion. Finding favored to reflect sequelae of subacute ischemia. No associated hemorrhage or Underlying age-related cerebral atrophy. No other acute or subacute ischemic infarct. Gray-white matter differentiation otherwise maintained. No other evidence for acute or chronic intracranial hemorrhage. No other mass lesion.  No hydrocephalus.  Pituitary gland normal. Vascular: Major intracranial vascular flow voids maintained. Skull and upper cervical spine: Craniocervical junction normal. Degenerative spondylolysis noted within the partially visualized upper cervical spine. Bone marrow signal intensity normal. Prior left craniotomy. No scalp soft tissue abnormality. Sinuses/Orbits: Globes and orbital soft tissues within normal limits. Mucosal  thickening with superimposed retention cyst noted within the left maxillary sinus. Paranasal sinuses are otherwise clear. No mastoid effusion. Inner ear structures normal. Other: None. IMPRESSION: 1. Focal 2.7 cm area of T2/FLAIR signal abnormality involving the anterior left frontal lobe, corresponding with hypodensity seen on prior CT. Finding is nonspecific, but favored to reflect sequelae of subacute ischemia. Edema from a possible underlying mass would be difficult to exclude, and further assessment with postcontrast imaging suggested for complete evaluation. Irregardless, a short interval follow-up MRI to ensure these changes resolve is recommended. 2. Chronic left subdural collection measuring up to 10 mm in maximal thickness with associated trace 2 mm left-to-right shift. 3. Sequelae of prior left craniotomy. Electronically Signed   By: Rise Mu M.D.   On: 04/19/2018 17:50   Dg Chest Port 1 View  Result Date: 04/19/2018 CLINICAL DATA:  CVA. EXAM: PORTABLE CHEST 1 VIEW COMPARISON:  03/19/2018 FINDINGS: Mild cardiomegaly improved from prior. Unchanged mediastinal contours.  Improved bibasilar aeration with resolved pleural effusion and bibasilar opacities. No acute airspace disease. No pneumothorax or pulmonary edema. IMPRESSION: 1. No acute chest findings. 2. Improved lung aeration from February. Improvement in cardiomegaly with mild residual. Electronically Signed   By: Narda RutherfordMelanie  Sanford M.D.   On: 04/19/2018 22:01    ____________________________________________   PROCEDURES  Procedure(s) performed: None  Procedures  Critical Care performed: Yes, see critical care note(s) ____________________________________________   INITIAL IMPRESSION / ASSESSMENT AND PLAN / ED COURSE  Pertinent labs & imaging results that were available during my care of the patient were reviewed by me and considered in my medical decision making (see chart for details).  83 y.o. male with a history of  traumatic subdural status post recent craniotomy evacuation, A. fib not anticoagulated, presenting with new right hand weakness and possible worsening of his baseline expressive aphasia and difficulty walking.  Overall, the patient is hemodynamically stable.  On my examination, his neurologic function is reassuring with the exception of his expressive aphasia.  His EKG does show a left bundle branch block with some elevation, but this is unchanged in morphology compared to prior; he is not in A. fib today.  I am concerned about possible stroke given that he likely has intermittent A. fib and is not anticoagulated.  We will also do a CT to rule out a new bleed.  Other possible etiologies include UTI or electrolyte dysfunction.  I spoke with Dr. Thad Rangereynolds, the neurologist on-call, who is placed the patient on the consultation list.  The patient does not meet criteria for TPA or acute emergent intervention.  His CT is pending at this time.  ----------------------------------------- 3:58 PM on 04/19/2018 -----------------------------------------  Patient has a stable anemia and a normal white blood cell count.  CRITICAL CARE Performed by: Rockne MenghiniAnne-Caroline Alasdair Kleve   Total critical care time: 40 minutes  Critical care time was exclusive of separately billable procedures and treating other patients.  Critical care was necessary to treat or prevent imminent or life-threatening deterioration.  Critical care was time spent personally by me on the following activities: development of treatment plan with patient and/or surrogate as well as nursing, discussions with consultants, evaluation of patient's response to treatment, examination of patient, obtaining history from patient or surrogate, ordering and performing treatments and interventions, ordering and review of laboratory studies, ordering and review of radiographic studies, pulse oximetry and re-evaluation of patient's  condition.   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

## 2018-04-19 NOTE — H&P (Signed)
[employer] [facility] [place] [facility]   PATIENT NAME: Alvin Hudson    MR#:  [id number]  DATE OF BIRTH:  August 13, 1935  DATE OF ADMISSION:  04/19/2018  PRIMARY CARE PHYSICIAN: Cain Sieve, MD   REQUESTING/REFERRING PHYSICIAN:   CHIEF COMPLAINT:   Chief Complaint  Patient presents with  . Altered Mental Status    HISTORY OF PRESENT ILLNESS: Alvin Hudson  is a 83 y.o. male with a known history per below which includes recent craniectomy for left subdural hematoma evacuation in February 2020, seizure disorder, history of A. fib not on oral anticoagulation, high-grade left M2 stenosis, presenting with left arm weakness, problems with speech, speech arrest, falls, difficulty ambulating,, generalized weakness, fatigue, in the emergency room patient was noted to have left frontal lobe 2.7 cm CVA/chronic subdural hematoma on the left with 10 mm of shift from left to right on MRI of the brain, neurology did see patient while in house-no anticoagulation was recommended, hospitalist asked to admit for further evaluation/care, patient evaluated in the emergency room noted to have dysarthria/slow mentation, mild confusion, poor historian, no family at the bedside, patient is now been admitted for acute left frontal ischemic cerebrovascular accident.  PAST MEDICAL HISTORY:   Past Medical History:  Diagnosis Date  . Allergy   . Seizures (HCC)     PAST SURGICAL HISTORY:  Past Surgical History:  Procedure Laterality Date  . CRANIOTOMY Left 03/10/2018   Procedure: CRANIOTOMY HEMATOMA EVACUATION SUBDURAL;  Surgeon: Tressie Stalker, MD;  Location: [facility] OR;  Service: Neurosurgery;  Laterality: Left;  . INGUINAL HERNIA REPAIR Bilateral     SOCIAL HISTORY:  Social History   Tobacco Use  . Smoking status: Never Smoker  . Smokeless tobacco: Never Used  Substance Use Topics  . Alcohol use: Yes    Alcohol/week: 14.0 standard drinks    Types: 14 Standard drinks or equivalent per week     Comment: 2 martinis/ night per pt    FAMILY HISTORY: History reviewed. No pertinent family history.  DRUG ALLERGIES:  Allergies  Allergen Reactions  . Aspirin Other (See Comments)    Caused ulcers  . Naproxen Other (See Comments)    Caused ulcers    REVIEW OF SYSTEMS:   CONSTITUTIONAL: No fever,+ fatigue, weakness.  EYES: No blurred or double vision.  EARS, NOSE, AND THROAT: No tinnitus or ear pain.  RESPIRATORY: No cough, shortness of breath, wheezing or hemoptysis.  CARDIOVASCULAR: No chest pain, orthopnea, edema.  GASTROINTESTINAL: No nausea, vomiting, diarrhea or abdominal pain.  GENITOURINARY: No dysuria, hematuria.  ENDOCRINE: No polyuria, nocturia,  HEMATOLOGY: No anemia, easy bruising or bleeding SKIN: No rash or lesion. MUSCULOSKELETAL: No joint pain or arthritis.   NEUROLOGIC: Right arm weakness, slurred speech, fall  PSYCHIATRY: No anxiety or depression.   MEDICATIONS AT HOME:  Prior to Admission medications   Medication Sig Start Date End Date Taking? Authorizing Provider  acetaminophen (TYLENOL) 325 MG tablet Take 2 tablets (650 mg total) by mouth every 4 (four) hours as needed for mild pain (temp > 100.5). 04/11/18   Angiulli, Mcarthur Rossetti, PA-C  atorvastatin (LIPITOR) 20 MG tablet Take 1 tablet (20 mg total) by mouth daily. 04/11/18   Angiulli, Mcarthur Rossetti, PA-C  B Complex-C-Folic Acid (B COMPLEX-VITAMIN C-FOLIC ACID) 1 MG tablet Take 1 tablet by mouth daily. 06/25/15   [provider]  diltiazem (CARDIZEM CD) 240 MG 24 hr capsule Take 1 capsule (240 mg total) by mouth daily. 04/11/18   Angiulli, Mcarthur Rossetti,  PA-C  divalproex (DEPAKOTE) 500 MG DR tablet Take 1 tablet (500 mg total) by mouth every 8 (eight) hours. 04/11/18   Angiulli, Mcarthur Rossetti, PA-C  docusate sodium (COLACE) 100 MG capsule Take 1 capsule (100 mg total) by mouth 2 (two) times daily. 03/12/18   Tressie Stalker, MD  HYDROcodone-acetaminophen (NORCO/VICODIN) 5-325 MG tablet Take 1 tablet by mouth every  4 (four) hours as needed for moderate pain. 04/11/18   Angiulli, Mcarthur Rossetti, PA-C  lacosamide (VIMPAT) 200 MG TABS tablet Take 1 tablet (200 mg total) by mouth 2 (two) times daily. 04/11/18   Angiulli, Mcarthur Rossetti, PA-C  levETIRAcetam (KEPPRA) 100 MG/ML solution Take 15 mLs (1,500 mg total) by mouth 2 (two) times daily. 04/11/18   Angiulli, Mcarthur Rossetti, PA-C  loratadine (CLARITIN) 10 MG tablet Take 1 tablet (10 mg total) by mouth daily. 04/11/18   Angiulli, Mcarthur Rossetti, PA-C  polycarbophil (FIBERCON) 625 MG tablet Take 625 mg by mouth daily.    [provider]  polyethylene glycol (MIRALAX / GLYCOLAX) packet Take 17 g by mouth daily. 04/11/18   Angiulli, Mcarthur Rossetti, PA-C  senna-docusate (SENOKOT-S) 8.6-50 MG tablet Take 2 tablets by mouth at bedtime. 04/11/18   Angiulli, Mcarthur Rossetti, PA-C  sertraline (ZOLOFT) 100 MG tablet Take 1 tablet (100 mg total) by mouth daily. 04/11/18   Angiulli, Mcarthur Rossetti, PA-C      PHYSICAL EXAMINATION:   VITAL SIGNS: Blood pressure 132/80, pulse 60, temperature 98 F (36.7 C), temperature source Oral, resp. rate 15, height  (1.803 m), weight 66.4 kg, SpO2 98 %.  GENERAL:  83 y.o.-year-old patient lying in the bed with no acute distress.  Frail-appearing EYES: Pupils equal, round, reactive to light and accommodation. No scleral icterus. Extraocular muscles intact.  HEENT: Head atraumatic, normocephalic. Oropharynx and nasopharynx clear.  NECK:  Supple, no jugular venous distention. No thyroid enlargement, no tenderness.  LUNGS: Normal breath sounds bilaterally, no wheezing, rales,rhonchi or crepitation. No use of accessory muscles of respiration.  CARDIOVASCULAR: S1, S2 normal. No murmurs, rubs, or gallops.  ABDOMEN: Soft, nontender, nondistended. Bowel sounds present. No organomegaly or mass.  EXTREMITIES: No pedal edema, cyanosis, or clubbing.  NEUROLOGIC: Cranial nerves II through XII are intact.  Global extremity weakness 4/5, noted confusion, slow mentation, word  finding, some speech arrest. Gait not checked.  PSYCHIATRIC: The patient is alert and oriented 2- 3.  SKIN: No obvious rash, lesion, or ulcer.   LABORATORY PANEL:   CBC Recent Labs  Lab 04/19/18 1537  WBC 6.9  HGB 12.2*  HCT 38.0*  PLT 162  MCV 111.1*  MCH 35.7*  MCHC 32.1  RDW 14.2  LYMPHSABS 2.1  MONOABS 0.8  EOSABS 0.3  BASOSABS 0.0   ------------------------------------------------------------------------------------------------------------------  Chemistries  Recent Labs  Lab 04/19/18 1537  NA 136  K 4.3  CL 100  CO2 29  GLUCOSE 121*  BUN 11  CREATININE 0.83  CALCIUM 8.8*  AST 31  ALT 21  ALKPHOS 53  BILITOT 0.8   ------------------------------------------------------------------------------------------------------------------ estimated creatinine clearance is 66.7 mL/min (by C-G formula based on SCr of 0.83 mg/dL). ------------------------------------------------------------------------------------------------------------------ No results for input(s): TSH, T4TOTAL, T3FREE, THYROIDAB in the last 72 hours.  Invalid input(s): FREET3   Coagulation profile No results for input(s): INR, PROTIME in the last 168 hours. ------------------------------------------------------------------------------------------------------------------- No results for input(s): DDIMER in the last 72 hours. -------------------------------------------------------------------------------------------------------------------  Cardiac Enzymes Recent Labs  Lab 04/19/18 1537  TROPONINI <0.03   ------------------------------------------------------------------------------------------------------------------ Invalid input(s): POCBNP  ---------------------------------------------------------------------------------------------------------------  Urinalysis  Component Value Date/Time   COLORURINE AMBER (A) 04/19/2018 1537   APPEARANCEUR CLOUDY (A) 04/19/2018 1537   LABSPEC  1.025 04/19/2018 1537   PHURINE 5.0 04/19/2018 1537   GLUCOSEU NEGATIVE 04/19/2018 1537   HGBUR NEGATIVE 04/19/2018 1537   BILIRUBINUR NEGATIVE 04/19/2018 1537   KETONESUR NEGATIVE 04/19/2018 1537   PROTEINUR 30 (A) 04/19/2018 1537   NITRITE NEGATIVE 04/19/2018 1537   LEUKOCYTESUR NEGATIVE 04/19/2018 1537     RADIOLOGY: Ct Head Wo Contrast  Result Date: 04/19/2018 CLINICAL DATA:  Speech difficulty. Left craniotomy 03/10/2018 for subdural hematoma EXAM: CT HEAD WITHOUT CONTRAST TECHNIQUE: Contiguous axial images were obtained from the base of the skull through the vertex without intravenous contrast. COMPARISON:  CT head 03/18/2018 FINDINGS: Brain: Moderate to advanced atrophy.  Negative for hydrocephalus. Left-sided subdural fluid collection is predominately low-density with thickened dura present. Fluid collection measures approximately 7.5 mm in thickness. Previously gas was present in this collection due to surgery. No hyperdense subdural hematoma. Slight midline shift to the right 2 mm. Cortical edema in the left frontal lobe is new since the prior study and is consistent with acute infarct causing speech difficulty. Mild chronic ischemic change in the white matter. Vascular: Negative for hyperdense vessel. Skull: Left frontal craniotomy.  No acute skeletal abnormality. Sinuses/Orbits: Negative Other: None IMPRESSION: Chronic low-density subdural fluid collection on the left. No acute hemorrhage. Mild mass-effect and mild midline shift to the right of 2 mm New hypodensity left frontal cortex compatible with acute infarct causing patient's current symptoms of speech difficulty. These results were called by telephone at the time of interpretation on 04/19/2018 at 4:12 pm to Dr. Rockne Menghini , who verbally acknowledged these results. Electronically Signed   By: Marlan Palau M.D.   On: 04/19/2018 16:13   Mr Brain Wo Contrast  Result Date: 04/19/2018 CLINICAL DATA:  Initial evaluation for  acute fatigue E, recent craniotomy for subdural evacuation. EXAM: MRI HEAD WITHOUT CONTRAST TECHNIQUE: Multiplanar, multiecho pulse sequences of the brain and surrounding structures were obtained without intravenous contrast. COMPARISON:  Prior CT from earlier same day as well as earlier studies. FINDINGS: Brain: Chronic residual left subdural collection overlies the left frontotemporal convexity, measuring up to 1 cm in maximal diameter. Mild mass effect on the subjacent left cerebral hemisphere with trace 2 mm left-to-right shift. Focal 2.7 cm area of T2/FLAIR hyperintensity within the subjacent anterior left frontal lobe corresponds with hyperdensity seen on prior CT (series 9, image 20). No associated significant restricted diffusion. Finding favored to reflect sequelae of subacute ischemia. No associated hemorrhage or Underlying age-related cerebral atrophy. No other acute or subacute ischemic infarct. Gray-white matter differentiation otherwise maintained. No other evidence for acute or chronic intracranial hemorrhage. No other mass lesion.  No hydrocephalus.  Pituitary gland normal. Vascular: Major intracranial vascular flow voids maintained. Skull and upper cervical spine: Craniocervical junction normal. Degenerative spondylolysis noted within the partially visualized upper cervical spine. Bone marrow signal intensity normal. Prior left craniotomy. No scalp soft tissue abnormality. Sinuses/Orbits: Globes and orbital soft tissues within normal limits. Mucosal thickening with superimposed retention cyst noted within the left maxillary sinus. Paranasal sinuses are otherwise clear. No mastoid effusion. Inner ear structures normal. Other: None. IMPRESSION: 1. Focal 2.7 cm area of T2/FLAIR signal abnormality involving the anterior left frontal lobe, corresponding with hypodensity seen on prior CT. Finding is nonspecific, but favored to reflect sequelae of subacute ischemia. Edema from a possible underlying mass  would be difficult to exclude, and further assessment with postcontrast  imaging suggested for complete evaluation. Irregardless, a short interval follow-up MRI to ensure these changes resolve is recommended. 2. Chronic left subdural collection measuring up to 10 mm in maximal thickness with associated trace 2 mm left-to-right shift. 3. Sequelae of prior left craniotomy. Electronically Signed   By: Rise Mu M.D.   On: 04/19/2018 17:50    EKG: Orders placed or performed during the hospital encounter of 04/19/18  . ED EKG  . ED EKG  . EKG 12-Lead  . EKG 12-Lead  . EKG 12-Lead  . EKG 12-Lead    IMPRESSION AND PLAN: *Acute left frontal ischemic cerebrovascular accident Noted history of A. fib, chronic left M2 stenosis, recent craniotomy for chronic subdural hematoma which is still present with associated shift Admit to regular nursing for bed on our CVA protocol, no anticoagulation given recent craniotomy, chronic subdural hematoma, increase statin therapy, neurochecks per routine, aspiration/fall/skin care precautions while in house, PT/OT to evaluate/treat, neurology consulted for expert opinion  *History of recent craniotomy for subdural hematoma Noted chronic subdural hematoma on the left 10 mm with left-to-right shift Conservative medical management, no anticoagulation  *History of A. Fib Avoid anticoagulants given intracranial hemorrhage, recent surgery/craniectomy, continue Cardizem  *Chronic seizure disorder Neurology following, continue Depakote, Vimpat, Keppra, seizure precautions, aspiration/fall precautions while in house  *Chronic deconditioning Increase nursing care PRN, dietary consulted, physical therapy to see   All the records are reviewed and case discussed with ED provider. Management plans discussed with the patient, family and they are in agreement.  CODE STATUS:DNR Code Status History    Date Active Date Inactive Code Status Order ID Comments User  Context   03/20/2018 1603 04/11/2018 1503 Full Code [id number]  Lynnae Prude Inpatient   03/20/2018 1603 03/20/2018 1603 Full Code [id number]  Lynnae Prude Inpatient   03/12/2018 2320 03/20/2018 1551 Full Code [id number]  Jadene Pierini, MD Inpatient   03/10/2018 1959 03/12/2018 1605 Full Code [id number]  Tressie Stalker, MD Inpatient    Advance Directive Documentation     Most Recent Value  Type of Advance Directive  Healthcare Power of Attorney, Living will  Pre-existing out of facility DNR order (yellow form or pink MOST form)  -  "MOST" Form in Place?  -       TOTAL TIME TAKING CARE OF THIS PATIENT: 40 minutes.    Evelena Asa Salary M.D on 04/19/2018   Between 7am to 6pm - Pager - [phone]  After 6pm go to [email or web address] - password [email or web address]  [facility]  Office  [phone]  CC: Primary care physician; Cain Sieve, MD   Note: This dictation was prepared with Dragon dictation along with smaller phrase technology. Any transcriptional errors that result from this process are unintentional.

## 2018-04-19 NOTE — ED Notes (Signed)
MD Salary at bedside 

## 2018-04-19 NOTE — ED Notes (Signed)
Patient transported to MRI 

## 2018-04-19 NOTE — ED Notes (Signed)
Pt back to room from MRI.

## 2018-04-19 NOTE — ED Notes (Signed)
Patient transported to CT 

## 2018-04-19 NOTE — ED Notes (Signed)
ED TO INPATIENT HANDOFF REPORT  ED Nurse Name and Phone #: [phone]  S Name/Age/Gender Alvin Hudson 83 y.o. male Room/Bed: ED14A/ED14A  Code Status   Code Status: Prior  Home/SNF/Other Home Patient oriented to: self, place Is this baseline? No   Triage Complete: Triage complete  Chief Complaint ems  Triage Note Patient arrived by EMS from home for c/o being "tired" Patients family reported a decline in health due to his walking and speech not at baseline since he was released from [facility] in February after craniotomy surgery. Denies pain. HX A-fib. EMS vitals 136 cbg, 99.2oral, 122/68 b/p, HR 62, 98%. EMs reports patient ambulated with walker to stretcher at home. Left hand grip stronger than right. Expressive aphasia present   Allergies Allergies  Allergen Reactions  . Aspirin Other (See Comments)    Caused ulcers  . Naproxen Other (See Comments)    Caused ulcers    Level of Care/Admitting Diagnosis ED Disposition    ED Disposition Condition Comment   Admit  Hospital Area: [facility] [[id number]]  Level of Care: Med-Surg [16]  Diagnosis: CVA (cerebral vascular accident) [facility]) [[id number]]  Admitting Physician: Bertrum Sol [[id number]]  Attending Physician: Bertrum Sol [[id number]]  Estimated length of stay: past midnight tomorrow  Certification:: I certify this patient will need inpatient services for at least 2 midnights  PT Class (Do Not Modify): Inpatient [101]  PT Acc Code (Do Not Modify): Private [1]       B Medical/Surgery History Past Medical History:  Diagnosis Date  . Allergy   . Seizures (HCC)    Past Surgical History:  Procedure Laterality Date  . CRANIOTOMY Left 03/10/2018   Procedure: CRANIOTOMY HEMATOMA EVACUATION SUBDURAL;  Surgeon: Tressie Stalker, MD;  Location: [facility] OR;  Service: Neurosurgery;  Laterality: Left;  . INGUINAL HERNIA REPAIR Bilateral      A IV Location/Drains/Wounds Patient Lines/Drains/Airways  Status   Active Line/Drains/Airways    Name:   Placement date:   Placement time:   Site:   Days:   Peripheral IV 04/19/18 Left Antecubital   04/19/18    1600    Antecubital   less than 1   Incision (Closed) 03/10/18 Head   03/10/18    1836     40          Intake/Output Last 24 hours No intake or output data in the 24 hours ending 04/19/18 1917  Labs/Imaging Results for orders placed or performed during the hospital encounter of 04/19/18 (from the past 48 hour(s))  CBC with Differential     Status: Abnormal   Collection Time: 04/19/18  3:37 PM  Result Value Ref Range   WBC 6.9 4.0 - 10.5 K/uL   RBC 3.42 (L) 4.22 - 5.81 MIL/uL   Hemoglobin 12.2 (L) 13.0 - 17.0 g/dL   HCT 08.6 (L) 57.8 - 46.9 %   MCV 111.1 (H) 80.0 - 100.0 fL   MCH 35.7 (H) 26.0 - 34.0 pg   MCHC 32.1 30.0 - 36.0 g/dL   RDW 62.9 52.8 - 41.3 %   Platelets 162 150 - 400 K/uL   nRBC 0.0 0.0 - 0.2 %   Neutrophils Relative % 55 %   Neutro Abs 3.7 1.7 - 7.7 K/uL   Lymphocytes Relative 30 %   Lymphs Abs 2.1 0.7 - 4.0 K/uL   Monocytes Relative 11 %   Monocytes Absolute 0.8 0.1 - 1.0 K/uL   Eosinophils Relative 4 %   Eosinophils Absolute  0.3 0.0 - 0.5 K/uL   Basophils Relative 0 %   Basophils Absolute 0.0 0.0 - 0.1 K/uL   WBC Morphology MORPHOLOGY UNREMARKABLE    RBC Morphology MORPHOLOGY UNREMARKABLE    Smear Review Normal platelet morphology    Immature Granulocytes 0 %   Abs Immature Granulocytes 0.03 0.00 - 0.07 K/uL    Comment: Performed at [facility], [address] [place], [place] [address]  Comprehensive metabolic panel     Status: Abnormal   Collection Time: 04/19/18  3:37 PM  Result Value Ref Range   Sodium 136 135 - 145 mmol/L   Potassium 4.3 3.5 - 5.1 mmol/L   Chloride 100 98 - 111 mmol/L   CO2 29 22 - 32 mmol/L   Glucose, Bld 121 (H) 70 - 99 mg/dL   BUN 11 8 - 23 mg/dL   Creatinine, Ser 6.04 0.61 - 1.24 mg/dL   Calcium 8.8 (L) 8.9 - 10.3 mg/dL   Total Protein 6.5 6.5 - 8.1 g/dL    Albumin 3.4 (L) 3.5 - 5.0 g/dL   AST 31 15 - 41 U/L   ALT 21 0 - 44 U/L   Alkaline Phosphatase 53 38 - 126 U/L   Total Bilirubin 0.8 0.3 - 1.2 mg/dL   GFR calc non Af Amer >60 >60 mL/min   GFR calc Af Amer >60 >60 mL/min   Anion gap 7 5 - 15    Comment: Performed at [facility], [address] [place], [place] [address]  Troponin I - ONCE - STAT     Status: None   Collection Time: 04/19/18  3:37 PM  Result Value Ref Range   Troponin I <0.03 <0.03 ng/mL    Comment: Performed at [facility], [address] [place], [place] [address]  Urinalysis, Complete w Microscopic     Status: Abnormal   Collection Time: 04/19/18  3:37 PM  Result Value Ref Range   Color, Urine AMBER (A) YELLOW    Comment: BIOCHEMICALS MAY BE AFFECTED BY COLOR   APPearance CLOUDY (A) CLEAR   Specific Gravity, Urine 1.025 1.005 - 1.030   pH 5.0 5.0 - 8.0   Glucose, UA NEGATIVE NEGATIVE mg/dL   Hgb urine dipstick NEGATIVE NEGATIVE   Bilirubin Urine NEGATIVE NEGATIVE   Ketones, ur NEGATIVE NEGATIVE mg/dL   Protein, ur 30 (A) NEGATIVE mg/dL   Nitrite NEGATIVE NEGATIVE   Leukocytes,Ua NEGATIVE NEGATIVE   RBC / HPF 0-5 0 - 5 RBC/hpf   WBC, UA 0-5 0 - 5 WBC/hpf   Bacteria, UA NONE SEEN NONE SEEN   Squamous Epithelial / LPF NONE SEEN 0 - 5   Mucus PRESENT    Hyaline Casts, UA PRESENT     Comment: Performed at [facility], [address] [place], [place] [address]  Valproic acid level     Status: None   Collection Time: 04/19/18  4:42 PM  Result Value Ref Range   Valproic Acid Lvl 77 50.0 - 100.0 ug/mL    Comment: Performed at [facility], [address], [place], [place] [address]   Ct Head Wo Contrast  Result Date: 04/19/2018 CLINICAL DATA:  Speech difficulty. Left craniotomy 03/10/2018 for subdural hematoma EXAM: CT HEAD WITHOUT CONTRAST TECHNIQUE: Contiguous axial images were obtained from the base of the skull through the vertex without intravenous contrast.  COMPARISON:  CT head 03/18/2018 FINDINGS: Brain: Moderate to advanced atrophy.  Negative for hydrocephalus. Left-sided subdural fluid collection is predominately low-density  with thickened dura present. Fluid collection measures approximately 7.5 mm in thickness. Previously gas was present in this collection due to surgery. No hyperdense subdural hematoma. Slight midline shift to the right 2 mm. Cortical edema in the left frontal lobe is new since the prior study and is consistent with acute infarct causing speech difficulty. Mild chronic ischemic change in the white matter. Vascular: Negative for hyperdense vessel. Skull: Left frontal craniotomy.  No acute skeletal abnormality. Sinuses/Orbits: Negative Other: None IMPRESSION: Chronic low-density subdural fluid collection on the left. No acute hemorrhage. Mild mass-effect and mild midline shift to the right of 2 mm New hypodensity left frontal cortex compatible with acute infarct causing patient's current symptoms of speech difficulty. These results were called by telephone at the time of interpretation on 04/19/2018 at 4:12 pm to Dr. Rockne Menghini , who verbally acknowledged these results. Electronically Signed   By: Marlan Palau M.D.   On: 04/19/2018 16:13   Mr Brain Wo Contrast  Result Date: 04/19/2018 CLINICAL DATA:  Initial evaluation for acute fatigue E, recent craniotomy for subdural evacuation. EXAM: MRI HEAD WITHOUT CONTRAST TECHNIQUE: Multiplanar, multiecho pulse sequences of the brain and surrounding structures were obtained without intravenous contrast. COMPARISON:  Prior CT from earlier same day as well as earlier studies. FINDINGS: Brain: Chronic residual left subdural collection overlies the left frontotemporal convexity, measuring up to 1 cm in maximal diameter. Mild mass effect on the subjacent left cerebral hemisphere with trace 2 mm left-to-right shift. Focal 2.7 cm area of T2/FLAIR hyperintensity within the subjacent anterior left  frontal lobe corresponds with hyperdensity seen on prior CT (series 9, image 20). No associated significant restricted diffusion. Finding favored to reflect sequelae of subacute ischemia. No associated hemorrhage or Underlying age-related cerebral atrophy. No other acute or subacute ischemic infarct. Gray-white matter differentiation otherwise maintained. No other evidence for acute or chronic intracranial hemorrhage. No other mass lesion.  No hydrocephalus.  Pituitary gland normal. Vascular: Major intracranial vascular flow voids maintained. Skull and upper cervical spine: Craniocervical junction normal. Degenerative spondylolysis noted within the partially visualized upper cervical spine. Bone marrow signal intensity normal. Prior left craniotomy. No scalp soft tissue abnormality. Sinuses/Orbits: Globes and orbital soft tissues within normal limits. Mucosal thickening with superimposed retention cyst noted within the left maxillary sinus. Paranasal sinuses are otherwise clear. No mastoid effusion. Inner ear structures normal. Other: None. IMPRESSION: 1. Focal 2.7 cm area of T2/FLAIR signal abnormality involving the anterior left frontal lobe, corresponding with hypodensity seen on prior CT. Finding is nonspecific, but favored to reflect sequelae of subacute ischemia. Edema from a possible underlying mass would be difficult to exclude, and further assessment with postcontrast imaging suggested for complete evaluation. Irregardless, a short interval follow-up MRI to ensure these changes resolve is recommended. 2. Chronic left subdural collection measuring up to 10 mm in maximal thickness with associated trace 2 mm left-to-right shift. 3. Sequelae of prior left craniotomy. Electronically Signed   By: Rise Mu M.D.   On: 04/19/2018 17:50    Pending Labs [employer] (From admission, onward)    Start     Ordered   Signed and Held  Hemoglobin A1c  Tomorrow morning,   R     Signed and Held   Signed  and Held  Lipid panel  Tomorrow morning,   R    Comments:  Fasting    Signed and Held          Vitals/Pain Today's Vitals   04/19/18 1600 04/19/18 1732  04/19/18 1800 04/19/18 1830  BP: 138/78 (!) 142/76 108/87 132/80  Pulse:  60    Resp:  14 (!) 21 15  Temp:      TempSrc:      SpO2:  98%    Weight:      Height:      PainSc:        Isolation Precautions No active isolations  Medications Medications  zonisamide (ZONEGRAN) capsule 100 mg (has no administration in time range)    Mobility walks with device High fall risk   Focused Assessments Neuro Assessment Handoff:  Swallow screen pass? No    NIH Stroke Scale ( + Modified Stroke Scale Criteria)  Interval: Initial Level of Consciousness (1a.)   : Alert, keenly responsive LOC Questions (1b. )   +: Answers both questions correctly LOC Commands (1c. )   + : Performs both tasks correctly Best Gaze (2. )  +: Normal Visual (3. )  +: No visual loss Facial Palsy (4. )    : Normal symmetrical movements Motor Arm, Left (5a. )   +: No drift Motor Arm, Right (5b. )   +: No drift Motor Leg, Left (6a. )   +: No drift Motor Leg, Right (6b. )   +: No drift Limb Ataxia (7. ): Absent Sensory (8. )   +: Normal, no sensory loss Best Language (9. )   +: Mild-to-moderate aphasia Dysarthria (10. ): Normal Extinction/Inattention (11.)   +: No Abnormality Modified SS Total  +: 1 Complete NIHSS TOTAL: 1     Neuro Assessment: Exceptions to WDL Neuro Checks:   Initial (04/19/18 1545)  Last Documented NIHSS Modified Score: 1 (04/19/18 1545) Has TPA been given? No If patient is a Neuro Trauma and patient is going to OR before floor call report to 4N Charge nurse: [phone] or [phone]     R Recommendations: See Admitting Provider Note  Report given to:   Additional Notes: Pt has expressive aphasia, pt did not pass swallow screen due to not being able to follow command to swallow water, pt vital WNL

## 2018-04-19 NOTE — ED Notes (Signed)
ED Provider at bedside. 

## 2018-04-19 NOTE — ED Triage Notes (Addendum)
Patient arrived by EMS from home for c/o being "tired" Patients family reported a decline in health due to his walking and speech not at baseline since he was released from [facility] in February after craniotomy surgery. Denies pain. HX A-fib. EMS vitals 136 cbg, 99.2oral, 122/68 b/p, HR 62, 98%. EMs reports patient ambulated with walker to stretcher at home. Left hand grip stronger than right. Expressive aphasia present

## 2018-04-19 NOTE — Progress Notes (Signed)
Family Meeting Note  Advance Directive:yes  Today a meeting took place with the Patient.  Patient is able to participate   The following clinical team members were present during this meeting:MD  The following were discussed:Patient's diagnosis: Acute left frontal ischemic cerebrovascular accident, history of recent craniectomy for subdural hematoma, noted continued chronic subdural hematoma on the left with 10 mm of shift with left to right shifting, seizure disorder, chronic A. fib not on oral anticoagulation given history of recent brain surgery/craniectomy, prognosis poor, Patient's progosis: Unable to determine and Goals for treatment: DNR  Additional follow-up to be provided: prn  Time spent during discussion:20 minutes  Alvin Sol, MD

## 2018-04-20 ENCOUNTER — Inpatient Hospital Stay: Payer: Medicare Other

## 2018-04-20 ENCOUNTER — Inpatient Hospital Stay (HOSPITAL_COMMUNITY)
Admit: 2018-04-20 | Discharge: 2018-04-20 | Disposition: A | Discharge status: Home / Self Care | Payer: Medicare Other | Attending: Family Medicine | Admitting: Family Medicine

## 2018-04-20 ENCOUNTER — Ambulatory Visit: Payer: Medicare Other | Admitting: Speech Pathology

## 2018-04-20 ENCOUNTER — Ambulatory Visit: Payer: Medicare Other | Admitting: Occupational Therapy

## 2018-04-20 DIAGNOSIS — R9431 Abnormal electrocardiogram [ECG] [EKG]: Secondary | ICD-10-CM

## 2018-04-20 LAB — LIPID PANEL
CHOLESTEROL: 108 mg/dL (ref 0–200)
HDL: 34 mg/dL — ABNORMAL LOW (ref >40)
LDL Cholesterol: 59 mg/dL (ref 0–99)
TRIGLYCERIDES: 73 mg/dL (ref <150)
Total CHOL/HDL Ratio: 3.2 RATIO
VLDL: 15 mg/dL (ref 0–40)

## 2018-04-20 LAB — ECHOCARDIOGRAM COMPLETE
Height: 66 in
Weight: 2369.6 oz

## 2018-04-20 LAB — HEMOGLOBIN A1C
Hgb A1c MFr Bld: 4.3 % — ABNORMAL LOW (ref 4.8–5.6)
Mean Plasma Glucose: 76.71 mg/dL

## 2018-04-20 MED ORDER — GADOBUTROL 1 MMOL/ML IV SOLN
6.0000 mL | Freq: Once | INTRAVENOUS | Status: AC | PRN
  Administered 2018-04-20: 15:00:00 6 mL via INTRAVENOUS

## 2018-04-20 MED ORDER — APIXABAN 5 MG PO TABS
5.0000 mg | ORAL_TABLET | Freq: Two times a day (BID) | ORAL | Status: DC
  Administered 2018-04-20 – 2018-04-22 (×4): 5 mg via ORAL
  Filled 2018-04-20 (×4): qty 1

## 2018-04-20 NOTE — Progress Notes (Signed)
eeg completed ° °

## 2018-04-20 NOTE — Progress Notes (Signed)
Addendum: MRI of the brain with contrast reviewed.  Have discussed case with Vascular Neurology, Dr. Roda Shutters, who cared for him in February with his initial presentation.  After extensive conversation and review of films it is felt that the new finding on imaging is either related to venous congestion or an infarct that occurred closer to his previous hospitalization.  Patient also with history of afib.  Acute hemorrhage has resolved.  Patient to be started on anticoagulation.    Recommendations: 1.  Eliquis to be started 2.  Would continue Zonegran for now 3.  Patient to follow up with neurology on an outpatient basis.  \  Thana Farr, MD Neurology [phone]

## 2018-04-20 NOTE — Progress Notes (Signed)
OT Cancellation Note  Patient Details Name: Alvin Hudson MRN: [id number] DOB: 12-02-1935   Cancelled Treatment:    Reason Eval/Treat Not Completed: Patient at procedure or test/ unavailable. Pt continues to be out of the room. Will re-attempt OT evaluation at later date/time as pt is available and medically appropriate.   Richrd Prime, MPH, MS, OTR/L ascom [phone] 04/20/18, 2:30 PM

## 2018-04-20 NOTE — Progress Notes (Signed)
PT Cancellation Note  Patient Details Name: Alvin Hudson MRN: [id number] DOB: 04/06/1935   Cancelled Treatment:    Reason Eval/Treat Not Completed: Patient at procedure or test/unavailable;Other (comment)(Attempted PT evaluation unable to perform because patient is away at procedure)  Myrene Galas, PT DPT 04/20/18, 8:56 AM

## 2018-04-20 NOTE — Progress Notes (Signed)
SLP Cancellation Note  Patient Details Name: Alvin Hudson MRN: [id number] DOB: [birth year]   Cancelled treatment:       Reason Eval/Treat Not Completed: Patient at procedure or test/unavailable. Chart reviewed. Nsg consulted. Patient currently out of room for carotid doppler procedure at Ultrasound, will re-attempt later today as time permits. Noted pt NPO. Nsg reports pt failed Yale swallow screen, no s/s aspiration noted however pt did not follow directions to drink full amount continuously. Nsg reported she will discuss possible order for BSE with  MD for orders when he rounds.  Rees Matura, MA, CCC-SLP 04/20/2018, 9:08 AM

## 2018-04-20 NOTE — TOC Initial Note (Signed)
Transition of Care [facility]) - Initial/Assessment Note    Patient Details  Name: Alvin Hudson MRN: [id number] Date of Birth: 07-Dec-1935  Transition of Care [facility]) CM/SW Contact:    Gwenette Greet, RN Phone Number: 04/20/2018, 9:54 AM  Clinical Narrative:   Admitted to [facility] with the diagnosis of CVA and post fall. Lives with wife, [name] [phone]). Sees Dr. Tera Helper as primary care physician.  Discharged from [facility] 04/11/18.  Discharge plan was to go to Outpatient Inpatient physical therapy at this facility.                 Expected Discharge Plan: (continued medical work up) Barriers to Discharge: Continued Medical Work up   Patient Goals and CMS Choice Patient states their goals for this hospitalization and ongoing recovery are:: (Would like to go home when stable) CMS Medicare.gov Compare Post Acute Care list provided to:: Patient Represenative (must comment)(wife) Choice offered to / list presented to : NA  Expected Discharge Plan and Services Expected Discharge Plan: (continued medical work up) In-house Referral: (continued medical work up) Discharge Planning Services: CM Consult Post Acute Care Choice: NA Living arrangements for the past 2 months: Single Family Home                 DME Arranged: Walker rolling, Tub bench(Discharged from [facility] 04/11/18) DME Agency: [facility] HH Arranged: NA HH Agency: [facility])  Prior Living Arrangements/Services Living arrangements for the past 2 months: Single Family Home   Patient language and need for interpreter reviewed:: No Do you feel safe going back to the place where you live?: Yes      Need for Family Participation in Patient Care: Yes (Comment) Care giver support system in place?: Yes (comment) Current home services: (Outpatient physical therapy) Criminal Activity/Legal Involvement Pertinent to Current Situation/Hospitalization: No - Comment as needed  Activities of  Daily Living Unable to express his needs at this time      Permission Sought/Granted Permission sought to share information with : Case Manager Permission granted to share information with : Yes, Verbal Permission Granted              Emotional Assessment Appearance:: Appears stated age Attitude/Demeanor/Rapport: (Unable to speak in complete sentences) Affect (typically observed): Accepting, Adaptable Orientation: : Oriented to Self Alcohol / Substance Use: Not Applicable Psych Involvement: No (comment)  Admission diagnosis:  Frontal lobe and executive function deficit following cerebral infarction [I69.314] Patient Active Problem List   Diagnosis Date Noted  . CVA (cerebral vascular accident) (HCC) 04/19/2018  . Anemia of chronic disease   . Megaloblastic anemia   . Hypoalbuminemia due to protein-calorie malnutrition (HCC)   . Leukocytosis   . New onset atrial fibrillation (HCC)   . Seizures (HCC)   . 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 History of subdural hematoma   . Dysphagia   . Atrial fibrillation (HCC) 03/17/2018  . Aphasia 03/12/2018  . Seizure (HCC) 03/12/2018  . Subdural hematoma (HCC) 03/10/2018   PCP:  Cain Sieve, MD Pharmacy:   [facility] [place], [place] - [address] [place] [address] Phone: [phone] Fax: [phone]     Social Determinants of Health (SDOH) Interventions Lives with wife.    Readmission Risk Interventions No flowsheet data found.

## 2018-04-20 NOTE — Evaluation (Signed)
Physical Therapy Evaluation Patient Details Name: Alvin Hudson MRN: [id number] DOB: 1935-10-28 Today's Date: 04/20/2018   History of Present Illness  83 y.o. male with a known history to include craniectomy for left subdural hematoma evacuation in February 2020, seizure disorder, history of A. fib, , high-grade left M2 stenosis, presenting with left arm weakness, problems with speech, speech arrest, falls, difficulty ambulating, generalized weakness, fatigue. Work up showed left frontal subdural hematoma, acute L frontal CVA.    Clinical Impression  Patient alert, in bed, oriented to self at start of session, did not report pain. Per chart review, pt was independent. Recently discharged from inpatient rehab, pt having difficulty verbalzing level of assistance needed for ADLs/IADLs.   Patient exhibited R > L strength deficits, inconsistently followed one step commands, improvement noted with extended time and simplicity. Demonstrated bed mobility with supervision, transferred with RW and CGA, and ambulated ~174ft with RW and CGA. Pt exhibited short, shuffling steps and mild impulsivity during ambulation. Pt needed intermittent verbal cues to improve gait mechanics, stride length, and to attend to task.  Overall the patient demonstrated deficits (see "PT Problem List") that impede the patient's functional abilities, safety, and mobility and would benefit from skilled PT intervention. Recommendation is outpatient therapy and supervision 24/7 to ensure safety.     Follow Up Recommendations Outpatient PT;Supervision/Assistance - 24 hour    Equipment Recommendations  None recommended by PT(Pt reported have RW at home)    Recommendations for Other Services       Precautions / Restrictions Precautions Precautions: Fall Restrictions Weight Bearing Restrictions: No      Mobility  Bed Mobility Overal bed mobility: Needs Assistance Bed Mobility: Supine to Sit     Supine to sit: Supervision        Transfers Overall transfer level: Needs assistance Equipment used: Rolling walker (2 wheeled) Transfers: Sit to/from Stand Sit to Stand: Min guard            Ambulation/Gait Ambulation/Gait assistance: Min guard Gait Distance (Feet): 180 Feet Assistive device: Rolling walker (2 wheeled)   Gait velocity: decreased   General Gait Details: Presented with shuffling step, short strides, tended to ambulate with toes off the ground. Improvement in stride length and stability with verbal cues  Stairs            Wheelchair Mobility    Modified Rankin (Stroke Patients Only) Modified Rankin (Stroke Patients Only) Pre-Morbid Rankin Score: Moderately severe disability Modified Rankin: Moderately severe disability     Balance Overall balance assessment: Needs assistance Sitting-balance support: Feet supported Sitting balance-Leahy Scale: Good       Standing balance-Leahy Scale: Fair                               Pertinent Vitals/Pain Pain Assessment: No/denies pain    Home Living Family/patient expects to be discharged to:: Private residence Living Arrangements: Spouse/significant other Available Help at Discharge: Family;Available 24 hours/day Type of Home: House Home Access: Stairs to enter Entrance Stairs-Rails: Lawyer of Steps: 4 Home Layout: Two level;Able to live on main level with bedroom/bathroom Home Equipment: Dan Humphreys - 2 wheels      Prior Function           Comments: Per chart review, pt was independent. Recently discharged from inpatient rehab, pt having difficulty verbalzing level of assistance needed for ADLs/IADLs     Hand Dominance   Dominant Hand: Right  Extremity/Trunk Assessment   Upper Extremity Assessment Upper Extremity Assessment: RUE deficits/detail;LUE deficits/detail RUE Deficits / Details: grossly 4-/5. Impaired ability to report changes in light touch sensation LUE Deficits /  Details: grossly 4/5 impaired ability to report changes in light touch sensation    Lower Extremity Assessment Lower Extremity Assessment: RLE deficits/detail;LLE deficits/detail RLE Deficits / Details: hip flexion 3/5, remaining LE 4-/5 LLE Deficits / Details: grossly 4/5       Communication   Communication: Expressive difficulties;HOH  Cognition Arousal/Alertness: Awake/alert Behavior During Therapy: WFL for tasks assessed/performed Overall Cognitive Status: No family/caregiver present to determine baseline cognitive functioning Area of Impairment: Memory;Following commands;Problem solving;Safety/judgement                     Memory: Decreased short-term memory Following Commands: Follows one step commands inconsistently Safety/Judgement: Decreased awareness of safety   Problem Solving: Slow processing;Difficulty sequencing;Requires verbal cues;Requires tactile cues        General Comments      Exercises     Assessment/Plan    PT Assessment Patient needs continued PT services  PT Problem List Decreased strength;Decreased coordination;Decreased range of motion;Decreased activity tolerance;Decreased knowledge of use of DME;Decreased balance;Decreased safety awareness;Decreased mobility;Decreased knowledge of precautions       PT Treatment Interventions DME instruction;Therapeutic exercise;Gait training;Balance training;Stair training;Neuromuscular re-education;Functional mobility training;Therapeutic activities;Patient/family education    PT Goals (Current goals can be found in the Care Plan section)  Acute Rehab PT Goals Patient Stated Goal: to mobilize PT Goal Formulation: With patient Time For Goal Achievement: 05/04/18 Potential to Achieve Goals: Good    Frequency 7X/week   Barriers to discharge        Co-evaluation               AM-PAC PT "6 Clicks" Mobility  Outcome Measure Help needed turning from your back to your side while in a flat  bed without using bedrails?: A Little Help needed moving from lying on your back to sitting on the side of a flat bed without using bedrails?: A Little Help needed moving to and from a bed to a chair (including a wheelchair)?: A Little Help needed standing up from a chair using your arms (e.g., wheelchair or bedside chair)?: A Little Help needed to walk in hospital room?: A Little Help needed climbing 3-5 steps with a railing? : A Lot 6 Click Score: 17    End of Session Equipment Utilized During Treatment: Gait belt Activity Tolerance: Patient tolerated treatment well Patient left: in chair;with nursing/sitter in room;with chair alarm set;with call bell/phone within reach Nurse Communication: Mobility status PT Visit Diagnosis: Other abnormalities of gait and mobility (R26.89);Muscle weakness (generalized) (M62.81);Difficulty in walking, not elsewhere classified (R26.2)    Time: 5456-2563 PT Time Calculation (min) (ACUTE ONLY): 32 min   Charges:   PT Evaluation $PT Eval Moderate Complexity: 1 Mod PT Treatments $Therapeutic Activity: 8-22 mins        Olga Coaster PT, DPT 1:18 PM,04/20/18 [phone]

## 2018-04-20 NOTE — Procedures (Addendum)
ELECTROENCEPHALOGRAM REPORT   Patient: Alvin Hudson       Room #: 108A-AA EEG No. ID: [id number] Age: 83 y.o.        Sex: male Referring Physician: Sudini Report Date:  04/20/2018        Interpreting Physician: Thana Farr  History: Alvin Hudson is an 83 y.o. male with intermittent aphasia  Medications:  Lipitor, Cardizem, Depakote, Colace, Vimpat, Keppra, Claritin, MVI, Zonegran  Conditions of Recording:  This is a 21 channel routine scalp EEG performed with bipolar and monopolar montages arranged in accordance to the international 10/20 system of electrode placement. One channel was dedicated to EKG recording.  The patient is in the awake state.  Description:  The background activity is slow and poorly organized with alpha activity being rare and theta and delta rhythms being more prominent.  Over the left hemisphere the background rhythm is further slowed by a persistent underlying polymorphic delta activity.  Also noted on occasion over th left hemisphere is left temporal sharp waves with phase reversal at T3.   The patient drowses with slowing to irregular, low voltage theta and beta activity.  There is no evidence of stage II sleep.   Hyperventilation was not performed. Intermittent photic stimulation was performed but failed to illicit any change in the tracing.    IMPRESSION: This is an abnormal EEG secondary to general background slowing.  This finding may be seen with a diffuse disturbance that is etiologically nonspecific, but may include a metabolic encephalopathy, among other possibilities.  Also noted is superimposed further left hemispheric focal slowing and left temporal sharp waves that are consistent with the patients history of a focal left hemispheric lesion and seizures.    Thana Farr, MD Neurology [phone] 04/20/2018, 2:32 PM

## 2018-04-20 NOTE — TOC Initial Note (Deleted)
Transition of Care [facility]) - Initial/Assessment Note    Patient Details  Name: Alvin Hudson MRN: [id number] Date of Birth: 08/01/1935  [facility] of [facility]) CM/SW Contact:    Gwenette Greet, RN Phone Number: 04/20/2018, 9:42 AM  Clinical Narrative:   Admitted to [facility] with the diagnosis of CVA and post fall. . Lives with wife, Alvin Hudson [phone]). Sees Dr. Tera Helper as primary care physician. Discharged from Inpatient Acute Rehabilitation at [facility] 3/17.20.                        Patient Goals and CMS Choice        Expected Discharge Plan and Services                                    Prior Living Arrangements/Services                       Activities of Daily Living      Permission Sought/Granted                  Emotional Assessment              Admission diagnosis:  Frontal lobe and executive function deficit following cerebral infarction [I69.314] Patient Active Problem List   Diagnosis Date Noted  . CVA (cerebral vascular accident) (HCC) 04/19/2018  . Anemia of chronic disease   . Megaloblastic anemia   . Hypoalbuminemia due to protein-calorie malnutrition (HCC)   . Leukocytosis   . New onset atrial fibrillation (HCC)   . Seizures (HCC)   . 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 History of subdural hematoma   . Dysphagia   . Atrial fibrillation (HCC) 03/17/2018  . Aphasia 03/12/2018  . Seizure (HCC) 03/12/2018  . Subdural hematoma (HCC) 03/10/2018   PCP:  Cain Sieve, MD Pharmacy:   [facility] [place], [place] - [address] [place] [address] Phone: [phone] Fax: [phone]     Social Determinants of Health (SDOH) Interventions    Readmission Risk Interventions No flowsheet data found.

## 2018-04-20 NOTE — Progress Notes (Signed)
Inpatient Rehabilitation Admissions Coordinator  Patient recent d/c from [facility] inpt rehab/CIR 04/11/2018 with outpt therapy arranged at [facility]. Pt was at supervision level with therapy. Admitted to CIR  03/20/2018 after he underwent left frontotemporal parietal craniotomy for evacuation of subdural hematoma 03/10/2018 by Dr. Tressie Stalker. I will follow his progress to assist with planning dispo if needed for post acute rehab needs.   Ottie Glazier, RN, MSN Rehab Admissions Coordinator [phone] 04/20/2018 11:07 AM

## 2018-04-20 NOTE — Progress Notes (Signed)
*  PRELIMINARY RESULTS* Echocardiogram 2D Echocardiogram has been performed.  Cristela Blue 04/20/2018, 8:56 AM

## 2018-04-20 NOTE — Progress Notes (Addendum)
Subjective: No worsening symptoms of aphasia or right side weakness reported. Patient remains aphasic and noted to have some difficulty with expression/word finding. Denies any new concerns.  Objective: Current vital signs: BP 121/72 (BP Location: Right Arm)   Pulse (!) 56   Temp 98.3 F (36.8 C) (Oral)   Resp 20   Ht 5\' 6"  (1.676 m)   Wt 67.2 kg   SpO2 100%   BMI 23.90 kg/m  Vital signs in last 24 hours: Temp:  [98 F (36.7 C)-99 F (37.2 C)] 98.3 F (36.8 C) (03/26 0908) Pulse Rate:  [56-68] 56 (03/26 0908) Resp:  [14-21] 20 (03/26 0908) BP: (108-173)/(60-92) 121/72 (03/26 0908) SpO2:  [97 %-100 %] 100 % (03/26 0908) Weight:  [66.4 kg-67.2 kg] 67.2 kg (03/25 2033)  Intake/Output from previous day: 03/25 0701 - 03/26 0700 In: 275.7 [I.V.:275.7] Out: -  Intake/Output this shift: No intake/output data recorded. Nutritional status:  Diet Order            Diet regular Room service appropriate? Yes; Fluid consistency: Thin  Diet effective now             Neurological Exam  Mental Status: Alert.  Patient initially fluent but with further conversation became significantly aphasic. Able to follow some commands but a times required prompting and cueing.  Cranial Nerves: II: Blinks to bilateral confrontation, pupils equal, round, reactive to light and accommodation III,IV, VI: ptosis not present, extra-ocular motions intact bilaterally V,VII: smile symmetric, facial light touch sensationintact VIII: hearing normal bilaterally IX,X: gag reflex present XI: bilateral shoulder shrug XII: midline tongue extension Motor: 5/5 throughout with no focal weakness noted Sensory: Pinprick and light touchintact bilaterally Deep Tendon Reflexes: 1+ and symmetric with absent AJ's bilaterally Plantars: Right:muteLeft: mute Cerebellar: Finger-to-nosetesting intact bilaterally.Heel to shin testing normal bilaterally Gait: not tested due to safety  concerns  Data Reviewed  Lab Results: Basic Metabolic Panel: Recent Labs  Lab 04/19/18 1537  NA 136  K 4.3  CL 100  CO2 29  GLUCOSE 121*  BUN 11  CREATININE 0.83  CALCIUM 8.8*    Liver Function Tests: Recent Labs  Lab 04/19/18 1537  AST 31  ALT 21  ALKPHOS 53  BILITOT 0.8  PROT 6.5  ALBUMIN 3.4*   No results for input(s): LIPASE, AMYLASE in the last 168 hours. No results for input(s): AMMONIA in the last 168 hours.  CBC: Recent Labs  Lab 04/19/18 1537  WBC 6.9  NEUTROABS 3.7  HGB 12.2*  HCT 38.0*  MCV 111.1*  PLT 162    Cardiac Enzymes: Recent Labs  Lab 04/19/18 1537  TROPONINI <0.03    Lipid Panel: Recent Labs  Lab 04/20/18 0337  CHOL 108  TRIG 73  HDL 34*  CHOLHDL 3.2  VLDL 15  LDLCALC 59    CBG: No results for input(s): GLUCAP in the last 168 hours.  Microbiology: Results for orders placed or performed during the hospital encounter of 03/12/18  Culture, blood (Routine X 2) w Reflex to ID Panel     Status: None   Collection Time: 03/13/18  2:35 PM  Result Value Ref Range Status   Specimen Description BLOOD RIGHT ANTECUBITAL  Final   Special Requests   Final    BOTTLES DRAWN AEROBIC AND ANAEROBIC Blood Culture adequate volume Performed at [facility], [address] [place], [place] [address]    Culture NO GROWTH 5 DAYS  Final   Report Status 03/18/2018 FINAL  Final  Culture,  blood (Routine X 2) w Reflex to ID Panel     Status: None   Collection Time: 03/13/18  2:45 PM  Result Value Ref Range Status   Specimen Description BLOOD RIGHT HAND  Final   Special Requests   Final    BOTTLES DRAWN AEROBIC ONLY Blood Culture results may not be optimal due to an inadequate volume of blood received in culture bottles Performed at [facility], [address] [place], [place] [address]    Culture NO GROWTH 5 DAYS  Final   Report Status 03/18/2018 FINAL  Final    Coagulation Studies: No results for input(s): LABPROT, INR in  the last 72 hours.  Imaging: Ct Head Wo Contrast  Result Date: 04/19/2018 CLINICAL DATA:  Speech difficulty. Left craniotomy 03/10/2018 for subdural hematoma EXAM: CT HEAD WITHOUT CONTRAST TECHNIQUE: Contiguous axial images were obtained from the base of the skull through the vertex without intravenous contrast. COMPARISON:  CT head 03/18/2018 FINDINGS: Brain: Moderate to advanced atrophy.  Negative for hydrocephalus. Left-sided subdural fluid collection is predominately low-density with thickened dura present. Fluid collection measures approximately 7.5 mm in thickness. Previously gas was present in this collection due to surgery. No hyperdense subdural hematoma. Slight midline shift to the right 2 mm. Cortical edema in the left frontal lobe is new since the prior study and is consistent with acute infarct causing speech difficulty. Mild chronic ischemic change in the white matter. Vascular: Negative for hyperdense vessel. Skull: Left frontal craniotomy.  No acute skeletal abnormality. Sinuses/Orbits: Negative Other: None IMPRESSION: Chronic low-density subdural fluid collection on the left. No acute hemorrhage. Mild mass-effect and mild midline shift to the right of 2 mm New hypodensity left frontal cortex compatible with acute infarct causing patient's current symptoms of speech difficulty. These results were called by telephone at the time of interpretation on 04/19/2018 at 4:12 pm to Dr. Rockne Menghini , who verbally acknowledged these results. Electronically Signed   By: Marlan Palau M.D.   On: 04/19/2018 16:13   Mr Brain Wo Contrast  Result Date: 04/19/2018 CLINICAL DATA:  Initial evaluation for acute fatigue E, recent craniotomy for subdural evacuation. EXAM: MRI HEAD WITHOUT CONTRAST TECHNIQUE: Multiplanar, multiecho pulse sequences of the brain and surrounding structures were obtained without intravenous contrast. COMPARISON:  Prior CT from earlier same day as well as earlier studies.  FINDINGS: Brain: Chronic residual left subdural collection overlies the left frontotemporal convexity, measuring up to 1 cm in maximal diameter. Mild mass effect on the subjacent left cerebral hemisphere with trace 2 mm left-to-right shift. Focal 2.7 cm area of T2/FLAIR hyperintensity within the subjacent anterior left frontal lobe corresponds with hyperdensity seen on prior CT (series 9, image 20). No associated significant restricted diffusion. Finding favored to reflect sequelae of subacute ischemia. No associated hemorrhage or Underlying age-related cerebral atrophy. No other acute or subacute ischemic infarct. Gray-white matter differentiation otherwise maintained. No other evidence for acute or chronic intracranial hemorrhage. No other mass lesion.  No hydrocephalus.  Pituitary gland normal. Vascular: Major intracranial vascular flow voids maintained. Skull and upper cervical spine: Craniocervical junction normal. Degenerative spondylolysis noted within the partially visualized upper cervical spine. Bone marrow signal intensity normal. Prior left craniotomy. No scalp soft tissue abnormality. Sinuses/Orbits: Globes and orbital soft tissues within normal limits. Mucosal thickening with superimposed retention cyst noted within the left maxillary sinus. Paranasal sinuses are otherwise clear. No mastoid effusion. Inner ear structures normal. Other: None. IMPRESSION: 1. Focal 2.7 cm area of T2/FLAIR signal  abnormality involving the anterior left frontal lobe, corresponding with hypodensity seen on prior CT. Finding is nonspecific, but favored to reflect sequelae of subacute ischemia. Edema from a possible underlying mass would be difficult to exclude, and further assessment with postcontrast imaging suggested for complete evaluation. Irregardless, a short interval follow-up MRI to ensure these changes resolve is recommended. 2. Chronic left subdural collection measuring up to 10 mm in maximal thickness with  associated trace 2 mm left-to-right shift. 3. Sequelae of prior left craniotomy. Electronically Signed   By: Rise MuBenjamin  McClintock M.D.   On: 04/19/2018 17:50   [place] Carotid Bilateral (at [facility] Only)  Result Date: 04/20/2018 CLINICAL DATA:  83 year old male with a history of cerebrovascular accident EXAM: BILATERAL CAROTID DUPLEX ULTRASOUND TECHNIQUE: Wallace CullensGray scale imaging, color Doppler and duplex ultrasound were performed of bilateral carotid and vertebral arteries in the neck. COMPARISON:  No prior duplex FINDINGS: Criteria: Quantification of carotid stenosis is based on velocity parameters that correlate the residual internal carotid diameter with NASCET-based stenosis levels, using the diameter of the distal internal carotid lumen as the denominator for stenosis measurement. The following velocity measurements were obtained: RIGHT ICA:  Systolic 63 cm/sec, Diastolic 14 cm/sec CCA:  53 cm/sec SYSTOLIC ICA/CCA RATIO:  1.2 ECA:  78 cm/sec LEFT ICA:  Systolic 62 cm/sec, Diastolic 15 cm/sec CCA:  88 cm/sec SYSTOLIC ICA/CCA RATIO:  0.7 ECA:  82 cm/sec Right Brachial SBP: Not acquired Left Brachial SBP: Not acquired RIGHT CAROTID ARTERY: No significant calcified disease of the right common carotid artery. Intermediate waveform maintained. Heterogeneous plaque without significant calcifications at the right carotid bifurcation. Low resistance waveform of the right ICA. No significant tortuosity. RIGHT VERTEBRAL ARTERY: Antegrade flow with low resistance waveform. LEFT CAROTID ARTERY: No significant calcified disease of the left common carotid artery. Intermediate waveform maintained. Heterogeneous plaque at the left carotid bifurcation without significant calcifications. Low resistance waveform of the left ICA. LEFT VERTEBRAL ARTERY:  Antegrade flow with low resistance waveform. IMPRESSION: Color duplex indicates minimal heterogeneous plaque, with no hemodynamically significant stenosis by duplex criteria in the  extracranial cerebrovascular circulation. Signed, Yvone NeuJaime S. Reyne DumasWagner, DO, RPVI [facility] Electronically Signed   By: Gilmer MorJaime  Wagner D.O.   On: 04/20/2018 08:42   Dg Chest Port 1 View  Result Date: 04/19/2018 CLINICAL DATA:  CVA. EXAM: PORTABLE CHEST 1 VIEW COMPARISON:  03/19/2018 FINDINGS: Mild cardiomegaly improved from prior. Unchanged mediastinal contours. Improved bibasilar aeration with resolved pleural effusion and bibasilar opacities. No acute airspace disease. No pneumothorax or pulmonary edema. IMPRESSION: 1. No acute chest findings. 2. Improved lung aeration from February. Improvement in cardiomegaly with mild residual. Electronically Signed   By: Narda RutherfordMelanie  Sanford M.D.   On: 04/19/2018 22:01    Medications:  I have reviewed the patient's current medications. Prior to Admission:  Medications Prior to Admission  Medication Sig Dispense Refill Last Dose  . polycarbophil (FIBERCON) 625 MG tablet Take 625 mg by mouth daily.   prior to previous admission  . acetaminophen (TYLENOL) 325 MG tablet Take 2 tablets (650 mg total) by mouth every 4 (four) hours as needed for mild pain (temp > 100.5).     Marland Kitchen. atorvastatin (LIPITOR) 20 MG tablet Take 1 tablet (20 mg total) by mouth daily. 30 tablet 0   . B Complex-C-Folic Acid (B COMPLEX-VITAMIN C-FOLIC ACID) 1 MG tablet Take 1 tablet by mouth daily.   prior to previous admission  . diltiazem (CARDIZEM CD) 240 MG 24 hr  capsule Take 1 capsule (240 mg total) by mouth daily. 30 capsule 1   . divalproex (DEPAKOTE) 500 MG DR tablet Take 1 tablet (500 mg total) by mouth every 8 (eight) hours. 90 tablet 0   . docusate sodium (COLACE) 100 MG capsule Take 1 capsule (100 mg total) by mouth 2 (two) times daily. 60 capsule 0 prior to previous admission  . HYDROcodone-acetaminophen (NORCO/VICODIN) 5-325 MG tablet Take 1 tablet by mouth every 4 (four) hours as needed for moderate pain. 20 tablet 0   . lacosamide  (VIMPAT) 200 MG TABS tablet Take 1 tablet (200 mg total) by mouth 2 (two) times daily. 60 tablet 1   . levETIRAcetam (KEPPRA) 100 MG/ML solution Take 15 mLs (1,500 mg total) by mouth 2 (two) times daily. 473 mL 12   . loratadine (CLARITIN) 10 MG tablet Take 1 tablet (10 mg total) by mouth daily. 30 tablet 0   . polyethylene glycol (MIRALAX / GLYCOLAX) packet Take 17 g by mouth daily. 14 each 0   . senna-docusate (SENOKOT-S) 8.6-50 MG tablet Take 2 tablets by mouth at bedtime.     . sertraline (ZOLOFT) 100 MG tablet Take 1 tablet (100 mg total) by mouth daily. 30 tablet 0    Scheduled: . atorvastatin  40 mg Oral Daily  . diltiazem  240 mg Oral Daily  . divalproex  500 mg Oral Q8H  . docusate sodium  100 mg Oral BID  . lacosamide  200 mg Oral BID  . levETIRAcetam  1,500 mg Oral BID  . loratadine  10 mg Oral Daily  . multivitamin  1 tablet Oral Daily  . polycarbophil  625 mg Oral Daily  . polyethylene glycol  17 g Oral Daily  . senna-docusate  2 tablet Oral QHS  . sertraline  100 mg Oral Daily  . zonisamide  100 mg Oral Daily   Patient seen and examined.  Clinical course and management discussed.  Necessary edits performed.  I agree with the above.  Assessment and plan of care developed and discussed below.    Assessment: 83 y.o. male with past medical history of seizure on Depakote, Keppra and Vimpat following with neurology Dr. Rulon Eisenmenger at [facility], A. fib with RVR not on anticoagulation, SDH s/p evacuation.  After speaking with the wife it seems that there is no significant change in the patient since discharge in February but she was concerned that her outpatient follow up was not appropriate.  Despite this the patient does have a new finding on MRI.  Unclear etiology.  Patient on three anticonvulsants-Vimpat, Depakote and Keppra.  Zonegran added due to concern that fluctuating symptoms may represent partial seizure activity.  Patient has remained stable.   Carotid dopplers show no  evidence of hemodynamically significant stenosis.  Echocardiogram shows no cardiac source of emboli with an EF of 50-55%.  A1c 4.3.  LDL 59.    Plan: 1. Will repeat MRI of the brain with contrast  2. Will not start anti-platelet or anticoagulation due to history of subdural hematoma and recent surgery 3. EEGpending 4. Seizure precautions 5. Ativan prn seizure activity 6.Continue current anticonvulsant therapy at home doses 7. Continue Zonegran 100mg  daily 8. PT consult, OT consult, Speech consult 9. Telemetry monitoring 1o. Frequent neuro checks  This patient was staffed with Dr. Verlon Au, Thad Ranger who personally evaluated patient, reviewed documentation and agreed with assessment and plan of care as above.  Webb Silversmith, DNP, FNP-BC Board certified Nurse Practitioner Neurology Department  LOS: 1 day   04/20/2018  11:36 AM  Thana Farr, MD Neurology [phone]  04/20/2018  12:44 PM

## 2018-04-20 NOTE — Progress Notes (Signed)
Pt is wearing brief from home, refuses removal. Offered condom cath. Pt continued to refused. Pt educated to purpose of removal of brief and risk for infection and skin break down. Refusal stands.

## 2018-04-20 NOTE — Progress Notes (Signed)
OT Cancellation Note  Patient Details Name: Alvin Hudson MRN: [id number] DOB: May 20, 1935   Cancelled Treatment:    Reason Eval/Treat Not Completed: Patient at procedure or test/ unavailable. Order received, chart reviewed. Pt out of room for testing. Will re-attempt OT evaluation at later date/time as pt is available and medically appropriate.  Richrd Prime, MPH, MS, OTR/L ascom [phone] 04/20/18, 1:34 PM

## 2018-04-20 NOTE — Evaluation (Signed)
Clinical/Bedside Swallow Evaluation Patient Details  Name: Alvin Hudson MRN: [id number] Date of Birth: 1935/04/05  Today's Date: 04/20/2018 Time: SLP Start Time (ACUTE ONLY): 1015 SLP Stop Time (ACUTE ONLY): 1103 SLP Time Calculation (min) (ACUTE ONLY): 48 min  Past Medical History:  Past Medical History:  Diagnosis Date  . Allergy   . Seizures (HCC)    Past Surgical History:  Past Surgical History:  Procedure Laterality Date  . CRANIOTOMY Left 03/10/2018   Procedure: CRANIOTOMY HEMATOMA EVACUATION SUBDURAL;  Surgeon: Tressie Stalker, MD;  Location: [facility] OR;  Service: Neurosurgery;  Laterality: Left;  . INGUINAL HERNIA REPAIR Bilateral    HPI:  Per admitting H&P: Alvin Hudson  is a 83 y.o. male with a known history per below which includes recent craniectomy for left subdural hematoma evacuation in February 2020, seizure disorder, history of A. fib not on oral anticoagulation, high-grade left M2 stenosis, presenting with left arm weakness, problems with speech, speech arrest, falls, difficulty ambulating,, generalized weakness, fatigue, in the emergency room patient was noted to have left frontal lobe 2.7 cm CVA/chronic subdural hematoma on the left with 10 mm of shift from left to right on MRI of the brain, neurology did see patient while in house-no anticoagulation was recommended, hospitalist asked to admit for further evaluation/care, patient evaluated in the emergency room noted to have dysarthria/slow mentation, mild confusion, poor historian, no family at the bedside, patient is now been admitted for acute left frontal ischemic cerebrovascular accident.   Assessment / Plan / Recommendation Clinical Impression  Upon entering pt's room, SLP found pt on EOB past rails attempting to scoot to further and further off EOB and requesting the urinal. Following use of the urinal, pt able to scoot posterioraly in back to midline in bed independently. Reinforced necessity to notify nsg before moving  to EOB to use urinal for safety. Pt easily followed 1 step directions to complete tasks for BSE. Patient appears to present with functional swallowing abilities at bedside. No overt s/s aspiration observed with any consistency tested. Oral phase and oral mech exam WFL. Adequate oral prep/coordination and A-P transit time with all consistencies tested (thin, puree, solid). No oral residue observed. No overt s/s pharyngeal dysphagia observed. Swallow initiation appeared timely. Vocal quality remained clear thoughout evaluation. Pt denies any hx of dysphagia and/or any s/s aspiration with regular diet consumed at home. Educated pt re: aspiration precautions and general safe swallow recommendations. Pt stated agreement, but could not teach same back without max cues due to noted expressive aphasia. Recommend regular diet with thin liquids, recommend no straws at this time, may give meds whole with thin liquid. Discussed results of evaluation with nursing, nursing in agreement.   It is of note, pt's last hospitalization and subsequent inpatient rehab stay at [facility] (2/24-3/17/2020) was s/p left frontotemporal parietal craniotomy for evacuation of subdural hematoma 03/10/2018, pt was d/c'd on a Regular diet with thin liquids. Pt also received tx for remediation and management of expressive aphasia, had made significant gains in tx, and able to use strategies to aid word finding difficulties. He was d/c'd with orders for outpatient SLP eval & treat.   SLP to f/u with toleration of diet and cognitive-linguistic evaluation due to mod receptive/expressive aphasia. Noted marked word finding difficulty, verbalizations included both phonemic and semantic paraphasias at the word level. Noted intermittent ability to self-correct and inconsistent self awareness of word finding difficulty. Spontaneous speech is 100% intelligible, composed of dysfluent meaningful phrases. Pt able to answer simple and  complex y/n questions with  accuracy. Word finding difficulty most evident in response to wh-questions. Pt able to follow simple commands;however noted difficulty with auditory comprehension of more complex information, ie how to dial the phone to reach his wife. Full speech-language evaluation to follow next visit. SLP Visit Diagnosis: Dysphagia, unspecified (R13.10)    Aspiration Risk  Mild aspiration risk    Diet Recommendation Regular;Thin liquid   Liquid Administration via: Cup;No straw Medication Administration: Whole meds with puree Supervision: Staff to assist with self feeding Compensations: Minimize environmental distractions;Slow rate;Small sips/bites Postural Changes: Seated upright at 90 degrees    Other  Recommendations Oral Care Recommendations: Oral care BID   Follow up Recommendations Outpatient SLP;Skilled Nursing facility      Frequency and Duration min 1 x/week  2 weeks       Prognosis Prognosis for Safe Diet Advancement: Good Barriers to Reach Goals: Language deficits      Swallow Study   General Date of Onset: 04/19/18 HPI: Per admitting H&P: Alvin Hudson  is a 83 y.o. male with a known history per below which includes recent craniectomy for left subdural hematoma evacuation in February 2020, seizure disorder, history of A. fib not on oral anticoagulation, high-grade left M2 stenosis, presenting with left arm weakness, problems with speech, speech arrest, falls, difficulty ambulating,, generalized weakness, fatigue, in the emergency room patient was noted to have left frontal lobe 2.7 cm CVA/chronic subdural hematoma on the left with 10 mm of shift from left to right on MRI of the brain, neurology did see patient while in house-no anticoagulation was recommended, hospitalist asked to admit for further evaluation/care, patient evaluated in the emergency room noted to have dysarthria/slow mentation, mild confusion, poor historian, no family at the bedside, patient is now been admitted for acute  left frontal ischemic cerebrovascular accident. Type of Study: Bedside Swallow Evaluation Diet Prior to this Study: NPO Temperature Spikes Noted: No Respiratory Status: Room air History of Recent Intubation: No Behavior/Cognition: Alert;Cooperative;Pleasant mood Oral Cavity Assessment: Within Functional Limits Oral Cavity - Dentition: Adequate natural dentition Vision: Functional for self-feeding Self-Feeding Abilities: Needs assist;Needs set up Patient Positioning: Upright in bed Baseline Vocal Quality: Normal Volitional Cough: Weak    Oral/Motor/Sensory Function Overall Oral Motor/Sensory Function: Within functional limits Facial Symmetry: Within Functional Limits   Ice Chips Ice chips: Within functional limits Presentation: Spoon   Thin Liquid Thin Liquid: Within functional limits Presentation: Cup;Self Fed    Nectar Thick Nectar Thick Liquid: Not tested   Honey Thick Honey Thick Liquid: Not tested   Puree Puree: Within functional limits Presentation: Spoon(Unable to coordinate spoon to mouth, attempted to sip puree ) Other Comments: Able to grasp spoon but difficulty coordinating spoon to mouth, resorting to "sip" puree out of cup   Solid     Solid: Within functional limits Presentation: Self Fed Other Comments: Able to feed self easily without utensils, easily fed himself 3 bites of graham cracker      Demarie Uhlig, MA, CCC-SLP 04/20/2018,11:25 AM

## 2018-04-20 NOTE — Progress Notes (Signed)
[facility] - [place] at [facility]   PATIENT NAME: Alvin Hudson    MR#:  [id number]  DATE OF BIRTH:  08-13-1935  SUBJECTIVE:  CHIEF COMPLAINT:   Chief Complaint  Patient presents with  . Altered Mental Status   Awake. Sitting in a chair  REVIEW OF SYSTEMS:    Review of Systems  Unable to perform ROS: Mental status change    DRUG ALLERGIES:   Allergies  Allergen Reactions  . Aspirin Other (See Comments)    Caused ulcers  . Naproxen Other (See Comments)    Caused ulcers    VITALS:  Blood pressure 140/77, pulse (!) 59, temperature 98.3 F (36.8 C), temperature source Oral, resp. rate 20, height  (1.676 m), weight 67.2 kg, SpO2 100 %.  PHYSICAL EXAMINATION:   Physical Exam  GENERAL:  83 y.o.-year-old patient lying in the bed with no acute distress.  EYES: Pupils equal, round, reactive to light and accommodation. No scleral icterus. Extraocular muscles intact.  HEENT: Head atraumatic, normocephalic. Oropharynx and nasopharynx clear.  NECK:  Supple, no jugular venous distention. No thyroid enlargement, no tenderness.  LUNGS: Normal breath sounds bilaterally, no wheezing, rales, rhonchi. No use of accessory muscles of respiration.  CARDIOVASCULAR: S1, S2 normal. No murmurs, rubs, or gallops.  ABDOMEN: Soft, nontender, nondistended. Bowel sounds present. No organomegaly or mass.  EXTREMITIES: No cyanosis, clubbing or edema b/l.    NEUROLOGIC: Cranial nerves II through XII are intact. No focal Motor or sensory deficits b/l.   Word finding difficulty. PSYCHIATRIC: The patient is alert and awake SKIN: No obvious rash, lesion, or ulcer.   LABORATORY PANEL:   CBC Recent Labs  Lab 04/19/18 1537  WBC 6.9  HGB 12.2*  HCT 38.0*  PLT 162   ------------------------------------------------------------------------------------------------------------------ Chemistries  Recent Labs  Lab 04/19/18 1537  NA 136  K 4.3  CL 100  CO2 29  GLUCOSE 121*   BUN 11  CREATININE 0.83  CALCIUM 8.8*  AST 31  ALT 21  ALKPHOS 53  BILITOT 0.8   ------------------------------------------------------------------------------------------------------------------  Cardiac Enzymes Recent Labs  Lab 04/19/18 1537  TROPONINI <0.03   ------------------------------------------------------------------------------------------------------------------  RADIOLOGY:  Ct Head Wo Contrast  Result Date: 04/19/2018 CLINICAL DATA:  Speech difficulty. Left craniotomy 03/10/2018 for subdural hematoma EXAM: CT HEAD WITHOUT CONTRAST TECHNIQUE: Contiguous axial images were obtained from the base of the skull through the vertex without intravenous contrast. COMPARISON:  CT head 03/18/2018 FINDINGS: Brain: Moderate to advanced atrophy.  Negative for hydrocephalus. Left-sided subdural fluid collection is predominately low-density with thickened dura present. Fluid collection measures approximately 7.5 mm in thickness. Previously gas was present in this collection due to surgery. No hyperdense subdural hematoma. Slight midline shift to the right 2 mm. Cortical edema in the left frontal lobe is new since the prior study and is consistent with acute infarct causing speech difficulty. Mild chronic ischemic change in the white matter. Vascular: Negative for hyperdense vessel. Skull: Left frontal craniotomy.  No acute skeletal abnormality. Sinuses/Orbits: Negative Other: None IMPRESSION: Chronic low-density subdural fluid collection on the left. No acute hemorrhage. Mild mass-effect and mild midline shift to the right of 2 mm New hypodensity left frontal cortex compatible with acute infarct causing patient's current symptoms of speech difficulty. These results were called by telephone at the time of interpretation on 04/19/2018 at 4:12 pm to Dr. Rockne Menghini , who verbally acknowledged these results. Electronically Signed   By: Marlan Palau M.D.   On: 04/19/2018 16:13  Mr Brain  75 Contrast  Result Date: 04/19/2018 CLINICAL DATA:  Initial evaluation for acute fatigue E, recent craniotomy for subdural evacuation. EXAM: MRI HEAD WITHOUT CONTRAST TECHNIQUE: Multiplanar, multiecho pulse sequences of the brain and surrounding structures were obtained without intravenous contrast. COMPARISON:  Prior CT from earlier same day as well as earlier studies. FINDINGS: Brain: Chronic residual left subdural collection overlies the left frontotemporal convexity, measuring up to 1 cm in maximal diameter. Mild mass effect on the subjacent left cerebral hemisphere with trace 2 mm left-to-right shift. Focal 2.7 cm area of T2/FLAIR hyperintensity within the subjacent anterior left frontal lobe corresponds with hyperdensity seen on prior CT (series 9, image 20). No associated significant restricted diffusion. Finding favored to reflect sequelae of subacute ischemia. No associated hemorrhage or Underlying age-related cerebral atrophy. No other acute or subacute ischemic infarct. Gray-white matter differentiation otherwise maintained. No other evidence for acute or chronic intracranial hemorrhage. No other mass lesion.  No hydrocephalus.  Pituitary gland normal. Vascular: Major intracranial vascular flow voids maintained. Skull and upper cervical spine: Craniocervical junction normal. Degenerative spondylolysis noted within the partially visualized upper cervical spine. Bone marrow signal intensity normal. Prior left craniotomy. No scalp soft tissue abnormality. Sinuses/Orbits: Globes and orbital soft tissues within normal limits. Mucosal thickening with superimposed retention cyst noted within the left maxillary sinus. Paranasal sinuses are otherwise clear. No mastoid effusion. Inner ear structures normal. Other: None. IMPRESSION: 1. Focal 2.7 cm area of T2/FLAIR signal abnormality involving the anterior left frontal lobe, corresponding with hypodensity seen on prior CT. Finding is nonspecific, but favored to  reflect sequelae of subacute ischemia. Edema from a possible underlying mass would be difficult to exclude, and further assessment with postcontrast imaging suggested for complete evaluation. Irregardless, a short interval follow-up MRI to ensure these changes resolve is recommended. 2. Chronic left subdural collection measuring up to 10 mm in maximal thickness with associated trace 2 mm left-to-right shift. 3. Sequelae of prior left craniotomy. Electronically Signed   By: Rise Mu M.D.   On: 04/19/2018 17:50   US Carotid Bilateral (at [facility] Only)  Result Date: 04/20/2018 CLINICAL DATA:  83 year old male with a history of cerebrovascular accident EXAM: BILATERAL CAROTID DUPLEX ULTRASOUND TECHNIQUE: Wallace Cullens scale imaging, color Doppler and duplex ultrasound were performed of bilateral carotid and vertebral arteries in the neck. COMPARISON:  No prior duplex FINDINGS: Criteria: Quantification of carotid stenosis is based on velocity parameters that correlate the residual internal carotid diameter with NASCET-based stenosis levels, using the diameter of the distal internal carotid lumen as the denominator for stenosis measurement. The following velocity measurements were obtained: RIGHT ICA:  Systolic 63 cm/sec, Diastolic 14 cm/sec CCA:  53 cm/sec SYSTOLIC ICA/CCA RATIO:  1.2 ECA:  78 cm/sec LEFT ICA:  Systolic 62 cm/sec, Diastolic 15 cm/sec CCA:  88 cm/sec SYSTOLIC ICA/CCA RATIO:  0.7 ECA:  82 cm/sec Right Brachial SBP: Not acquired Left Brachial SBP: Not acquired RIGHT CAROTID ARTERY: No significant calcified disease of the right common carotid artery. Intermediate waveform maintained. Heterogeneous plaque without significant calcifications at the right carotid bifurcation. Low resistance waveform of the right ICA. No significant tortuosity. RIGHT VERTEBRAL ARTERY: Antegrade flow with low resistance waveform. LEFT CAROTID ARTERY: No significant calcified disease of the left common carotid artery.  Intermediate waveform maintained. Heterogeneous plaque at the left carotid bifurcation without significant calcifications. Low resistance waveform of the left ICA. LEFT VERTEBRAL ARTERY:  Antegrade flow with low resistance waveform. IMPRESSION: Color duplex indicates minimal heterogeneous plaque,  with no hemodynamically significant stenosis by duplex criteria in the extracranial cerebrovascular circulation. Signed, Yvone Neu. Reyne Dumas, RPVI [facility] Electronically Signed   By: Gilmer Mor D.O.   On: 04/20/2018 08:42   Dg Chest Port 1 View  Result Date: 04/19/2018 CLINICAL DATA:  CVA. EXAM: PORTABLE CHEST 1 VIEW COMPARISON:  03/19/2018 FINDINGS: Mild cardiomegaly improved from prior. Unchanged mediastinal contours. Improved bibasilar aeration with resolved pleural effusion and bibasilar opacities. No acute airspace disease. No pneumothorax or pulmonary edema. IMPRESSION: 1. No acute chest findings. 2. Improved lung aeration from February. Improvement in cardiomegaly with mild residual. Electronically Signed   By: Narda Rutherford M.D.   On: 04/19/2018 22:01     ASSESSMENT AND PLAN:   *Acute left frontal ischemic cerebrovascular accident Noted history of A. fib, chronic left M2 stenosis, recent craniotomy for chronic subdural hematoma which is still present with associated shift. Neurology consulted and appreciate input Repeat MRI ordered.  EEG pending.   *History of recent craniotomy for subdural hematoma Noted chronic subdural hematoma on the left 2 mm with left-to-right shift Conservative medical management, no anticoagulation.  *History of A. Fib Avoid anticoagulants given intracranial hemorrhage, recent surgery/craniectomy continue Cardizem  *Chronic seizure disorder Depakote, Vimpat, Keppra  *Chronic deconditioning Physical therapy to evaluate  All the records are reviewed and case discussed with Care  Management/Social Worker Management plans discussed with the patient, family and they are in agreement.  CODE STATUS: FULL CODE  DVT Prophylaxis: SCDs  TOTAL TIME TAKING CARE OF THIS PATIENT: 35 minutes.   POSSIBLE D/C IN 1-2 DAYS, DEPENDING ON CLINICAL CONDITION.  Molinda Bailiff Sayre Witherington M.D on 04/20/2018 at 1:18 PM  Between 7am to 6pm - Pager - [phone]  After 6pm go to [email or web address] - password [email or web address]  [facility]  Office  [phone]  CC: Primary care physician; Cain Sieve, MD  Note: This dictation was prepared with Dragon dictation along with smaller phrase technology. Any transcriptional errors that result from this process are unintentional.

## 2018-04-21 MED ORDER — LEVETIRACETAM 100 MG/ML PO SOLN
1500.0000 mg | Freq: Two times a day (BID) | ORAL | Status: DC
  Filled 2018-04-21: qty 15

## 2018-04-21 MED ORDER — LEVETIRACETAM 750 MG PO TABS
1500.0000 mg | ORAL_TABLET | Freq: Two times a day (BID) | ORAL | Status: DC
  Administered 2018-04-22: 11:00:00 1500 mg via ORAL
  Filled 2018-04-21 (×3): qty 2

## 2018-04-21 MED ORDER — LEVETIRACETAM 100 MG/ML PO SOLN
1500.0000 mg | Freq: Two times a day (BID) | ORAL | Status: AC
  Administered 2018-04-21: 1500 mg via ORAL
  Filled 2018-04-21: qty 15

## 2018-04-21 NOTE — [place] FL2 (Signed)
[place] MEDICAID FL2 LEVEL OF CARE SCREENING TOOL     IDENTIFICATION  Patient Name: Alvin Hudson Birthdate: November 04, 1935 Sex: male Admission Date (Current Location): 04/19/2018  [place] and [place] Number:  Chiropodist and Address:  [facility], [address], [place], [place] [address]      Provider Number: [id number]  Attending Physician Name and Address:  Milagros Loll, MD  Relative Name and Phone Number:       Current Level of Care: Hospital Recommended Level of Care: Skilled Nursing Facility Prior Approval Number:    Date Approved/Denied:   PASRR Number: [id number]  Discharge Plan: SNF    Current Diagnoses: Patient Active Problem List   Diagnosis Date Noted  . CVA (cerebral vascular accident) (HCC) 04/19/2018  . Anemia of chronic disease   . Megaloblastic anemia   . Hypoalbuminemia due to protein-calorie malnutrition (HCC)   . Leukocytosis   . New onset atrial fibrillation (HCC)   . Seizures (HCC)   . 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 History of subdural hematoma   . Dysphagia   . Atrial fibrillation (HCC) 03/17/2018  . Aphasia 03/12/2018  . Seizure (HCC) 03/12/2018  . Subdural hematoma (HCC) 03/10/2018    Orientation RESPIRATION BLADDER Height & Weight     Self, Time, Place  Normal Incontinent Weight: 148 lb 1.6 oz (67.2 kg) Height:  5\' 6"  (167.6 cm)  BEHAVIORAL SYMPTOMS/MOOD NEUROLOGICAL BOWEL NUTRITION STATUS  (none) (none) Continent Diet(Regular )  AMBULATORY STATUS COMMUNICATION OF NEEDS Skin   Limited Assist Verbally Normal                       Personal Care Assistance Level of Assistance  Bathing, Feeding, Dressing Bathing Assistance: Limited assistance Feeding assistance: Independent Dressing Assistance: Limited assistance     Functional Limitations Info  Sight, Hearing, Speech Sight Info: Adequate Hearing Info: Adequate Speech Info: Adequate    SPECIAL CARE FACTORS FREQUENCY   PT (By licensed PT), OT (By licensed OT)     PT Frequency: 5 OT Frequency: 5            Contractures Contractures Info: Not present    Additional Factors Info  Code Status, Allergies Code Status Info: DNR Allergies Info: Aspirin, Naproxen            Current Medications (04/21/2018):  This is the current hospital active medication list Current Facility-Administered Medications  Medication Dose Route Frequency Provider Last Rate Last Dose  . 0.9 %  sodium chloride infusion   Intravenous Continuous Salary, Montell D, MD 50 mL/hr at 04/21/18 1100    . acetaminophen (TYLENOL) tablet 650 mg  650 mg Oral Q4H PRN Salary, Montell D, MD       Or  . acetaminophen (TYLENOL) solution 650 mg  650 mg Per Tube Q4H PRN Salary, Montell D, MD       Or  . acetaminophen (TYLENOL) suppository 650 mg  650 mg Rectal Q4H PRN Salary, Montell D, MD      . apixaban (ELIQUIS) tablet 5 mg  5 mg Oral BID Thana Farr, MD   5 mg at 04/21/18 0949  . atorvastatin (LIPITOR) tablet 40 mg  40 mg Oral Daily Salary, Montell D, MD   40 mg at 04/20/18 2121  . diltiazem (CARDIZEM CD) 24 hr capsule 240 mg  240 mg Oral Daily Salary, Montell D, MD   240 mg at 04/21/18 0949  . divalproex (DEPAKOTE) DR tablet 500 mg  500 mg Oral Q8H Salary, Montell D, MD   500 mg at 04/21/18 0950  . docusate sodium (COLACE) capsule 100 mg  100 mg Oral BID Angelina Ok D, MD   100 mg at 04/21/18 0949  . HYDROcodone-acetaminophen (NORCO/VICODIN) 5-325 MG per tablet 1 tablet  1 tablet Oral Q4H PRN Salary, Montell D, MD      . lacosamide (VIMPAT) tablet 200 mg  200 mg Oral BID Salary, Montell D, MD   200 mg at 04/21/18 0949  . levETIRAcetam (KEPPRA) 100 MG/ML solution 1,500 mg  1,500 mg Oral BID Angelina Ok D, MD   1,500 mg at 04/21/18 0950  . loratadine (CLARITIN) tablet 10 mg  10 mg Oral Daily Salary, Montell D, MD   10 mg at 04/21/18 0949  . multivitamin (RENA-VIT) tablet 1 tablet  1 tablet Oral Daily Salary, Evelena Asa, MD   1  tablet at 04/21/18 0949  . polycarbophil (FIBERCON) tablet 625 mg  625 mg Oral Daily Salary, Montell D, MD   625 mg at 04/21/18 0950  . polyethylene glycol (MIRALAX / GLYCOLAX) packet 17 g  17 g Oral Daily Salary, Montell D, MD   17 g at 04/21/18 0950  . senna-docusate (Senokot-S) tablet 1 tablet  1 tablet Oral QHS PRN Bertrum Sol, MD   1 tablet at 04/20/18 2121  . senna-docusate (Senokot-S) tablet 2 tablet  2 tablet Oral QHS Angelina Ok D, MD   2 tablet at 04/20/18 2129  . sertraline (ZOLOFT) tablet 100 mg  100 mg Oral Daily Salary, Montell D, MD   100 mg at 04/21/18 0949  . zonisamide (ZONEGRAN) capsule 100 mg  100 mg Oral Daily Thana Farr, MD   100 mg at 04/20/18 2130     Discharge Medications: Please see discharge summary for a list of discharge medications.  Relevant Imaging Results:  Relevant Lab Results:   Additional Information SSN: [id number]  Ruthe Mann[place], [place]

## 2018-04-21 NOTE — Progress Notes (Signed)
SLP Cancellation Note  Patient Details Name: Alvin Hudson MRN: [id number] DOB: [birth year]   Cancelled treatment:       Reason Eval/Treat Not Completed: (chart reviewed; consulted SW re: d/c plan)  Per SW, the plan is for pt to Discharge to a SNF for Rehab. Pt was recently discharged from [facility](inpatient rehab) where he received Cognitive-linguistic therapy for ~1 month. Pt met his long term goals established per discharge summary but "continues to require overall supervision level verbal cues for recall, attention, awareness and basic problem solving. Patient and family education is completed and patient will discharge home with 24 hour supervision from family. Patient would benefit from f/u SLP services to maximize his cognitive-linguistic function and overall functional independence in order to reduce caregiver burden.". Pt continues to use strategies learned already at [facility]. As pt was scheduled for to begin formal assessment/therapy via Outpatient services the day of this admission, and Wife/pt now requesting SNF Rehab services d/t pt's needs, ST services will hold on any informal screening at bedside at this time. Pt will receive skilled ST services for Cognitive-linguistic therapy upon discharge to SNF per SW in order to continue to work on maximizing his function gained at [facility](inpatient rehab). This was discussed w/ SW and MD who agreed.  Discharge planning is ongoing currently.    Orinda Kenner, MS, CCC-SLP Watson,Katherine 04/21/2018, 2:24 PM

## 2018-04-21 NOTE — Progress Notes (Addendum)
Subjective: No worsening symptoms of aphasia or right side weakness reported. Patient remains aphasic with some improvement this morning.  Objective: Current vital signs: BP 133/77 (BP Location: Right Arm)   Pulse 63   Temp 98.5 F (36.9 C) (Oral)   Resp 16   Ht 5\' 6"  (1.676 m)   Wt 67.2 kg   SpO2 98%   BMI 23.90 kg/m  Vital signs in last 24 hours: Temp:  [98.2 F (36.8 C)-98.5 F (36.9 C)] 98.5 F (36.9 C) (03/27 0448) Pulse Rate:  [59-64] 63 (03/27 0448) Resp:  [16-20] 16 (03/27 0448) BP: (133-149)/(76-77) 133/77 (03/27 0448) SpO2:  [96 %-99 %] 98 % (03/27 0448)  Intake/Output from previous day: 03/26 0701 - 03/27 0700 In: 1396.8 [P.O.:480; I.V.:916.8] Out: 580 [Urine:580] Intake/Output this shift: Total I/O In: 394.4 [I.V.:394.4] Out: -  Nutritional status:  Diet Order            Diet regular Room service appropriate? Yes; Fluid consistency: Thin  Diet effective now             Neurological Exam  Mental Status: Alert. Patient initially fluent but with further conversation became significantly aphasic. Able to follow some commands but a times required prompting and cueing.  Cranial Nerves: ZO:XWRUEAI:Blinks to bilateral confrontation, pupils equal, round, reactive to light and accommodation III,IV, VI: ptosis not present, extra-ocular motions intact bilaterally V,VII: smile symmetric, facial light touch sensationintact VIII: hearing normal bilaterally IX,X: gag reflex present XI: bilateral shoulder shrug XII: midline tongue extension Motor: 5/5 throughout with no focal weakness noted  Lab Results: Basic Metabolic Panel: Recent Labs  Lab 04/19/18 1537  NA 136  K 4.3  CL 100  CO2 29  GLUCOSE 121*  BUN 11  CREATININE 0.83  CALCIUM 8.8*    Liver Function Tests: Recent Labs  Lab 04/19/18 1537  AST 31  ALT 21  ALKPHOS 53  BILITOT 0.8  PROT 6.5  ALBUMIN 3.4*   No results for input(s): LIPASE, AMYLASE in the last 168 hours. No results for  input(s): AMMONIA in the last 168 hours.  CBC: Recent Labs  Lab 04/19/18 1537  WBC 6.9  NEUTROABS 3.7  HGB 12.2*  HCT 38.0*  MCV 111.1*  PLT 162    Cardiac Enzymes: Recent Labs  Lab 04/19/18 1537  TROPONINI <0.03    Lipid Panel: Recent Labs  Lab 04/20/18 0337  CHOL 108  TRIG 73  HDL 34*  CHOLHDL 3.2  VLDL 15  LDLCALC 59    CBG: No results for input(s): GLUCAP in the last 168 hours.  Microbiology: Results for orders placed or performed during the hospital encounter of 03/12/18  Culture, blood (Routine X 2) w Reflex to ID Panel     Status: None   Collection Time: 03/13/18  2:35 PM  Result Value Ref Range Status   Specimen Description BLOOD RIGHT ANTECUBITAL  Final   Special Requests   Final    BOTTLES DRAWN AEROBIC AND ANAEROBIC Blood Culture adequate volume Performed at [facility], [address] [place], [place] [address]    Culture NO GROWTH 5 DAYS  Final   Report Status 03/18/2018 FINAL  Final  Culture, blood (Routine X 2) w Reflex to ID Panel     Status: None   Collection Time: 03/13/18  2:45 PM  Result Value Ref Range Status   Specimen Description BLOOD RIGHT HAND  Final   Special Requests   Final    BOTTLES DRAWN AEROBIC ONLY Blood  Culture results may not be optimal due to an inadequate volume of blood received in culture bottles Performed at [facility], [address] [place], [place] [address]    Culture NO GROWTH 5 DAYS  Final   Report Status 03/18/2018 FINAL  Final    Coagulation Studies: No results for input(s): LABPROT, INR in the last 72 hours.  Imaging: Ct Head Wo Contrast  Result Date: 04/19/2018 CLINICAL DATA:  Speech difficulty. Left craniotomy 03/10/2018 for subdural hematoma EXAM: CT HEAD WITHOUT CONTRAST TECHNIQUE: Contiguous axial images were obtained from the base of the skull through the vertex without intravenous contrast. COMPARISON:  CT head 03/18/2018 FINDINGS: Brain: Moderate to advanced atrophy.  Negative  for hydrocephalus. Left-sided subdural fluid collection is predominately low-density with thickened dura present. Fluid collection measures approximately 7.5 mm in thickness. Previously gas was present in this collection due to surgery. No hyperdense subdural hematoma. Slight midline shift to the right 2 mm. Cortical edema in the left frontal lobe is new since the prior study and is consistent with acute infarct causing speech difficulty. Mild chronic ischemic change in the white matter. Vascular: Negative for hyperdense vessel. Skull: Left frontal craniotomy.  No acute skeletal abnormality. Sinuses/Orbits: Negative Other: None IMPRESSION: Chronic low-density subdural fluid collection on the left. No acute hemorrhage. Mild mass-effect and mild midline shift to the right of 2 mm New hypodensity left frontal cortex compatible with acute infarct causing patient's current symptoms of speech difficulty. These results were called by telephone at the time of interpretation on 04/19/2018 at 4:12 pm to Dr. Rockne Menghini , who verbally acknowledged these results. Electronically Signed   By: Marlan Palau M.D.   On: 04/19/2018 16:13   Mr Brain Wo Contrast  Result Date: 04/19/2018 CLINICAL DATA:  Initial evaluation for acute fatigue E, recent craniotomy for subdural evacuation. EXAM: MRI HEAD WITHOUT CONTRAST TECHNIQUE: Multiplanar, multiecho pulse sequences of the brain and surrounding structures were obtained without intravenous contrast. COMPARISON:  Prior CT from earlier same day as well as earlier studies. FINDINGS: Brain: Chronic residual left subdural collection overlies the left frontotemporal convexity, measuring up to 1 cm in maximal diameter. Mild mass effect on the subjacent left cerebral hemisphere with trace 2 mm left-to-right shift. Focal 2.7 cm area of T2/FLAIR hyperintensity within the subjacent anterior left frontal lobe corresponds with hyperdensity seen on prior CT (series 9, image 20). No  associated significant restricted diffusion. Finding favored to reflect sequelae of subacute ischemia. No associated hemorrhage or Underlying age-related cerebral atrophy. No other acute or subacute ischemic infarct. Gray-white matter differentiation otherwise maintained. No other evidence for acute or chronic intracranial hemorrhage. No other mass lesion.  No hydrocephalus.  Pituitary gland normal. Vascular: Major intracranial vascular flow voids maintained. Skull and upper cervical spine: Craniocervical junction normal. Degenerative spondylolysis noted within the partially visualized upper cervical spine. Bone marrow signal intensity normal. Prior left craniotomy. No scalp soft tissue abnormality. Sinuses/Orbits: Globes and orbital soft tissues within normal limits. Mucosal thickening with superimposed retention cyst noted within the left maxillary sinus. Paranasal sinuses are otherwise clear. No mastoid effusion. Inner ear structures normal. Other: None. IMPRESSION: 1. Focal 2.7 cm area of T2/FLAIR signal abnormality involving the anterior left frontal lobe, corresponding with hypodensity seen on prior CT. Finding is nonspecific, but favored to reflect sequelae of subacute ischemia. Edema from a possible underlying mass would be difficult to exclude, and further assessment with postcontrast imaging suggested for complete evaluation. Irregardless, a short interval follow-up MRI to  ensure these changes resolve is recommended. 2. Chronic left subdural collection measuring up to 10 mm in maximal thickness with associated trace 2 mm left-to-right shift. 3. Sequelae of prior left craniotomy. Electronically Signed   By: Rise Mu M.D.   On: 04/19/2018 17:50   Mr Brain W Contrast  Result Date: 04/20/2018 CLINICAL DATA:  Left anterior frontal lobe abnormality favored to represent stroke. Tumor not excluded. Contrast study requested. EXAM: MRI HEAD WITH CONTRAST TECHNIQUE: Multiplanar, multiecho pulse  sequences of the brain and surrounding structures were obtained with intravenous contrast. CONTRAST:  6 cc Gadavist COMPARISON:  MRI and CT done yesterday.  MRI 03/13/2018. FINDINGS: Brain: After contrast administration, there is no abnormal enhancement in the left frontal lobe in the region of cortical and subcortical vasogenic edema. This appears very similar on the coronal T2 imaging compared yesterday. Left convexity residual subdural material is unchanged, maximal thickness 9 mm. Expected dural enhancement on the left is noted. I think this appearance could well represent some edema secondary to superficial venous thrombosis or congestion. Vascular: Major vessels at the base of the brain show flow. Skull and upper cervical spine: Previous left sciatic craniotomy. Sinuses/Orbits: Mild seasonal mucosal thickening.  Orbits negative. Other: None IMPRESSION: After contrast administration, there is no abnormal enhancement in the region of cortical and subcortical edema in the left posterior frontal region. There is overlying dural enhancement and there is no change in residual left convexity subdural material, maximal thickness 9 mm. I think this study excludes the presence of an underlying mass. The differential diagnosis include cerebritis and edema secondary to superficial venous thrombosis or congestion without true infarction. Venous disease is the favored explanation. Electronically Signed   By: Paulina Fusi M.D.   On: 04/20/2018 15:37   US Carotid Bilateral (at [facility] Only)  Result Date: 04/20/2018 CLINICAL DATA:  83 year old male with a history of cerebrovascular accident EXAM: BILATERAL CAROTID DUPLEX ULTRASOUND TECHNIQUE: Wallace Cullens scale imaging, color Doppler and duplex ultrasound were performed of bilateral carotid and vertebral arteries in the neck. COMPARISON:  No prior duplex FINDINGS: Criteria: Quantification of carotid stenosis is based on velocity parameters that correlate the residual internal  carotid diameter with NASCET-based stenosis levels, using the diameter of the distal internal carotid lumen as the denominator for stenosis measurement. The following velocity measurements were obtained: RIGHT ICA:  Systolic 63 cm/sec, Diastolic 14 cm/sec CCA:  53 cm/sec SYSTOLIC ICA/CCA RATIO:  1.2 ECA:  78 cm/sec LEFT ICA:  Systolic 62 cm/sec, Diastolic 15 cm/sec CCA:  88 cm/sec SYSTOLIC ICA/CCA RATIO:  0.7 ECA:  82 cm/sec Right Brachial SBP: Not acquired Left Brachial SBP: Not acquired RIGHT CAROTID ARTERY: No significant calcified disease of the right common carotid artery. Intermediate waveform maintained. Heterogeneous plaque without significant calcifications at the right carotid bifurcation. Low resistance waveform of the right ICA. No significant tortuosity. RIGHT VERTEBRAL ARTERY: Antegrade flow with low resistance waveform. LEFT CAROTID ARTERY: No significant calcified disease of the left common carotid artery. Intermediate waveform maintained. Heterogeneous plaque at the left carotid bifurcation without significant calcifications. Low resistance waveform of the left ICA. LEFT VERTEBRAL ARTERY:  Antegrade flow with low resistance waveform. IMPRESSION: Color duplex indicates minimal heterogeneous plaque, with no hemodynamically significant stenosis by duplex criteria in the extracranial cerebrovascular circulation. Signed, Yvone Neu. Reyne Dumas, RPVI [facility] Electronically Signed   By: Gilmer Mor D.O.   On: 04/20/2018 08:42   Dg Chest Port 1 View  Result Date:  04/19/2018 CLINICAL DATA:  CVA. EXAM: PORTABLE CHEST 1 VIEW COMPARISON:  03/19/2018 FINDINGS: Mild cardiomegaly improved from prior. Unchanged mediastinal contours. Improved bibasilar aeration with resolved pleural effusion and bibasilar opacities. No acute airspace disease. No pneumothorax or pulmonary edema. IMPRESSION: 1. No acute chest findings. 2. Improved lung aeration from  February. Improvement in cardiomegaly with mild residual. Electronically Signed   By: Narda Rutherford M.D.   On: 04/19/2018 22:01    Medications:  I have reviewed the patient's current medications. Prior to Admission:  Medications Prior to Admission  Medication Sig Dispense Refill Last Dose  . acetaminophen (TYLENOL) 325 MG tablet Take 2 tablets (650 mg total) by mouth every 4 (four) hours as needed for mild pain (temp > 100.5).   Unknown at PRN  . atorvastatin (LIPITOR) 20 MG tablet Take 1 tablet (20 mg total) by mouth daily. 30 tablet 0 04/19/2018 at 0800  . B Complex-C-Folic Acid (B COMPLEX-VITAMIN C-FOLIC ACID) 1 MG tablet Take 1 tablet by mouth daily.   04/19/2018 at 1200  . diltiazem (CARDIZEM CD) 240 MG 24 hr capsule Take 1 capsule (240 mg total) by mouth daily. 30 capsule 1 04/19/2018 at 0800  . divalproex (DEPAKOTE) 500 MG DR tablet Take 1 tablet (500 mg total) by mouth every 8 (eight) hours. 90 tablet 0 04/19/2018 at 0800  . docusate sodium (COLACE) 100 MG capsule Take 1 capsule (100 mg total) by mouth 2 (two) times daily. 60 capsule 0 04/19/2018 at 0800  . HYDROcodone-acetaminophen (NORCO/VICODIN) 5-325 MG tablet Take 1 tablet by mouth every 4 (four) hours as needed for moderate pain. 20 tablet 0 04/19/2018 at PRN  . lacosamide (VIMPAT) 200 MG TABS tablet Take 1 tablet (200 mg total) by mouth 2 (two) times daily. 60 tablet 1 04/19/2018 at 0800  . levETIRAcetam (KEPPRA) 100 MG/ML solution Take 15 mLs (1,500 mg total) by mouth 2 (two) times daily. 473 mL 12 04/19/2018 at 0800  . loratadine (CLARITIN) 10 MG tablet Take 1 tablet (10 mg total) by mouth daily. 30 tablet 0 04/19/2018 at 0800  . polycarbophil (FIBERCON) 625 MG tablet Take 625 mg by mouth daily.   04/19/2018 at 1200  . polyethylene glycol (MIRALAX / GLYCOLAX) packet Take 17 g by mouth daily. 14 each 0 04/19/2018 at Unknown time  . senna-docusate (SENOKOT-S) 8.6-50 MG tablet Take 2 tablets by mouth at bedtime.   04/19/2018 at Unknown time   . sertraline (ZOLOFT) 100 MG tablet Take 1 tablet (100 mg total) by mouth daily. 30 tablet 0 04/19/2018 at 0800   Scheduled: . apixaban  5 mg Oral BID  . atorvastatin  40 mg Oral Daily  . diltiazem  240 mg Oral Daily  . divalproex  500 mg Oral Q8H  . docusate sodium  100 mg Oral BID  . lacosamide  200 mg Oral BID  . levETIRAcetam  1,500 mg Oral BID  . loratadine  10 mg Oral Daily  . multivitamin  1 tablet Oral Daily  . polycarbophil  625 mg Oral Daily  . polyethylene glycol  17 g Oral Daily  . senna-docusate  2 tablet Oral QHS  . sertraline  100 mg Oral Daily  . zonisamide  100 mg Oral Daily   Patient seen and examined.  Clinical course and management discussed.  Necessary edits performed.  I agree with the above.  Assessment and plan of care developed and discussed below.    Assessment:82 y.o.malewith past medical history of seizure on Depakote, Keppra and Vimpat  following with neurology (Dr[facility]Felixat [facility]),A. fib with RVR not on anticoagulation,SDH s/pevacuation. After speaking with the wife it seems that there is no significant change in the patient since discharge in February but she was concerned that her outpatient follow up was not appropriate.  Despite this the patient does have a new finding on MRI.  Follow up MRI brain reviewed and shows. Finding was discussed with Vascular Neurology, Dr. Roda Shutters, who cared for him in February with his initial presentation.  After extensive conversation and review of films it is felt that the new finding on imaging is either related to venous congestion or an infarct that occurred closer to his previous hospitalization. Acute hemorrhage has resolved.   Patient with a history of atrial fibrillation and in the setting of infarct the patient was started on Eliquis.  Appears to be tolerating well with no change in neurological examination.  No evidence of fluctuating examination.     Recommendations: 1.  Continue Eliquis at current dose  of  BID 2.  Continue Lacosamide 200 mg BID, Keppra 1500 mg BID, Depakote 500 mg TID and  Zonegran 100 mg daily. Will remain on this regime at discharge and patient to follow up with neurology on an outpatient basis 3. Patient unable to drive, operate heavy machinery, perform activities at heights and participate in water activities until release by outpatient physician.   This patient was staffed with Dr. Verlon Au, Thad Ranger who personally evaluated patient, reviewed documentation and agreed with assessment and plan of care as above.  Webb Silversmith, DNP, FNP-BC Board certified Nurse Practitioner Neurology Department   LOS: 2 days    04/21/2018  9:44 AM  No further neurologic intervention is recommended at this time.  If further questions arise, please call or page at that time.  Thank you for allowing neurology to participate in the care of this patient.  Thana Farr, MD Neurology [phone] 04/21/2018  10:55 AM

## 2018-04-21 NOTE — Discharge Instructions (Signed)

## 2018-04-21 NOTE — TOC Progression Note (Signed)
Transition of Care [facility]) - Progression Note    Patient Details  Name: Alvin Hudson MRN: [id number] Date of Birth: 1935/12/22  Transition of Care [facility]) CM/SW Contact  Ruthe Mannan, [place] Phone Number: 04/21/2018, 4:04 PM  Clinical Narrative:   CSW notified by MD that family wants patient to go to SNF. CSW spoke with patient's wife [name] and daughter [name] [phone]. Both are in agreement with SNF placement. CSW extended bed offers and family chose [facility]. CSW also explained CMS quality ratings over the phone. CS[facility] notified [facility] at [facility] of bed acceptance. CSW will continue to follow for discharge planning.     Expected Discharge Plan: (continued medical work up) Barriers to Discharge: Continued Medical Work up  Expected Discharge Plan and Services Expected Discharge Plan: (continued medical work up) In-house Referral: (continued medical work up) Discharge Planning Services: CM Consult Post Acute Care Choice: NA Living arrangements for the past 2 months: Single Family Home Expected Discharge Date: 04/21/18               DME Arranged: Dan Humphreys rolling, Tub bench(Discharged from [facility] 04/11/18) DME Agency: [facility] HH Arranged: NA HH Agency: [facility])   Social Determinants of Health (SDOH) Interventions    Readmission Risk Interventions No flowsheet data found.

## 2018-04-21 NOTE — Progress Notes (Signed)
Physical Therapy Treatment Patient Details Name: Alvin Hudson MRN: [id number] DOB: [birth year] Today's Date: 04/21/2018    History of Present Illness 83 y.o. male with a known history to include craniectomy for left subdural hematoma evacuation in February 2020, seizure disorder, history of A. fib, , high-grade left M2 stenosis, presenting with left arm weakness, problems with speech, speech arrest, falls, difficulty ambulating, generalized weakness, fatigue. Work up showed left frontal subdural hematoma, acute L frontal CVA.    PT Comments    Patient agreeable to PT with encouragement, denied pain at this time. Patient able to perform bed level and EOB therapeutic exercises with constant verbal and visual cues for exercise technique/form. Neuromuscular re-education interventions performed (see miscellaneous exercises) in standing. Intermittent handheld assist and constant CGA needed for balance exercises. Pt able to perform stand pivot transfer with handheld assist to bedside commode. With nursing techs at end of session. The patient would benefit from further skilled PT intervention to continue to progress towards goals.    Follow Up Recommendations  Outpatient PT;Supervision/Assistance - 24 hour     Equipment Recommendations  None recommended by PT    Recommendations for Other Services       Precautions / Restrictions Precautions Precautions: Fall Restrictions Weight Bearing Restrictions: No    Mobility  Bed Mobility Overal bed mobility: Needs Assistance Bed Mobility: Supine to Sit     Supine to sit: Supervision        Transfers Overall transfer level: Needs assistance Equipment used: 2 person hand held assist Transfers: Sit to/from Stand Sit to Stand: Min guard         General transfer comment: x2 this session  Ambulation/Gait Ambulation/Gait assistance: Min guard Gait Distance (Feet): 3 Feet Assistive device: 1 person hand held assist       General Gait  Details: quick transfer to bedside commode with handheld assist   Stairs             Wheelchair Mobility    Modified Rankin (Stroke Patients Only)       Balance Overall balance assessment: Needs assistance Sitting-balance support: Feet supported Sitting balance-Leahy Scale: Good Sitting balance - Comments: Independent     Standing balance-Leahy Scale: Fair                              Cognition Arousal/Alertness: Awake/alert Behavior During Therapy: WFL for tasks assessed/performed Overall Cognitive Status: No family/caregiver present to determine baseline cognitive functioning Area of Impairment: Memory;Following commands;Problem solving;Safety/judgement                     Memory: Decreased short-term memory Following Commands: Follows one step commands consistently Safety/Judgement: Decreased awareness of safety   Problem Solving: Slow processing;Difficulty sequencing;Requires verbal cues;Requires tactile cues        Exercises General Exercises - Lower Extremity Ankle Circles/Pumps: AROM;Both;10 reps Heel Slides: AROM;Both;10 reps;Strengthening Hip ABduction/ADduction: AROM;Both;10 reps;Strengthening Straight Leg Raises: AROM;Both;10 reps;Strengthening Hip Flexion/Marching: AROM;Strengthening;Both;10 reps Other Exercises Other Exercises: Able to perform higher level balance activities with intermittent 1 handheld assist. Narrowed base of support eyes open, eyes closed, horizontal head turns, eyes open    General Comments        Pertinent Vitals/Pain Pain Assessment: No/denies pain    Home Living Family/patient expects to be discharged to:: Private residence Living Arrangements: Spouse/significant other                  Prior Function  PT Goals (current goals can now be found in the care plan section) Progress towards PT goals: Progressing toward goals    Frequency    7X/week      PT Plan Current  plan remains appropriate    Co-evaluation              AM-PAC PT "6 Clicks" Mobility   Outcome Measure  Help needed turning from your back to your side while in a flat bed without using bedrails?: A Little Help needed moving from lying on your back to sitting on the side of a flat bed without using bedrails?: A Little Help needed moving to and from a bed to a chair (including a wheelchair)?: A Little Help needed standing up from a chair using your arms (e.g., wheelchair or bedside chair)?: A Little Help needed to walk in hospital room?: A Little Help needed climbing 3-5 steps with a railing? : A Lot 6 Click Score: 17    End of Session Equipment Utilized During Treatment: Gait belt Activity Tolerance: Patient tolerated treatment well Patient left: Other (comment)(sitting on bedside commode with nursing tech) Nurse Communication: Mobility status PT Visit Diagnosis: Other abnormalities of gait and mobility (R26.89);Muscle weakness (generalized) (M62.81);Difficulty in walking, not elsewhere classified (R26.2)     Time: 0940-1003 PT Time Calculation (min) (ACUTE ONLY): 23 min  Charges:  $Therapeutic Exercise: 8-22 mins $Neuromuscular Re-education: 8-22 mins                     Olga Coaster PT, DPT 10:27 AM,04/21/18 [phone]

## 2018-04-21 NOTE — Progress Notes (Signed)
[facility]   PATIENT NAME: Alvin Hudson    MR#:  [id number]  DATE OF BIRTH:  1935-10-06  SUBJECTIVE:  CHIEF COMPLAINT:   Chief Complaint  Patient presents with  . Altered Mental Status   Awake. Still has word finding difficulty.  REVIEW OF SYSTEMS:    Review of Systems  Unable to perform ROS: Mental status change    DRUG ALLERGIES:   Allergies  Allergen Reactions  . Aspirin Other (See Comments)    Caused ulcers  . Naproxen Other (See Comments)    Caused ulcers    VITALS:  Blood pressure 133/77, pulse 63, temperature 98.5 F (36.9 C), temperature source Oral, resp. rate 16, height  (1.676 m), weight 67.2 kg, SpO2 98 %.  PHYSICAL EXAMINATION:   Physical Exam  GENERAL:  83 y.o.-year-old patient lying in the bed with no acute distress.  EYES: Pupils equal, round, reactive to light and accommodation. No scleral icterus. Extraocular muscles intact.  HEENT: Head atraumatic, normocephalic. Oropharynx and nasopharynx clear.  NECK:  Supple, no jugular venous distention. No thyroid enlargement, no tenderness.  LUNGS: Normal breath sounds bilaterally, no wheezing, rales, rhonchi. No use of accessory muscles of respiration.  CARDIOVASCULAR: S1, S2 normal. No murmurs, rubs, or gallops.  ABDOMEN: Soft, nontender, nondistended. Bowel sounds present. No organomegaly or mass.  EXTREMITIES: No cyanosis, clubbing or edema b/l.    NEUROLOGIC: Cranial nerves II through XII are intact. No focal Motor or sensory deficits b/l.   Word finding difficulty. PSYCHIATRIC: The patient is alert and awake SKIN: No obvious rash, lesion, or ulcer.   LABORATORY PANEL:   CBC Recent Labs  Lab 04/19/18 1537  WBC 6.9  HGB 12.2*  HCT 38.0*  PLT 162   ------------------------------------------------------------------------------------------------------------------ Chemistries  Recent Labs  Lab 04/19/18 1537  NA 136  K 4.3  CL 100  CO2 29   GLUCOSE 121*  BUN 11  CREATININE 0.83  CALCIUM 8.8*  AST 31  ALT 21  ALKPHOS 53  BILITOT 0.8   ------------------------------------------------------------------------------------------------------------------  Cardiac Enzymes Recent Labs  Lab 04/19/18 1537  TROPONINI <0.03   ------------------------------------------------------------------------------------------------------------------  RADIOLOGY:  Ct Head Wo Contrast  Result Date: 04/19/2018 CLINICAL DATA:  Speech difficulty. Left craniotomy 03/10/2018 for subdural hematoma EXAM: CT HEAD WITHOUT CONTRAST TECHNIQUE: Contiguous axial images were obtained from the base of the skull through the vertex without intravenous contrast. COMPARISON:  CT head 03/18/2018 FINDINGS: Brain: Moderate to advanced atrophy.  Negative for hydrocephalus. Left-sided subdural fluid collection is predominately low-density with thickened dura present. Fluid collection measures approximately 7.5 mm in thickness. Previously gas was present in this collection due to surgery. No hyperdense subdural hematoma. Slight midline shift to the right 2 mm. Cortical edema in the left frontal lobe is new since the prior study and is consistent with acute infarct causing speech difficulty. Mild chronic ischemic change in the white matter. Vascular: Negative for hyperdense vessel. Skull: Left frontal craniotomy.  No acute skeletal abnormality. Sinuses/Orbits: Negative Other: None IMPRESSION: Chronic low-density subdural fluid collection on the left. No acute hemorrhage. Mild mass-effect and mild midline shift to the right of 2 mm New hypodensity left frontal cortex compatible with acute infarct causing patient's current symptoms of speech difficulty. These results were called by telephone at the time of interpretation on 04/19/2018 at 4:12 pm to Dr. Rockne Menghini , who verbally acknowledged these results. Electronically Signed   By: Marlan Palau M.D.   On: 04/19/2018 16:13  Mr Brain 13 Contrast  Result Date: 04/19/2018 CLINICAL DATA:  Initial evaluation for acute fatigue E, recent craniotomy for subdural evacuation. EXAM: MRI HEAD WITHOUT CONTRAST TECHNIQUE: Multiplanar, multiecho pulse sequences of the brain and surrounding structures were obtained without intravenous contrast. COMPARISON:  Prior CT from earlier same day as well as earlier studies. FINDINGS: Brain: Chronic residual left subdural collection overlies the left frontotemporal convexity, measuring up to 1 cm in maximal diameter. Mild mass effect on the subjacent left cerebral hemisphere with trace 2 mm left-to-right shift. Focal 2.7 cm area of T2/FLAIR hyperintensity within the subjacent anterior left frontal lobe corresponds with hyperdensity seen on prior CT (series 9, image 20). No associated significant restricted diffusion. Finding favored to reflect sequelae of subacute ischemia. No associated hemorrhage or Underlying age-related cerebral atrophy. No other acute or subacute ischemic infarct. Gray-white matter differentiation otherwise maintained. No other evidence for acute or chronic intracranial hemorrhage. No other mass lesion.  No hydrocephalus.  Pituitary gland normal. Vascular: Major intracranial vascular flow voids maintained. Skull and upper cervical spine: Craniocervical junction normal. Degenerative spondylolysis noted within the partially visualized upper cervical spine. Bone marrow signal intensity normal. Prior left craniotomy. No scalp soft tissue abnormality. Sinuses/Orbits: Globes and orbital soft tissues within normal limits. Mucosal thickening with superimposed retention cyst noted within the left maxillary sinus. Paranasal sinuses are otherwise clear. No mastoid effusion. Inner ear structures normal. Other: None. IMPRESSION: 1. Focal 2.7 cm area of T2/FLAIR signal abnormality involving the anterior left frontal lobe, corresponding with hypodensity seen on prior CT. Finding is nonspecific, but  favored to reflect sequelae of subacute ischemia. Edema from a possible underlying mass would be difficult to exclude, and further assessment with postcontrast imaging suggested for complete evaluation. Irregardless, a short interval follow-up MRI to ensure these changes resolve is recommended. 2. Chronic left subdural collection measuring up to 10 mm in maximal thickness with associated trace 2 mm left-to-right shift. 3. Sequelae of prior left craniotomy. Electronically Signed   By: Rise Mu M.D.   On: 04/19/2018 17:50   Mr Brain W Contrast  Result Date: 04/20/2018 CLINICAL DATA:  Left anterior frontal lobe abnormality favored to represent stroke. Tumor not excluded. Contrast study requested. EXAM: MRI HEAD WITH CONTRAST TECHNIQUE: Multiplanar, multiecho pulse sequences of the brain and surrounding structures were obtained with intravenous contrast. CONTRAST:  6 cc Gadavist COMPARISON:  MRI and CT done yesterday.  MRI 03/13/2018. FINDINGS: Brain: After contrast administration, there is no abnormal enhancement in the left frontal lobe in the region of cortical and subcortical vasogenic edema. This appears very similar on the coronal T2 imaging compared yesterday. Left convexity residual subdural material is unchanged, maximal thickness 9 mm. Expected dural enhancement on the left is noted. I think this appearance could well represent some edema secondary to superficial venous thrombosis or congestion. Vascular: Major vessels at the base of the brain show flow. Skull and upper cervical spine: Previous left sciatic craniotomy. Sinuses/Orbits: Mild seasonal mucosal thickening.  Orbits negative. Other: None IMPRESSION: After contrast administration, there is no abnormal enhancement in the region of cortical and subcortical edema in the left posterior frontal region. There is overlying dural enhancement and there is no change in residual left convexity subdural material, maximal thickness 9 mm. I think  this study excludes the presence of an underlying mass. The differential diagnosis include cerebritis and edema secondary to superficial venous thrombosis or congestion without true infarction. Venous disease is the favored explanation. Electronically Signed   By: Loraine Leriche  Shogry M.D.   On: 04/20/2018 15:37   US Carotid Bilateral (at [facility] Only)  Result Date: 04/20/2018 CLINICAL DATA:  83 year old male with a history of cerebrovascular accident EXAM: BILATERAL CAROTID DUPLEX ULTRASOUND TECHNIQUE: Wallace Cullens scale imaging, color Doppler and duplex ultrasound were performed of bilateral carotid and vertebral arteries in the neck. COMPARISON:  No prior duplex FINDINGS: Criteria: Quantification of carotid stenosis is based on velocity parameters that correlate the residual internal carotid diameter with NASCET-based stenosis levels, using the diameter of the distal internal carotid lumen as the denominator for stenosis measurement. The following velocity measurements were obtained: RIGHT ICA:  Systolic 63 cm/sec, Diastolic 14 cm/sec CCA:  53 cm/sec SYSTOLIC ICA/CCA RATIO:  1.2 ECA:  78 cm/sec LEFT ICA:  Systolic 62 cm/sec, Diastolic 15 cm/sec CCA:  88 cm/sec SYSTOLIC ICA/CCA RATIO:  0.7 ECA:  82 cm/sec Right Brachial SBP: Not acquired Left Brachial SBP: Not acquired RIGHT CAROTID ARTERY: No significant calcified disease of the right common carotid artery. Intermediate waveform maintained. Heterogeneous plaque without significant calcifications at the right carotid bifurcation. Low resistance waveform of the right ICA. No significant tortuosity. RIGHT VERTEBRAL ARTERY: Antegrade flow with low resistance waveform. LEFT CAROTID ARTERY: No significant calcified disease of the left common carotid artery. Intermediate waveform maintained. Heterogeneous plaque at the left carotid bifurcation without significant calcifications. Low resistance waveform of the left ICA. LEFT VERTEBRAL ARTERY:  Antegrade flow with low resistance  waveform. IMPRESSION: Color duplex indicates minimal heterogeneous plaque, with no hemodynamically significant stenosis by duplex criteria in the extracranial cerebrovascular circulation. Signed, Yvone Neu. Reyne Dumas, RPVI [facility] Electronically Signed   By: Gilmer Mor D.O.   On: 04/20/2018 08:42   Dg Chest Port 1 View  Result Date: 04/19/2018 CLINICAL DATA:  CVA. EXAM: PORTABLE CHEST 1 VIEW COMPARISON:  03/19/2018 FINDINGS: Mild cardiomegaly improved from prior. Unchanged mediastinal contours. Improved bibasilar aeration with resolved pleural effusion and bibasilar opacities. No acute airspace disease. No pneumothorax or pulmonary edema. IMPRESSION: 1. No acute chest findings. 2. Improved lung aeration from February. Improvement in cardiomegaly with mild residual. Electronically Signed   By: Narda Rutherford M.D.   On: 04/19/2018 22:01     ASSESSMENT AND PLAN:   *Acute left frontal ischemic cerebrovascular accident Noted history of A. fib, chronic left M2 stenosis, recent craniotomy for chronic subdural hematoma which is still present with associated shift. Neurology consulted and appreciate input Repeat MRI ordered and findings seem to have changes secondary to venous congestion and not acute infarct. Started on Eliquis by neurology.  Discussed with Dr. Emmaline Life was discussed extensively with Dr. Candida Peeling neurologist) who had taken care of patient at [facility] recently.   *History of recent craniotomy for subdural hematoma Noted chronic subdural hematoma on the left 2 mm with left-to-right shift Conservative medical management, no anticoagulation.  *History of A. Fib Continue rate control medications.  Eliquis ordered by neurology today.  *Chronic seizure disorder Depakote, Vimpat, Keppra  *Chronic deconditioning Physical therapy to evaluate  All the records are reviewed and case discussed with Care  Management/Social Worker Management plans discussed with the patient, family and they are in agreement.  CODE STATUS: FULL CODE  DVT Prophylaxis: SCDs  TOTAL TIME TAKING CARE OF THIS PATIENT: 35 minutes.   Discharge to skilled nursing facility once bed available.  Discussed with social work.  Molinda Bailiff Haydn Hutsell M.D on 04/21/2018 at 1:04 PM  Between 7am to 6pm - Pager - [phone]  After  6pm go to [email or web address] - password [email or web address]  [facility]  Office  [phone]  CC: Primary care physician; Cain Sieve, MD  Note: This dictation was prepared with Dragon dictation along with smaller phrase technology. Any transcriptional errors that result from this process are unintentional.

## 2018-04-21 NOTE — Evaluation (Signed)
Occupational Therapy Evaluation Patient Details Name: Alvin Hudson MRN: [id number] DOB: 1936/01/15 Today's Date: 04/21/2018    History of Present Illness Pt. is an 83 y.o. male with a known history to include craniectomy for left subdural hematoma evacuation in February 2020, seizure disorder, history of A. fib, , high-grade left M2 stenosis, presenting with left arm weakness, problems with speech, speech arrest, falls, difficulty ambulating, generalized weakness, fatigue. Work up showed left frontal subdural hematoma, acute L frontal CVA.   Clinical Impression   Pt. presents with weakness, limited activity tolerance, impaired cognition and limited functional mobility which hinders his ability to complete basic ADL and IADL functioning safely. Pt. resides with his wife. Pt. has recently been discharged from inpatient rehab. Pt. requires cues for motor planning during ADL tasks. Pt. could benefit from OT services for ADL training, neuro-muscular re-education, A/E training, and pt. education about cognitive compensatory strategies during ADLs, home modification, and DME. Pt. could benefit from follow-up HHOT services upon discharge.   Follow Up Recommendations  HHOT, Supervision/Assistance - 24 hour,    Equipment Recommendations       Recommendations for Other Services  PT/ST     Precautions / Restrictions Precautions Precautions: Fall Restrictions Weight Bearing Restrictions: No      Mobility                   Transfers Overall transfer level: Needs assistance Equipment used: 2 person hand held assist Transfers: Sit to/from Stand Sit to Stand: Min guard         General transfer comment: Mobility per PT    Balance                                           ADL either performed or assessed with clinical judgement   ADL Overall ADL's : Needs assistance/impaired Eating/Feeding: Set up;Independent   Grooming: Minimal assistance   Upper Body  Bathing: Set up;Minimal assistance   Lower Body Bathing: Set up;Moderate assistance   Upper Body Dressing : Set up;Minimal assistance   Lower Body Dressing: Set up;Moderate assistance   Toilet Transfer: Min guard                   Vision Baseline Vision/History: Wears glasses Wears Glasses: Distance only Patient Visual Report: No change from baseline(Per pt. report)       Perception     Praxis      Pertinent Vitals/Pain Pain Assessment: No/denies pain     Hand Dominance Right   Extremity/Trunk Assessment Upper Extremity Assessment RUE Deficits / Details: grossly 4-/5 LUE Deficits / Details: grossly 4-/5    Unable to accurately assess senation/proprioceptive awareness secondary to cognition.       Communication Communication Communication: Expressive difficulties;HOH   Cognition Arousal/Alertness: Awake/alert Behavior During Therapy: WFL for tasks assessed/performed Overall Cognitive Status: No family/caregiver present to determine baseline cognitive functioning Area of Impairment: Memory;Following commands;Problem solving;Safety/judgement                     Memory: Decreased short-term memory Following Commands: Follows one step commands consistently Safety/Judgement: Decreased awareness of safety   Problem Solving: Slow processing;Difficulty sequencing;Requires verbal cues;Requires tactile cues     General Comments       Exercises     Shoulder Instructions      Home Living Family/patient expects to be discharged to:: Private residence  Living Arrangements: Spouse/significant other Available Help at Discharge: Family;Available 24 hours/day Type of Home: House Home Access: Stairs to enter [employer] of Steps: 4 Entrance Stairs-Rails: Left;Right Home Layout: Two level;Able to live on main level with bedroom/bathroom Alternate Level Stairs-Number of Steps: flight   Bathroom Shower/Tub: Chief Strategy Officer:  Standard Bathroom Accessibility: Yes How Accessible: Accessible via walker Home Equipment: Walker - 2 wheels      Lives With: Spouse    Prior Functioning/Environment Level of Independence: Independent        Comments: Per chart review, pt was independent. Recently discharged from inpatient rehab, pt having difficulty verbalzing level of assistance needed for ADLs/IADLs        OT Problem List: Decreased strength;Decreased activity tolerance;Pain;Impaired UE functional use;Decreased knowledge of use of DME or AE;Decreased coordination      OT Treatment/Interventions: Self-care/ADL training;Therapeutic exercise;Patient/family education;Therapeutic activities;Neuromuscular education;DME and/or AE instruction    OT Goals(Current goals can be found in the care plan section) Acute Rehab OT Goals OT Goal Formulation: Patient unable to participate in goal setting  OT Frequency: Min 2X/week   Barriers to D/C:            Co-evaluation              AM-PAC OT "6 Clicks" Daily Activity     Outcome Measure Help from another person eating meals?: A Little   Help from another person toileting, which includes using toliet, bedpan, or urinal?: A Little Help from another person bathing (including washing, rinsing, drying)?: A Lot Help from another person to put on and taking off regular upper body clothing?: A Little Help from another person to put on and taking off regular lower body clothing?: A Lot 6 Click Score: 13   End of Session    Activity Tolerance: Patient tolerated treatment well Patient left: in bed  OT Visit Diagnosis: Muscle weakness (generalized) (M62.81)                Time: 2094-7096 OT Time Calculation (min): 23 min Charges:  OT General Charges $OT Visit: 1 Visit OT Evaluation $OT Eval Low Complexity: 1 Low Olegario Messier, MS, OTR/L  Olegario Messier 04/21/2018, 2:19 PM

## 2018-04-22 MED ORDER — APIXABAN 5 MG PO TABS: 5.0000 mg | ORAL_TABLET | Freq: Two times a day (BID) | ORAL | Status: AC

## 2018-04-22 MED ORDER — ATORVASTATIN CALCIUM 40 MG PO TABS: 40.0000 mg | ORAL_TABLET | Freq: Every day | ORAL | Status: AC

## 2018-04-22 MED ORDER — LEVETIRACETAM 750 MG PO TABS: 1500.0000 mg | ORAL_TABLET | Freq: Two times a day (BID) | ORAL | Status: AC

## 2018-04-22 MED ORDER — ZONISAMIDE 100 MG PO CAPS: 100.0000 mg | ORAL_CAPSULE | Freq: Every day | ORAL | Status: AC

## 2018-04-22 MED ORDER — ACETAMINOPHEN 325 MG PO TABS: 650.0000 mg | ORAL_TABLET | ORAL | Status: DC | PRN

## 2018-04-22 NOTE — Discharge Summary (Signed)
[facility] -  at [facility]   PATIENT NAME: Alvin Hudson    MR#:  [id number]  DATE OF BIRTH:  12-Jul-1935  DATE OF ADMISSION:  04/19/2018 ADMITTING PHYSICIAN: Bertrum Sol, MD  DATE OF DISCHARGE: 04/22/2018 PRIMARY CARE PHYSICIAN: Cain Sieve, MD    ADMISSION DIAGNOSIS:  Frontal lobe and executive function deficit following cerebral infarction [I69.314]  DISCHARGE DIAGNOSIS:  Aphasia   SECONDARY DIAGNOSIS:   Past Medical History:  Diagnosis Date  . Allergy   . Seizures [facility])     HOSPITAL COURSE:  83 year old male with past medical history significant for seizure, atrial fibrillation, subdural hematoma status post evacuation who presented to the emergency room due to a aphasia.  1.  Aphasia: Patient with evaluated for new acute CVA.   Patient underwent MRI brain.  Findings are consistent with venous congestion or an infarct that occurred closer to previous hospitalizations.  Patient did not have an acute stroke as per my conversation with neurology.  It is likely that this is a subacute CVA.  Patient continues to have a aphasia.  Acute hemorrhage from previous hospitalization has resolved.  2.  Subacute/older CVA during last hospitalization: Recommendations are to start Eliquis due to history of atrial fibrillation. He will continue on statin. Patient will follow-up with Dr. Sherryll Burger from neurology.  3.  PAF: Patient will continue Eliquis 5 mg p.o. twice daily  4.  Seizure disorder: Patient will continue lacosamide 200 mg twice daily, Keppra 1500 mg twice daily, Depakote 500 mg 3 times daily and Zonegran 100 mg daily which was added during this hospitalization as per neurology. Patient is unable to drive, her operate heavy machinery, perform activities at high and participate in water activities until released by outpatient physician.   Patient would benefit from outpatient palliative care services.   DISCHARGE CONDITIONS AND DIET:  stAble  for discharge with outpatient follow-up Regular diet as tolerated  CONSULTS OBTAINED:  Treatment Team:  Kym Groom, MD Thana Farr, MD  DRUG ALLERGIES:   Allergies  Allergen Reactions  . Aspirin Other (See Comments)    Caused ulcers  . Naproxen Other (See Comments)    Caused ulcers    DISCHARGE MEDICATIONS:   Allergies as of 04/22/2018      Reactions   Aspirin Other (See Comments)   Caused ulcers   Naproxen Other (See Comments)   Caused ulcers      Medication List    STOP taking these medications   HYDROcodone-acetaminophen 5-325 MG tablet Commonly known as:  NORCO/VICODIN   levETIRAcetam 100 MG/ML solution Commonly known as:  KEPPRA Replaced by:  levETIRAcetam 750 MG tablet     TAKE these medications   acetaminophen 325 MG tablet Commonly known as:  TYLENOL Take 2 tablets (650 mg total) by mouth every 4 (four) hours as needed for mild pain (temp > 100.5). What changed:  Another medication with the same name was added. Make sure you understand how and when to take each.   acetaminophen 325 MG tablet Commonly known as:  TYLENOL Take 2 tablets (650 mg total) by mouth every 4 (four) hours as needed for mild pain (or temp > 37.5 C (99.5 F)). What changed:  You were already taking a medication with the same name, and this prescription was added. Make sure you understand how and when to take each.   apixaban 5 MG Tabs tablet Commonly known as:  ELIQUIS Take 1 tablet (5 mg total) by mouth 2 (two) times daily.  atorvastatin 40 MG tablet Commonly known as:  LIPITOR Take 1 tablet (40 mg total) by mouth daily. What changed:    medication strength  how much to take   B complex-vitamin C-folic acid 1 MG tablet Take 1 tablet by mouth daily.   diltiazem 240 MG 24 hr capsule Commonly known as:  CARDIZEM CD Take 1 capsule (240 mg total) by mouth daily.   divalproex 500 MG DR tablet Commonly known as:  DEPAKOTE Take 1 tablet (500 mg total) by mouth  every 8 (eight) hours.   docusate sodium 100 MG capsule Commonly known as:  COLACE Take 1 capsule (100 mg total) by mouth 2 (two) times daily.   FiberCon 625 MG tablet Generic drug:  polycarbophil Take 625 mg by mouth daily.   lacosamide 200 MG Tabs tablet Commonly known as:  VIMPAT Take 1 tablet (200 mg total) by mouth 2 (two) times daily.   levETIRAcetam 750 MG tablet Commonly known as:  KEPPRA Take 2 tablets (1,500 mg total) by mouth 2 (two) times daily. Replaces:  levETIRAcetam 100 MG/ML solution   loratadine 10 MG tablet Commonly known as:  CLARITIN Take 1 tablet (10 mg total) by mouth daily.   polyethylene glycol packet Commonly known as:  MIRALAX / GLYCOLAX Take 17 g by mouth daily.   senna-docusate 8.6-50 MG tablet Commonly known as:  Senokot-S Take 2 tablets by mouth at bedtime.   sertraline 100 MG tablet Commonly known as:  ZOLOFT Take 1 tablet (100 mg total) by mouth daily.   zonisamide 100 MG capsule Commonly known as:  ZONEGRAN Take 1 capsule (100 mg total) by mouth daily.         Today   CHIEF COMPLAINT:   No acute events overnight   VITAL SIGNS:  Blood pressure (!) 152/74, pulse 60, temperature 99.1 F (37.3 C), temperature source Oral, resp. rate 18, height 5\' 6"  (1.676 m), weight 67.2 kg, SpO2 96 %.   REVIEW OF SYSTEMS:  Review of Systems  Unable to perform ROS: Acuity of condition     PHYSICAL EXAMINATION:  GENERAL:  83 y.o.-year-old patient lying in the bed with no acute distress.  NECK:  Supple, no jugular venous distention. No thyroid enlargement, no tenderness.  LUNGS: Normal breath sounds bilaterally, no wheezing, rales,rhonchi  No use of accessory muscles of respiration.  CARDIOVASCULAR: S1, S2 normal. No murmurs, rubs, or gallops.  ABDOMEN: Soft, non-tender, non-distended. Bowel sounds present. No organomegaly or mass.  EXTREMITIES: No pedal edema, cyanosis, or clubbing.  PSYCHIATRIC: The patient is alert and oriented x 3.   SKIN: No obvious rash, lesion, or ulcer.  Patient is aphasic DATA REVIEW:   CBC Recent Labs  Lab 04/19/18 1537  WBC 6.9  HGB 12.2*  HCT 38.0*  PLT 162    Chemistries  Recent Labs  Lab 04/19/18 1537  NA 136  K 4.3  CL 100  CO2 29  GLUCOSE 121*  BUN 11  CREATININE 0.83  CALCIUM 8.8*  AST 31  ALT 21  ALKPHOS 53  BILITOT 0.8    Cardiac Enzymes Recent Labs  Lab 04/19/18 1537  TROPONINI <0.03    Microbiology Results  @[email or web address] @  RADIOLOGY:  Mr Laqueta Jean Contrast  Result Date: 04/20/2018 CLINICAL DATA:  Left anterior frontal lobe abnormality favored to represent stroke. Tumor not excluded. Contrast study requested. EXAM: MRI HEAD WITH CONTRAST TECHNIQUE: Multiplanar, multiecho pulse sequences of the brain and surrounding structures were obtained with intravenous contrast. CONTRAST:  6 cc Gadavist  COMPARISON:  MRI and CT done yesterday.  MRI 03/13/2018. FINDINGS: Brain: After contrast administration, there is no abnormal enhancement in the left frontal lobe in the region of cortical and subcortical vasogenic edema. This appears very similar on the coronal T2 imaging compared yesterday. Left convexity residual subdural material is unchanged, maximal thickness 9 mm. Expected dural enhancement on the left is noted. I think this appearance could well represent some edema secondary to superficial venous thrombosis or congestion. Vascular: Major vessels at the base of the brain show flow. Skull and upper cervical spine: Previous left sciatic craniotomy. Sinuses/Orbits: Mild seasonal mucosal thickening.  Orbits negative. Other: None IMPRESSION: After contrast administration, there is no abnormal enhancement in the region of cortical and subcortical edema in the left posterior frontal region. There is overlying dural enhancement and there is no change in residual left convexity subdural material, maximal thickness 9 mm. I think this study excludes the presence of an underlying mass.  The differential diagnosis include cerebritis and edema secondary to superficial venous thrombosis or congestion without true infarction. Venous disease is the favored explanation. Electronically Signed   By: Paulina Fusi M.D.   On: 04/20/2018 15:37      Allergies as of 04/22/2018      Reactions   Aspirin Other (See Comments)   Caused ulcers   Naproxen Other (See Comments)   Caused ulcers      Medication List    STOP taking these medications   HYDROcodone-acetaminophen 5-325 MG tablet Commonly known as:  NORCO/VICODIN   levETIRAcetam 100 MG/ML solution Commonly known as:  KEPPRA Replaced by:  levETIRAcetam 750 MG tablet     TAKE these medications   acetaminophen 325 MG tablet Commonly known as:  TYLENOL Take 2 tablets (650 mg total) by mouth every 4 (four) hours as needed for mild pain (temp > 100.5). What changed:  Another medication with the same name was added. Make sure you understand how and when to take each.   acetaminophen 325 MG tablet Commonly known as:  TYLENOL Take 2 tablets (650 mg total) by mouth every 4 (four) hours as needed for mild pain (or temp > 37.5 C (99.5 F)). What changed:  You were already taking a medication with the same name, and this prescription was added. Make sure you understand how and when to take each.   apixaban 5 MG Tabs tablet Commonly known as:  ELIQUIS Take 1 tablet (5 mg total) by mouth 2 (two) times daily.   atorvastatin 40 MG tablet Commonly known as:  LIPITOR Take 1 tablet (40 mg total) by mouth daily. What changed:    medication strength  how much to take   B complex-vitamin C-folic acid 1 MG tablet Take 1 tablet by mouth daily.   diltiazem 240 MG 24 hr capsule Commonly known as:  CARDIZEM CD Take 1 capsule (240 mg total) by mouth daily.   divalproex 500 MG DR tablet Commonly known as:  DEPAKOTE Take 1 tablet (500 mg total) by mouth every 8 (eight) hours.   docusate sodium 100 MG capsule Commonly known as:   COLACE Take 1 capsule (100 mg total) by mouth 2 (two) times daily.   FiberCon 625 MG tablet Generic drug:  polycarbophil Take 625 mg by mouth daily.   lacosamide 200 MG Tabs tablet Commonly known as:  VIMPAT Take 1 tablet (200 mg total) by mouth 2 (two) times daily.   levETIRAcetam 750 MG tablet Commonly known as:  KEPPRA Take 2 tablets (1,500  mg total) by mouth 2 (two) times daily. Replaces:  levETIRAcetam 100 MG/ML solution   loratadine 10 MG tablet Commonly known as:  CLARITIN Take 1 tablet (10 mg total) by mouth daily.   polyethylene glycol packet Commonly known as:  MIRALAX / GLYCOLAX Take 17 g by mouth daily.   senna-docusate 8.6-50 MG tablet Commonly known as:  Senokot-S Take 2 tablets by mouth at bedtime.   sertraline 100 MG tablet Commonly known as:  ZOLOFT Take 1 tablet (100 mg total) by mouth daily.   zonisamide 100 MG capsule Commonly known as:  ZONEGRAN Take 1 capsule (100 mg total) by mouth daily.            Stable for discharge   Patient should follow up with neurology  CODE STATUS:     Code Status Orders  (From admission, onward)         Start     Ordered   04/19/18 2018  Do not attempt resuscitation (DNR)  Continuous    Question Answer Comment  In the event of cardiac or respiratory ARREST Do not call a "code blue"   In the event of cardiac or respiratory ARREST Do not perform Intubation, CPR, defibrillation or ACLS   In the event of cardiac or respiratory ARREST Use medication by any route, position, wound care, and other measures to relive pain and suffering. May use oxygen, suction and manual treatment of airway obstruction as needed for comfort.   Comments Nurse may pronounce      04/19/18 2017        Code Status History    Date Active Date Inactive Code Status Order ID Comments User Context   03/20/2018 1603 04/11/2018 1503 Full Code [id number]  Lynnae Prudengiulli, Daniel J, PA-C Inpatient   03/20/2018 1603 03/20/2018 1603 Full Code  [id number]  Lynnae Prudengiulli, Daniel J, PA-C Inpatient   03/12/2018 2320 03/20/2018 1551 Full Code [id number]  Jadene Pierinistergard, Thomas A, MD Inpatient   03/10/2018 1959 03/12/2018 1605 Full Code [id number]  Tressie StalkerJenkins, Jeffrey, MD Inpatient    Advance Directive Documentation     Most Recent Value  Type of Advance Directive  Healthcare Power of Attorney, Living will  Pre-existing out of facility DNR order (yellow form or pink MOST form)  -  "MOST" Form in Place?  -      TOTAL TIME TAKING CARE OF THIS PATIENT: 39 minutes.    Note: This dictation was prepared with Dragon dictation along with smaller phrase technology. Any transcriptional errors that result from this process are unintentional.  Adrian SaranSital Bionca Mckey M.D on 04/22/2018 at 9:45 AM  Between 7am to 6pm - Pager - [phone] After 6pm go to [email or web address] - Social research officer, governmentpassword [email or web address]  [facility]  Office  [phone]  CC: Primary care physician; Cain SieveKlipstein, Christopher, MD

## 2018-04-22 NOTE — Plan of Care (Signed)
  Problem: Education: Goal: Knowledge of General Education information will improve Description Including pain rating scale, medication(s)/side effects and non-pharmacologic comfort measures Outcome: Progressing   Problem: Health Behavior/Discharge Planning: Goal: Ability to manage health-related needs will improve Outcome: Progressing   Problem: Clinical Measurements: Goal: Ability to maintain clinical measurements within normal limits will improve Outcome: Progressing Goal: Will remain free from infection Outcome: Progressing Goal: Diagnostic test results will improve Outcome: Progressing Goal: Respiratory complications will improve Outcome: Progressing Goal: Cardiovascular complication will be avoided Outcome: Progressing   Problem: Activity: Goal: Risk for activity intolerance will decrease Outcome: Progressing   Problem: Nutrition: Goal: Adequate nutrition will be maintained Outcome: Progressing   Problem: Coping: Goal: Level of anxiety will decrease Outcome: Progressing   Problem: Elimination: Goal: Will not experience complications related to bowel motility Outcome: Progressing Goal: Will not experience complications related to urinary retention Outcome: Progressing   Problem: Pain Managment: Goal: General experience of comfort will improve Outcome: Progressing   Problem: Safety: Goal: Ability to remain free from injury will improve Outcome: Progressing   Problem: Skin Integrity: Goal: Risk for impaired skin integrity will decrease Outcome: Progressing   Problem: Education: Goal: Knowledge of disease or condition will improve Outcome: Progressing Goal: Knowledge of secondary prevention will improve Outcome: Progressing Goal: Knowledge of patient specific risk factors addressed and post discharge goals established will improve Outcome: Progressing Goal: Individualized Educational Video(s) Outcome: Progressing   Problem: Coping: Goal: Will identify  appropriate support needs Outcome: Progressing   Problem: Health Behavior/Discharge Planning: Goal: Ability to manage health-related needs will improve Outcome: Progressing   Problem: Self-Care: Goal: Ability to participate in self-care as condition permits will improve Outcome: Progressing Goal: Verbalization of feelings and concerns over difficulty with self-care will improve Outcome: Progressing Goal: Ability to communicate needs accurately will improve Outcome: Progressing   Problem: Nutrition: Goal: Risk of aspiration will decrease Outcome: Progressing Goal: Dietary intake will improve Outcome: Progressing   Problem: Intracerebral Hemorrhage Tissue Perfusion: Goal: Complications of Intracerebral Hemorrhage will be minimized Outcome: Progressing   Problem: Ischemic Stroke/TIA Tissue Perfusion: Goal: Complications of ischemic stroke/TIA will be minimized Outcome: Progressing   Problem: Spontaneous Subarachnoid Hemorrhage Tissue Perfusion: Goal: Complications of Spontaneous Subarachnoid Hemorrhage will be minimized Outcome: Progressing

## 2018-04-22 NOTE — Plan of Care (Signed)
Problem: Education: Goal: Knowledge of General Education information will improve Description Including pain rating scale, medication(s)/side effects and non-pharmacologic comfort measures 04/22/2018 0321 by Donnel Saxon, RN Outcome: Progressing 04/22/2018 0317 by Donnel Saxon, RN Outcome: Progressing   Problem: Health Behavior/Discharge Planning: Goal: Ability to manage health-related needs will improve 04/22/2018 0321 by Donnel Saxon, RN Outcome: Progressing 04/22/2018 0317 by Donnel Saxon, RN Outcome: Progressing   Problem: Clinical Measurements: Goal: Ability to maintain clinical measurements within normal limits will improve 04/22/2018 0321 by Donnel Saxon, RN Outcome: Progressing 04/22/2018 0317 by Donnel Saxon, RN Outcome: Progressing Goal: Will remain free from infection 04/22/2018 0321 by Donnel Saxon, RN Outcome: Progressing 04/22/2018 0317 by Donnel Saxon, RN Outcome: Progressing Goal: Diagnostic test results will improve 04/22/2018 0321 by Donnel Saxon, RN Outcome: Progressing 04/22/2018 0317 by Donnel Saxon, RN Outcome: Progressing Goal: Respiratory complications will improve 04/22/2018 0321 by Donnel Saxon, RN Outcome: Progressing 04/22/2018 0317 by Donnel Saxon, RN Outcome: Progressing Goal: Cardiovascular complication will be avoided 04/22/2018 0321 by Donnel Saxon, RN Outcome: Progressing 04/22/2018 0317 by Donnel Saxon, RN Outcome: Progressing   Problem: Activity: Goal: Risk for activity intolerance will decrease 04/22/2018 0321 by Donnel Saxon, RN Outcome: Progressing 04/22/2018 0317 by Donnel Saxon, RN Outcome: Progressing   Problem: Nutrition: Goal: Adequate nutrition will be maintained 04/22/2018 0321 by Donnel Saxon, RN Outcome: Progressing 04/22/2018 0317 by Donnel Saxon, RN Outcome: Progressing   Problem:  Coping: Goal: Level of anxiety will decrease 04/22/2018 0321 by Donnel Saxon, RN Outcome: Progressing 04/22/2018 0317 by Donnel Saxon, RN Outcome: Progressing   Problem: Elimination: Goal: Will not experience complications related to bowel motility 04/22/2018 0321 by Donnel Saxon, RN Outcome: Progressing 04/22/2018 0317 by Donnel Saxon, RN Outcome: Progressing Goal: Will not experience complications related to urinary retention 04/22/2018 0321 by Donnel Saxon, RN Outcome: Progressing 04/22/2018 0317 by Donnel Saxon, RN Outcome: Progressing   Problem: Pain Managment: Goal: General experience of comfort will improve 04/22/2018 0321 by Donnel Saxon, RN Outcome: Progressing 04/22/2018 0317 by Donnel Saxon, RN Outcome: Progressing   Problem: Safety: Goal: Ability to remain free from injury will improve 04/22/2018 0321 by Donnel Saxon, RN Outcome: Progressing 04/22/2018 0317 by Donnel Saxon, RN Outcome: Progressing   Problem: Skin Integrity: Goal: Risk for impaired skin integrity will decrease 04/22/2018 0321 by Donnel Saxon, RN Outcome: Progressing 04/22/2018 0317 by Donnel Saxon, RN Outcome: Progressing   Problem: Education: Goal: Knowledge of disease or condition will improve 04/22/2018 0321 by Donnel Saxon, RN Outcome: Progressing 04/22/2018 0317 by Donnel Saxon, RN Outcome: Progressing Goal: Knowledge of secondary prevention will improve 04/22/2018 0321 by Donnel Saxon, RN Outcome: Progressing 04/22/2018 0317 by Donnel Saxon, RN Outcome: Progressing Goal: Knowledge of patient specific risk factors addressed and post discharge goals established will improve 04/22/2018 0321 by Donnel Saxon, RN Outcome: Progressing 04/22/2018 0317 by Donnel Saxon, RN Outcome: Progressing Goal: Individualized Educational Video(s) 04/22/2018 0321 by Donnel Saxon, RN Outcome: Progressing 04/22/2018 0317 by Donnel Saxon, RN Outcome: Progressing   Problem: Coping: Goal: Will identify appropriate support needs 04/22/2018 0321 by Donnel Saxon, RN Outcome: Progressing 04/22/2018 0317 by Donnel Saxon, RN Outcome: Progressing   Problem: Health Behavior/Discharge Planning: Goal: Ability to manage health-related needs will improve 04/22/2018 0321 by Donnel Saxon, RN Outcome: Progressing 04/22/2018 [phone]  by Donnel Saxon, RN Outcome: Progressing   Problem: Self-Care: Goal: Ability to participate in self-care as condition permits will improve 04/22/2018 0321 by Donnel Saxon, RN Outcome: Progressing 04/22/2018 0317 by Donnel Saxon, RN Outcome: Progressing Goal: Verbalization of feelings and concerns over difficulty with self-care will improve 04/22/2018 0321 by Donnel Saxon, RN Outcome: Progressing 04/22/2018 0317 by Donnel Saxon, RN Outcome: Progressing Goal: Ability to communicate needs accurately will improve 04/22/2018 0321 by Donnel Saxon, RN Outcome: Progressing 04/22/2018 0317 by Donnel Saxon, RN Outcome: Progressing   Problem: Nutrition: Goal: Risk of aspiration will decrease 04/22/2018 0321 by Donnel Saxon, RN Outcome: Progressing 04/22/2018 0317 by Donnel Saxon, RN Outcome: Progressing Goal: Dietary intake will improve 04/22/2018 0321 by Donnel Saxon, RN Outcome: Progressing 04/22/2018 0317 by Donnel Saxon, RN Outcome: Progressing   Problem: Intracerebral Hemorrhage Tissue Perfusion: Goal: Complications of Intracerebral Hemorrhage will be minimized 04/22/2018 0321 by Donnel Saxon, RN Outcome: Progressing 04/22/2018 0317 by Donnel Saxon, RN Outcome: Progressing   Problem: Ischemic Stroke/TIA Tissue Perfusion: Goal: Complications of ischemic stroke/TIA will be minimized 04/22/2018 0321 by Donnel Saxon, RN Outcome: Progressing 04/22/2018 0317 by Donnel Saxon, RN Outcome: Progressing   Problem: Spontaneous Subarachnoid Hemorrhage Tissue Perfusion: Goal: Complications of Spontaneous Subarachnoid Hemorrhage will be minimized 04/22/2018 0321 by Donnel Saxon, RN Outcome: Progressing 04/22/2018 0317 by Donnel Saxon, RN Outcome: Progressing

## 2018-04-22 NOTE — Progress Notes (Signed)
Subjective: Patient resting but easily awakened.  No new neurological complaints.    Objective: Current vital signs: BP (!) 152/74 (BP Location: Left Arm)   Pulse 60   Temp 99.1 F (37.3 C) (Oral)   Resp 18   Ht 5\' 6"  (1.676 m)   Wt 67.2 kg   SpO2 96%   BMI 23.90 kg/m  Vital signs in last 24 hours: Temp:  [98.2 F (36.8 C)-99.1 F (37.3 C)] 99.1 F (37.3 C) (03/28 0657) Pulse Rate:  [60-70] 60 (03/28 0657) Resp:  [18-20] 18 (03/28 0657) BP: (115-165)/(74-84) 152/74 (03/28 0657) SpO2:  [96 %-98 %] 96 % (03/28 0657)  Intake/Output from previous day: 03/27 0701 - 03/28 0700 In: 1024.4 [P.O.:480; I.V.:544.4] Out: -  Intake/Output this shift: No intake/output data recorded. Nutritional status:  Diet Order            Diet regular Room service appropriate? Yes; Fluid consistency: Thin  Diet effective now              Neurologic Exam: Mental Status: Easily awakened.  Remains aphasic. Able to follow some commands but a times requiredprompting and cueing. Cranial Nerves: VO:HYWVPX to bilateral confrontation, pupils equal, round, reactive to light and accommodation III,IV, VI: ptosis not present, extra-ocular motions intact bilaterally V,VII: smile symmetric, facial light touch sensationintact VIII: hearing normal bilaterally IX,X: gag reflex present XI: bilateral shoulder shrug XII: midline tongue extension Motor: 5/5 throughout with no focal weakness noted   Lab Results: Basic Metabolic Panel: Recent Labs  Lab 04/19/18 1537  NA 136  K 4.3  CL 100  CO2 29  GLUCOSE 121*  BUN 11  CREATININE 0.83  CALCIUM 8.8*    Liver Function Tests: Recent Labs  Lab 04/19/18 1537  AST 31  ALT 21  ALKPHOS 53  BILITOT 0.8  PROT 6.5  ALBUMIN 3.4*   No results for input(s): LIPASE, AMYLASE in the last 168 hours. No results for input(s): AMMONIA in the last 168 hours.  CBC: Recent Labs  Lab 04/19/18 1537  WBC 6.9  NEUTROABS 3.7  HGB 12.2*  HCT 38.0*   MCV 111.1*  PLT 162    Cardiac Enzymes: Recent Labs  Lab 04/19/18 1537  TROPONINI <0.03    Lipid Panel: Recent Labs  Lab 04/20/18 0337  CHOL 108  TRIG 73  HDL 34*  CHOLHDL 3.2  VLDL 15  LDLCALC 59    CBG: No results for input(s): GLUCAP in the last 168 hours.  Microbiology: Results for orders placed or performed during the hospital encounter of 03/12/18  Culture, blood (Routine X 2) w Reflex to ID Panel     Status: None   Collection Time: 03/13/18  2:35 PM  Result Value Ref Range Status   Specimen Description BLOOD RIGHT ANTECUBITAL  Final   Special Requests   Final    BOTTLES DRAWN AEROBIC AND ANAEROBIC Blood Culture adequate volume Performed at [facility], [address], [place], [place] [address]    Culture NO GROWTH 5 DAYS  Final   Report Status 03/18/2018 FINAL  Final  Culture, blood (Routine X 2) w Reflex to ID Panel     Status: None   Collection Time: 03/13/18  2:45 PM  Result Value Ref Range Status   Specimen Description BLOOD RIGHT HAND  Final   Special Requests   Final    BOTTLES DRAWN AEROBIC ONLY Blood Culture results may not be optimal due to an inadequate volume of blood received in culture  bottles Performed at [facility], [address], [place], [place] [address]    Culture NO GROWTH 5 DAYS  Final   Report Status 03/18/2018 FINAL  Final    Coagulation Studies: No results for input(s): LABPROT, INR in the last 72 hours.  Imaging: Mr Laqueta Jean Contrast  Result Date: 04/20/2018 CLINICAL DATA:  Left anterior frontal lobe abnormality favored to represent stroke. Tumor not excluded. Contrast study requested. EXAM: MRI HEAD WITH CONTRAST TECHNIQUE: Multiplanar, multiecho pulse sequences of the brain and surrounding structures were obtained with intravenous contrast. CONTRAST:  6 cc Gadavist COMPARISON:  MRI and CT done yesterday.  MRI 03/13/2018. FINDINGS: Brain: After contrast administration, there is no abnormal enhancement in the  left frontal lobe in the region of cortical and subcortical vasogenic edema. This appears very similar on the coronal T2 imaging compared yesterday. Left convexity residual subdural material is unchanged, maximal thickness 9 mm. Expected dural enhancement on the left is noted. I think this appearance could well represent some edema secondary to superficial venous thrombosis or congestion. Vascular: Major vessels at the base of the brain show flow. Skull and upper cervical spine: Previous left sciatic craniotomy. Sinuses/Orbits: Mild seasonal mucosal thickening.  Orbits negative. Other: None IMPRESSION: After contrast administration, there is no abnormal enhancement in the region of cortical and subcortical edema in the left posterior frontal region. There is overlying dural enhancement and there is no change in residual left convexity subdural material, maximal thickness 9 mm. I think this study excludes the presence of an underlying mass. The differential diagnosis include cerebritis and edema secondary to superficial venous thrombosis or congestion without true infarction. Venous disease is the favored explanation. Electronically Signed   By: Paulina Fusi M.D.   On: 04/20/2018 15:37    Medications:  I have reviewed the patient's current medications. Scheduled: . apixaban  5 mg Oral BID  . atorvastatin  40 mg Oral Daily  . diltiazem  240 mg Oral Daily  . divalproex  500 mg Oral Q8H  . docusate sodium  100 mg Oral BID  . lacosamide  200 mg Oral BID  . levETIRAcetam  1,500 mg Oral BID  . loratadine  10 mg Oral Daily  . multivitamin  1 tablet Oral Daily  . polycarbophil  625 mg Oral Daily  . polyethylene glycol  17 g Oral Daily  . senna-docusate  2 tablet Oral QHS  . sertraline  100 mg Oral Daily  . zonisamide  100 mg Oral Daily    Assessment/Plan: 83 year old male with aphasia.  MRI consistent with a subacute infarct.  Patient with recent history of seizures and SDH.  Patient has had some  intermittent symptoms therefore started on Zonegran and has been stable since admission.  Due to infarct and history of afib from last hospitalization patient started on Eliquis as well and has tolerated well.    Recommendations: 1. Patient to continue Eliquis 2. Patient to continue admission anticonvulsants and has been started on Zonegran which is to continue as well. 3. Patient to follow up with neurology on an outpatient basis.     LOS: 3 days   Thana Farr, MD Neurology [phone] 04/22/2018  12:22 PM

## 2018-04-22 NOTE — TOC Transition Note (Signed)
Transition of Care [facility]) - CM/SW Discharge Note   Patient Details  Name: Alvin Hudson MRN: [id number] Date of Birth: September 08, 1935  Transition [facility]) CM/SW Contact:  Judi Cong, LCSW Phone Number: 04/22/2018, 10:26 AM   Clinical Narrative:   The patient is medically stable to discharge to [facility] today. The CSW has completed the high risk readmission prevention screening. The patient's family would like to transport him to the facility; [facility] has confirmed that the family can transport but cannot enter the facility. A staff member will meet them outside to assist with transfer from POV to [facility]. The CSW updated [name] as to what items to bring for his rehab stay. She reported no further questions.  The CSW has sent all discharge documentation to the facility and has delivered the discharge packet. The TOC is signing off. Please consult should needs arise.    Final next level of care: Skilled Nursing Facility Barriers to Discharge: No Barriers Identified   Patient Goals and CMS Choice Patient states their goals for this hospitalization and ongoing recovery are:: "We want him to be able to transfer and walk at least 10 feet." (Patient's spouse) CMS Medicare.gov Compare Post Acute Care list provided to:: Patient Represenative (must comment)(Spouse: Alvin Hudson) Choice offered to / list presented to : Spouse  Discharge Placement   Existing PASRR number confirmed : 04/22/18          Patient chooses bed at: [facility] [place] Patient to be transferred to facility by: Family transport Name of family member notified: Alvin Hudson (Spouse) Patient and family notified of of transfer: 04/22/18  Discharge Plan and Services In-house Referral: (continued medical work up) Discharge Planning Services: CM Consult Post Acute Care Choice: NA          DME Arranged: Dan Humphreys rolling, Tub bench(Discharged from [facility] 04/11/18) DME Agency: [facility] HH Arranged: NA HH  Agency: [facility])   Social Determinants of Health (SDOH) Interventions     Readmission Risk Interventions Readmission Risk Prevention Plan 04/22/2018  Transportation Screening Complete  PCP or Specialist Appt within 3-5 Days Complete  HRI or Home Care Consult Not Complete  HRI or Home Care Consult comments Patient is discharging to SNF.  Social Work Consult for Recovery Care Planning/Counseling Complete  Palliative Care Screening Not Applicable  Medication Review Oceanographer) Complete

## 2018-04-24 ENCOUNTER — Ambulatory Visit: Payer: Medicare Other

## 2018-04-24 ENCOUNTER — Ambulatory Visit: Payer: Medicare Other | Admitting: Nurse Practitioner

## 2018-04-25 ENCOUNTER — Encounter: Payer: Medicare Other | Attending: Physical Medicine & Rehabilitation | Admitting: Physical Medicine & Rehabilitation

## 2018-04-27 ENCOUNTER — Encounter: Payer: Medicare Other | Admitting: Occupational Therapy

## 2018-04-27 ENCOUNTER — Ambulatory Visit: Payer: Medicare Other

## 2018-04-27 ENCOUNTER — Encounter: Payer: Medicare Other | Admitting: Speech Pathology

## 2018-07-03 ENCOUNTER — Ambulatory Visit: Payer: Self-pay | Admitting: Neurology

## 2018-07-03 ENCOUNTER — Inpatient Hospital Stay: Payer: Self-pay | Admitting: Adult Health

## 2018-08-30 DIAGNOSIS — F331 Major depressive disorder, recurrent, moderate: Secondary | ICD-10-CM

## 2018-08-30 DIAGNOSIS — I69391 Dysphagia following cerebral infarction: Secondary | ICD-10-CM

## 2018-08-30 DIAGNOSIS — Z9889 Other specified postprocedural states: Secondary | ICD-10-CM

## 2018-08-30 DIAGNOSIS — I421 Obstructive hypertrophic cardiomyopathy: Secondary | ICD-10-CM

## 2018-08-30 DIAGNOSIS — M47812 Spondylosis without myelopathy or radiculopathy, cervical region: Secondary | ICD-10-CM

## 2018-08-30 DIAGNOSIS — I959 Hypotension, unspecified: Secondary | ICD-10-CM

## 2018-08-30 DIAGNOSIS — I69351 Hemiplegia and hemiparesis following cerebral infarction affecting right dominant side: Secondary | ICD-10-CM

## 2018-08-30 DIAGNOSIS — E785 Hyperlipidemia, unspecified: Secondary | ICD-10-CM

## 2018-08-30 DIAGNOSIS — I69251 Hemiplegia and hemiparesis following other nontraumatic intracranial hemorrhage affecting right dominant side: Secondary | ICD-10-CM

## 2018-08-30 DIAGNOSIS — G4700 Insomnia, unspecified: Secondary | ICD-10-CM

## 2018-08-30 DIAGNOSIS — F419 Anxiety disorder, unspecified: Secondary | ICD-10-CM

## 2018-08-30 DIAGNOSIS — I69320 Aphasia following cerebral infarction: Secondary | ICD-10-CM

## 2018-08-30 DIAGNOSIS — J300 Vasomotor rhinitis: Secondary | ICD-10-CM

## 2018-08-30 HISTORY — DX: Hyperlipidemia, unspecified: E78.5

## 2018-08-30 HISTORY — DX: Anxiety disorder, unspecified: F41.9

## 2018-08-30 HISTORY — DX: Spondylosis without myelopathy or radiculopathy, cervical region: M47.812

## 2018-08-30 HISTORY — DX: Hypotension, unspecified: I95.9

## 2018-08-30 HISTORY — DX: Obstructive hypertrophic cardiomyopathy: I42.1

## 2018-08-30 HISTORY — DX: Hemiplegia and hemiparesis following cerebral infarction affecting right dominant side: I69.351

## 2018-08-30 HISTORY — DX: Major depressive disorder, recurrent, moderate: F33.1

## 2018-08-30 HISTORY — DX: Dysphagia following cerebral infarction: I69.391

## 2018-08-30 HISTORY — DX: Hemiplegia and hemiparesis following other nontraumatic intracranial hemorrhage affecting right dominant side: I69.251

## 2018-08-30 HISTORY — DX: Vasomotor rhinitis: J30.0

## 2018-08-30 HISTORY — DX: Insomnia, unspecified: G47.00

## 2018-08-30 HISTORY — DX: Aphasia following cerebral infarction: I69.320

## 2018-08-30 HISTORY — DX: Other specified postprocedural states: Z98.890

## 2018-08-31 DIAGNOSIS — I10 Essential (primary) hypertension: Secondary | ICD-10-CM

## 2018-08-31 DIAGNOSIS — K5901 Slow transit constipation: Secondary | ICD-10-CM

## 2018-08-31 HISTORY — DX: Essential (primary) hypertension: I10

## 2018-08-31 HISTORY — DX: Slow transit constipation: K59.01

## 2018-09-04 DIAGNOSIS — M6281 Muscle weakness (generalized): Secondary | ICD-10-CM

## 2018-09-04 DIAGNOSIS — I480 Paroxysmal atrial fibrillation: Secondary | ICD-10-CM

## 2018-09-04 DIAGNOSIS — I447 Left bundle-branch block, unspecified: Secondary | ICD-10-CM

## 2018-09-04 HISTORY — DX: Paroxysmal atrial fibrillation: I48.0

## 2018-09-04 HISTORY — DX: Muscle weakness (generalized): M62.81

## 2018-09-04 HISTORY — DX: Left bundle-branch block, unspecified: I44.7

## 2018-09-08 DIAGNOSIS — I69128 Other speech and language deficits following nontraumatic intracerebral hemorrhage: Secondary | ICD-10-CM

## 2018-09-08 DIAGNOSIS — R498 Other voice and resonance disorders: Secondary | ICD-10-CM

## 2018-09-08 HISTORY — DX: Other voice and resonance disorders: R49.8

## 2018-09-08 HISTORY — DX: Other speech and language deficits following nontraumatic intracerebral hemorrhage: I69.128

## 2018-09-11 DIAGNOSIS — I69918 Other symptoms and signs involving cognitive functions following unspecified cerebrovascular disease: Secondary | ICD-10-CM

## 2018-09-11 DIAGNOSIS — M25551 Pain in right hip: Secondary | ICD-10-CM

## 2018-09-11 HISTORY — DX: Other symptoms and signs involving cognitive functions following unspecified cerebrovascular disease: I69.918

## 2018-09-11 HISTORY — DX: Pain in right hip: M25.551

## 2018-09-14 DIAGNOSIS — S72001D Fracture of unspecified part of neck of right femur, subsequent encounter for closed fracture with routine healing: Secondary | ICD-10-CM

## 2018-09-14 HISTORY — DX: Fracture of unspecified part of neck of right femur, subsequent encounter for closed fracture with routine healing: S72.001D

## 2018-09-19 DIAGNOSIS — S42112D Displaced fracture of body of scapula, left shoulder, subsequent encounter for fracture with routine healing: Secondary | ICD-10-CM

## 2018-09-19 HISTORY — DX: Displaced fracture of body of scapula, left shoulder, subsequent encounter for fracture with routine healing: S42.112D

## 2018-09-21 DIAGNOSIS — R410 Disorientation, unspecified: Secondary | ICD-10-CM

## 2018-09-21 HISTORY — DX: Disorientation, unspecified: R41.0

## 2018-10-10 ENCOUNTER — Other Ambulatory Visit (HOSPITAL_COMMUNITY): Payer: Self-pay | Admitting: Family Medicine

## 2018-10-10 ENCOUNTER — Other Ambulatory Visit: Payer: Self-pay | Admitting: Family Medicine

## 2018-10-10 DIAGNOSIS — R531 Weakness: Secondary | ICD-10-CM

## 2018-10-10 DIAGNOSIS — R4189 Other symptoms and signs involving cognitive functions and awareness: Secondary | ICD-10-CM

## 2018-10-11 ENCOUNTER — Emergency Department: Payer: Medicare Other

## 2018-10-11 ENCOUNTER — Encounter: Payer: Self-pay | Admitting: Emergency Medicine

## 2018-10-11 ENCOUNTER — Emergency Department
Admission: EM | Admit: 2018-10-11 | Discharge: 2018-10-11 | Disposition: A | Discharge status: Skilled Nursing Facility | Payer: Medicare Other | Attending: Emergency Medicine | Admitting: Emergency Medicine

## 2018-10-11 DIAGNOSIS — Y999 Unspecified external cause status: Secondary | ICD-10-CM | POA: Diagnosis not present

## 2018-10-11 DIAGNOSIS — Y92122 Bedroom in nursing home as the place of occurrence of the external cause: Secondary | ICD-10-CM | POA: Insufficient documentation

## 2018-10-11 DIAGNOSIS — Z79899 Other long term (current) drug therapy: Secondary | ICD-10-CM | POA: Diagnosis not present

## 2018-10-11 DIAGNOSIS — S0990XA Unspecified injury of head, initial encounter: Secondary | ICD-10-CM | POA: Diagnosis present

## 2018-10-11 DIAGNOSIS — S0181XA Laceration without foreign body of other part of head, initial encounter: Secondary | ICD-10-CM | POA: Insufficient documentation

## 2018-10-11 DIAGNOSIS — E860 Dehydration: Secondary | ICD-10-CM | POA: Insufficient documentation

## 2018-10-11 DIAGNOSIS — Y9389 Activity, other specified: Secondary | ICD-10-CM | POA: Insufficient documentation

## 2018-10-11 DIAGNOSIS — Z8673 Personal history of transient ischemic attack (TIA), and cerebral infarction without residual deficits: Secondary | ICD-10-CM | POA: Diagnosis not present

## 2018-10-11 DIAGNOSIS — W06XXXA Fall from bed, initial encounter: Secondary | ICD-10-CM | POA: Diagnosis not present

## 2018-10-11 LAB — COMPREHENSIVE METABOLIC PANEL
ALT: 18 U/L (ref 0–44)
AST: 29 U/L (ref 15–41)
Albumin: 3.9 g/dL (ref 3.5–5.0)
Alkaline Phosphatase: 47 U/L (ref 38–126)
Anion gap: 9 (ref 5–15)
BUN: 17 mg/dL (ref 8–23)
CO2: 31 mmol/L (ref 22–32)
Calcium: 9.1 mg/dL (ref 8.9–10.3)
Chloride: 101 mmol/L (ref 98–111)
Creatinine, Ser: 0.72 mg/dL (ref 0.61–1.24)
GFR calc Af Amer: >60 mL/min (ref >60)
GFR calc non Af Amer: >60 mL/min (ref >60)
Glucose, Bld: 145 mg/dL — ABNORMAL HIGH (ref 70–99)
Potassium: 3.6 mmol/L (ref 3.5–5.1)
Sodium: 141 mmol/L (ref 135–145)
Total Bilirubin: 1.3 mg/dL — ABNORMAL HIGH (ref 0.3–1.2)
Total Protein: 6.4 g/dL — ABNORMAL LOW (ref 6.5–8.1)

## 2018-10-11 LAB — URINALYSIS, COMPLETE (UACMP) WITH MICROSCOPIC
Bacteria, UA: NONE SEEN
Bilirubin Urine: NEGATIVE
Glucose, UA: NEGATIVE mg/dL
Hgb urine dipstick: NEGATIVE
Ketones, ur: 20 mg/dL — AB
Leukocytes,Ua: NEGATIVE
Nitrite: NEGATIVE
Protein, ur: NEGATIVE mg/dL
Specific Gravity, Urine: 1.029 (ref 1.005–1.030)
Squamous Epithelial / HPF: NONE SEEN (ref 0–5)
pH: 5 (ref 5.0–8.0)

## 2018-10-11 LAB — CBC
HCT: 37.3 % — ABNORMAL LOW (ref 39.0–52.0)
Hemoglobin: 12.3 g/dL — ABNORMAL LOW (ref 13.0–17.0)
MCH: 36.1 pg — ABNORMAL HIGH (ref 26.0–34.0)
MCHC: 33 g/dL (ref 30.0–36.0)
MCV: 109.4 fL — ABNORMAL HIGH (ref 80.0–100.0)
Platelets: 119 10*3/uL — ABNORMAL LOW (ref 150–400)
RBC: 3.41 MIL/uL — ABNORMAL LOW (ref 4.22–5.81)
RDW: 13.5 % (ref 11.5–15.5)
WBC: 5.9 10*3/uL (ref 4.0–10.5)
nRBC: 0 % (ref 0.0–0.2)

## 2018-10-11 MED ORDER — LIDOCAINE-EPINEPHRINE 2 %-1:100000 IJ SOLN
20.0000 mL | Freq: Once | INTRAMUSCULAR | Status: AC
  Administered 2018-10-11: 12:00:00 20 mL via INTRADERMAL
  Filled 2018-10-11: qty 1

## 2018-10-11 NOTE — ED Provider Notes (Addendum)
[facility] Emergency Department Provider Note  ____________________________________________   First MD Initiated Contact with Patient 10/11/18 1031     (approximate)  I have reviewed the triage vital signs and the nursing notes.   HISTORY  Chief Complaint Fall and Head Injury    HPI Alvin Hudson is a 83 y.o. male here with fall.  The patient has an extensive recent history including recent traumatic subdural status post craniotomy, CVA, now in a nursing facility, here with fall.  The patient has a history of trying to stand up very quickly on his own.  He has lost some of his inhibition due to his stroke and craniotomy.  He reportedly tried to get up quickly out of bed today, and fell forward.  He struck his head.  He is no longer on blood thinners.  He is otherwise at his mental baseline.  He has not been sick.  No recent medication changes.  He has had chronically decreased p.o. intake since his hospitalization but this is not acutely changed.        Past Medical History:  Diagnosis Date   Allergy    Seizures [facility](HCC)     Patient Active Problem List   Diagnosis Date Noted   CVA (cerebral vascular accident) (HCC) 04/19/2018   Anemia of chronic disease    Megaloblastic anemia    Hypoalbuminemia due to protein-calorie malnutrition (HCC)    Leukocytosis    New onset atrial fibrillation (HCC)    Seizures (HCC)    Traumatic subdural hematoma (HCC) 03/20/2018   History of subdural hematoma    Dysphagia    Atrial fibrillation (HCC) 03/17/2018   Aphasia 03/12/2018   Seizure (HCC) 03/12/2018   Subdural hematoma (HCC) 03/10/2018    Past Surgical History:  Procedure Laterality Date   CRANIOTOMY Left 03/10/2018   Procedure: CRANIOTOMY HEMATOMA EVACUATION SUBDURAL;  Surgeon: Tressie StalkerJenkins, Jeffrey, MD;  [facility];  Service: Neurosurgery;  Laterality: Left;   INGUINAL HERNIA REPAIR Bilateral     Prior to Admission medications     Medication Sig Start Date End Date Taking? Authorizing Provider  acetaminophen (TYLENOL) 325 MG tablet Take 2 tablets (650 mg total) by mouth every 4 (four) hours as needed for mild pain (temp > 100.5). 04/11/18  Yes Angiulli, Mcarthur Rossettianiel J, PA-C  atorvastatin (LIPITOR) 40 MG tablet Take 1 tablet (40 mg total) by mouth daily. Patient taking differently: Take 40 mg by mouth at bedtime.  04/22/18  Yes Mody, Patricia PesaSital, MD  divalproex (DEPAKOTE SPRINKLE) 125 MG capsule Take 500 mg by mouth 3 (three) times daily.   Yes [provider]  ipratropium (ATROVENT) 0.03 % nasal spray Place 2 sprays into both nostrils 2 (two) times daily as needed for rhinitis.   Yes [provider]  lacosamide (VIMPAT) 200 MG TABS tablet Take 1 tablet (200 mg total) by mouth 2 (two) times daily. 04/11/18  Yes Angiulli, Mcarthur Rossettianiel J, PA-C  levETIRAcetam (KEPPRA) 750 MG tablet Take 2 tablets (1,500 mg total) by mouth 2 (two) times daily. 04/22/18  Yes Mody, Patricia PesaSital, MD  Melatonin 3 MG TABS Take 3 mg by mouth at bedtime.   Yes [provider]  midodrine (PROAMATINE) 5 MG tablet Take 5 mg by mouth 3 (three) times daily. (holf if SBP >120)   Yes [provider]  polyethylene glycol (MIRALAX / GLYCOLAX) packet Take 17 g by mouth daily. Patient taking differently: Take 17 g by mouth at bedtime.  04/11/18  Yes Angiulli, Mcarthur Rossettianiel J,  PA-C  senna-docusate (SENOKOT-S) 8.6-50 MG tablet Take 2 tablets by mouth at bedtime. Patient taking differently: Take 2 tablets by mouth at bedtime as needed for mild constipation or moderate constipation.  04/11/18  Yes Angiulli, Mcarthur Rossetti, PA-C  sertraline (ZOLOFT) 25 MG tablet Take 25 mg by mouth at bedtime. (take with 50mg  dose to equal 75mg )   Yes [provider]  sertraline (ZOLOFT) 50 MG tablet Take 50 mg by mouth at bedtime. (take with 25mg  dose to equal 75mg )   Yes [provider]  zonisamide (ZONEGRAN) 100 MG capsule Take 1 capsule (100 mg total) by mouth daily.  04/22/18  Yes Mody, Patricia Pesa, MD  apixaban (ELIQUIS) 5 MG TABS tablet Take 1 tablet (5 mg total) by mouth 2 (two) times daily. Patient not taking: Reported on 10/11/2018 04/22/18   Adrian Saran, MD  diltiazem (CARDIZEM CD) 240 MG 24 hr capsule Take 1 capsule (240 mg total) by mouth daily. Patient not taking: Reported on 10/11/2018 04/11/18   Angiulli, Mcarthur Rossetti, PA-C  divalproex (DEPAKOTE) 500 MG DR tablet Take 1 tablet (500 mg total) by mouth every 8 (eight) hours. Patient not taking: Reported on 10/11/2018 04/11/18   Angiulli, Mcarthur Rossetti, PA-C  docusate sodium (COLACE) 100 MG capsule Take 1 capsule (100 mg total) by mouth 2 (two) times daily. Patient not taking: Reported on 10/11/2018 03/12/18   Tressie Stalker, MD  loratadine (CLARITIN) 10 MG tablet Take 1 tablet (10 mg total) by mouth daily. Patient not taking: Reported on 10/11/2018 04/11/18   Angiulli, Mcarthur Rossetti, PA-C  sertraline (ZOLOFT) 100 MG tablet Take 1 tablet (100 mg total) by mouth daily. Patient not taking: Reported on 10/11/2018 04/11/18   Angiulli, Mcarthur Rossetti, PA-C    Allergies Aspirin and Naproxen  No family history on file.  Social History Social History   Tobacco Use   Smoking status: Never Smoker   Smokeless tobacco: Never Used  Substance Use Topics   Alcohol use: Yes    Alcohol/week: 14.0 standard drinks    Types: 14 Standard drinks or equivalent per week   Drug use: No    Review of Systems  Review of Systems  Unable to perform ROS: Dementia  Skin: Positive for wound.     ____________________________________________  PHYSICAL EXAM:      VITAL SIGNS: ED Triage Vitals  Enc Vitals Group     BP 10/11/18 1032 (!) 137/93     Pulse Rate 10/11/18 1115 (!) 53     Resp 10/11/18 1032 19     Temp 10/11/18 1032 (!) 97.5 F (36.4 C)     Temp Source 10/11/18 1032 Oral     SpO2 10/11/18 1032 93 %     Weight 10/11/18 1033 134 lb 7.7 oz (61 kg)     Height 10/11/18 1033 5\' 10"  (1.778 m)     Head Circumference --      Peak  Flow --      Pain Score 10/11/18 1033 0     Pain Loc --      Pain Edu? --      Excl. in GC? --      Physical Exam Vitals signs and nursing note reviewed.  Constitutional:      General: He is not in acute distress.    Appearance: He is well-developed.  HENT:     Head: Normocephalic and atraumatic.     Comments: Status post craniotomy which appears well-healed.  Approximately 2 cm superficial linear laceration along the central  forehead, with mild surrounding ecchymosis.  No deep laceration.  No exposed calvarium. Eyes:     Conjunctiva/sclera: Conjunctivae normal.  Neck:     Musculoskeletal: Neck supple.     Comments: No apparent neck pain or tenderness Cardiovascular:     Rate and Rhythm: Normal rate and regular rhythm.     Heart sounds: Normal heart sounds.  Pulmonary:     Effort: Pulmonary effort is normal. No respiratory distress.     Breath sounds: No wheezing.  Abdominal:     General: There is no distension.  Skin:    General: Skin is warm.     Capillary Refill: Capillary refill takes less than 2 seconds.     Findings: No rash.  Neurological:     Mental Status: He is alert and oriented to person, place, and time.     Motor: No abnormal muscle tone.       ____________________________________________   LABS (all labs ordered are listed, but only abnormal results are displayed)  Labs Reviewed  COMPREHENSIVE METABOLIC PANEL - Abnormal; Notable for the following components:      Result Value   Glucose, Bld 145 (*)    Total Protein 6.4 (*)    Total Bilirubin 1.3 (*)    All other components within normal limits  CBC - Abnormal; Notable for the following components:   RBC 3.41 (*)    Hemoglobin 12.3 (*)    HCT 37.3 (*)    MCV 109.4 (*)    MCH 36.1 (*)    Platelets 119 (*)    All other components within normal limits  URINALYSIS, COMPLETE (UACMP) WITH MICROSCOPIC - Abnormal; Notable for the following components:   Color, Urine AMBER (*)    APPearance CLEAR (*)     Ketones, ur 20 (*)    All other components within normal limits  CBG MONITORING, ED    ____________________________________________  EKG: Normal sinus rhythm, left bundle branch block with repull abnormality, no significant change from prior.  Ventricular rate 70, QRS 173, QTc 521. ________________________________________  RADIOLOGY All imaging, including plain films, CT scans, and ultrasounds, independently reviewed by me, and interpretations confirmed via formal radiology reads.  ED MD interpretation:   CT head: No definite acute intracranial findings, likely resolving subdural hematoma CT C-spine: Negative Chest x-ray: Negative  Official radiology report(s): Dg Chest 2 View  Result Date: 10/11/2018 CLINICAL DATA:  Weakness EXAM: CHEST - 2 VIEW COMPARISON:  04/19/2018 FINDINGS: The heart size and mediastinal contours are stable. Calcific aortic knob. Both lungs are clear. No pleural effusion or pneumothorax. Multiple skin folds overlie the lateral aspect of the right hemithorax. IMPRESSION: No active cardiopulmonary disease. Electronically Signed   By: Duanne Guess M.D.   On: 10/11/2018 11:47   Ct Head Wo Contrast  Result Date: 10/11/2018 CLINICAL DATA:  Subacute head trauma with neuro/cognitive defects. Unwitnessed fall today. History of subdural hematoma. EXAM: CT HEAD WITHOUT CONTRAST CT CERVICAL SPINE WITHOUT CONTRAST TECHNIQUE: Multidetector CT imaging of the head and cervical spine was performed following the standard protocol without intravenous contrast. Multiplanar CT image reconstructions of the cervical spine were also generated. COMPARISON:  MRI head 04/20/2018.  CT of 04/19/2018. FINDINGS: CT HEAD FINDINGS Brain: Residual extra-axial fluid collection overlying the left frontotemporal convexity has decreased in thickness compared with the prior studies, now measuring 5 mm in thickness. There is associated dural thickening in this area, but no definite recurrent acute  hemorrhage. There is no midline shift, hydrocephalus, brain edema or  cortical infarct. There is generalized atrophy with prominence of the ventricles and subarachnoid spaces. Chronic small vessel ischemic changes are present in the periventricular white matter. Vascular: Intracranial vascular calcifications. No evidence of hyperdense vessel. Skull: Stable left frontotemporal craniotomy. No evidence of acute calvarial fracture. Sinuses/Orbits: The visualized paranasal sinuses, mastoid air cells and middle ears are clear. No orbital abnormalities. Other: Mild soft tissue swelling in the frontal scalp. CT CERVICAL SPINE FINDINGS Alignment: Straightening with a mild degenerative anterolisthesis at C2-3 and C7-T1. Skull base and vertebrae: No evidence of acute cervical spine fracture or traumatic subluxation. There is multilevel spondylosis with advanced disc space narrowing and uncinate spurring from C3-4 through C6-7. Asymmetric left-sided facet arthropathy at C2-3. Soft tissues and spinal canal: No prevertebral fluid or swelling. No visible canal hematoma. Disc levels: Multilevel cervical spondylosis contributes to mild spinal and mild osseous foraminal narrowing bilaterally from C3-4 through C6-7. No large disc herniation identified. Upper chest: Unremarkable. Other: Bilateral carotid atherosclerosis. IMPRESSION: 1. No definite acute intracranial findings. There is a residual small extra-axial fluid collection adjacent to the left frontotemporal craniotomy which has decreased in thickness compared with studies done 6 months ago. Adjacent dural thickening without definite acute hemorrhage. 2. Stable atrophy and chronic small vessel ischemic changes. No residual midline shift. 3. No evidence of acute cervical spine fracture, traumatic subluxation or static signs of instability. Advanced multilevel cervical spondylosis. Electronically Signed   By: Carey BullocksWilliam  Veazey M.D.   On: 10/11/2018 12:10   Ct Cervical Spine Wo  Contrast  Result Date: 10/11/2018 CLINICAL DATA:  Subacute head trauma with neuro/cognitive defects. Unwitnessed fall today. History of subdural hematoma. EXAM: CT HEAD WITHOUT CONTRAST CT CERVICAL SPINE WITHOUT CONTRAST TECHNIQUE: Multidetector CT imaging of the head and cervical spine was performed following the standard protocol without intravenous contrast. Multiplanar CT image reconstructions of the cervical spine were also generated. COMPARISON:  MRI head 04/20/2018.  CT of 04/19/2018. FINDINGS: CT HEAD FINDINGS Brain: Residual extra-axial fluid collection overlying the left frontotemporal convexity has decreased in thickness compared with the prior studies, now measuring 5 mm in thickness. There is associated dural thickening in this area, but no definite recurrent acute hemorrhage. There is no midline shift, hydrocephalus, brain edema or cortical infarct. There is generalized atrophy with prominence of the ventricles and subarachnoid spaces. Chronic small vessel ischemic changes are present in the periventricular white matter. Vascular: Intracranial vascular calcifications. No evidence of hyperdense vessel. Skull: Stable left frontotemporal craniotomy. No evidence of acute calvarial fracture. Sinuses/Orbits: The visualized paranasal sinuses, mastoid air cells and middle ears are clear. No orbital abnormalities. Other: Mild soft tissue swelling in the frontal scalp. CT CERVICAL SPINE FINDINGS Alignment: Straightening with a mild degenerative anterolisthesis at C2-3 and C7-T1. Skull base and vertebrae: No evidence of acute cervical spine fracture or traumatic subluxation. There is multilevel spondylosis with advanced disc space narrowing and uncinate spurring from C3-4 through C6-7. Asymmetric left-sided facet arthropathy at C2-3. Soft tissues and spinal canal: No prevertebral fluid or swelling. No visible canal hematoma. Disc levels: Multilevel cervical spondylosis contributes to mild spinal and mild  osseous foraminal narrowing bilaterally from C3-4 through C6-7. No large disc herniation identified. Upper chest: Unremarkable. Other: Bilateral carotid atherosclerosis. IMPRESSION: 1. No definite acute intracranial findings. There is a residual small extra-axial fluid collection adjacent to the left frontotemporal craniotomy which has decreased in thickness compared with studies done 6 months ago. Adjacent dural thickening without definite acute hemorrhage. 2. Stable atrophy and chronic small vessel ischemic changes. No  residual midline shift. 3. No evidence of acute cervical spine fracture, traumatic subluxation or static signs of instability. Advanced multilevel cervical spondylosis. Electronically Signed   By: Richardean Sale M.D.   On: 10/11/2018 12:10    ____________________________________________  PROCEDURES   Procedure(s) performed (including Critical Care):  Marland KitchenMarland KitchenLaceration Repair  Date/Time: 10/12/2018 12:03 PM Performed by: Duffy Bruce, MD Authorized by: Duffy Bruce, MD   Consent:    Consent obtained:  Verbal   Consent given by:  Patient   Risks discussed:  Infection, need for additional repair, pain, tendon damage, retained foreign body, vascular damage, poor cosmetic result, poor wound healing and nerve damage   Alternatives discussed:  Referral and delayed treatment Anesthesia (see MAR for exact dosages):    Anesthesia method:  Local infiltration   Local anesthetic:  Lidocaine 1% WITH epi Laceration details:    Location:  Scalp   Scalp location:  Frontal   Length (cm):  2 Pre-procedure details:    Preparation:  Patient was prepped and draped in usual sterile fashion and imaging obtained to evaluate for foreign bodies Exploration:    Hemostasis achieved with:  Direct pressure   Wound exploration: wound explored through full range of motion and entire depth of wound probed and visualized   Treatment:    Area cleansed with:  Betadine   Amount of cleaning:   Extensive   Irrigation solution:  Sterile water   Irrigation method:  Pressure wash Skin repair:    Repair method:  Sutures   Suture size:  5-0   Suture material:  Fast-absorbing gut   Suture technique:  Simple interrupted   Number of sutures:  4 Approximation:    Approximation:  Close Post-procedure details:    Dressing:  Antibiotic ointment, non-adherent dressing and sterile dressing   Patient tolerance of procedure:  Tolerated well, no immediate complications    ____________________________________________  INITIAL IMPRESSION / MDM / ASSESSMENT AND PLAN / ED COURSE  As part of my medical decision making, I reviewed the following data within the [place] was evaluated in Emergency Department on 10/11/2018 for the symptoms described in the history of present illness. He was evaluated in the context of the global COVID-19 pandemic, which necessitated consideration that the patient might be at risk for infection with the SARS-CoV-2 virus that causes COVID-19. Institutional protocols and algorithms that pertain to the evaluation of patients at risk for COVID-19 are in a state of rapid change based on information released by regulatory bodies including the CDC and federal and state organizations. These policies and algorithms were followed during the patient's care in the ED.  Some ED evaluations and interventions may be delayed as a result of limited staffing during the pandemic.*      Medical Decision Making: 83 year old male here with fall.  I suspect this was non-syncopal and mechanical in nature given his history.  Superficial laceration repaired by myself.  Imaging shows no new intracranial abnormalities and his subdural hematoma appears to be resolving.  Is at his mental baseline.  Lab work is overall reassuring with exception of very minimal dehydration and he is encouraged to drink fluids.  No signs of UTI or pneumonia.  EKG is nonischemic,  with LVH which appears to be unchanged.  Patient is otherwise stable, will discharge back to facility. Tetanus is UTD.  ____________________________________________  FINAL CLINICAL IMPRESSION(S) / ED DIAGNOSES  Final diagnoses:  Laceration of forehead, initial encounter  Mild  dehydration     MEDICATIONS GIVEN DURING THIS VISIT:  Medications  lidocaine-EPINEPHrine (XYLOCAINE W/EPI) 2 %-1:100000 (with pres) injection 20 mL (20 mLs Intradermal Given by Other 10/11/18 1227)     ED Discharge Orders    None       Note:  This document was prepared using Dragon voice recognition software and may include unintentional dictation errors.   Shaune Pollack, MD 10/11/18 1336    Shaune Pollack, MD 10/12/18 1204

## 2018-10-11 NOTE — ED Notes (Signed)
Patient transported to CT 

## 2018-10-11 NOTE — ED Notes (Signed)
Patient provided warm blanket upon request.

## 2018-10-11 NOTE — Discharge Instructions (Addendum)
These sutures and glue should dissolve on their own.  Follow-up with a primary or regular doctor in 1 week for wound check.  Try to drink as much fluid as possible.  Your labs did show very mild dehydration.

## 2018-10-11 NOTE — ED Notes (Signed)
[facility] contacted to inform pt is being discharged back to their facility. Facility also made aware that wife will be bringing pt back.

## 2018-10-11 NOTE — ED Triage Notes (Signed)
Pt in via [facility] from [facility].  Pt with unwittnesed fall out of wheel chair today, landing on head, small hematoma and laceration noted to mid forehead.  Pt with one episode vomiting with EMS.  Pt alert to self only.  Per facility, patient with declining cognitive function over the last few weeks.

## 2018-10-16 DIAGNOSIS — R1312 Dysphagia, oropharyngeal phase: Secondary | ICD-10-CM

## 2018-10-16 HISTORY — DX: Dysphagia, oropharyngeal phase: R13.12

## 2018-10-20 ENCOUNTER — Ambulatory Visit: Payer: Medicare Other

## 2018-10-27 ENCOUNTER — Encounter: Payer: Self-pay | Admitting: *Deleted

## 2018-10-27 ENCOUNTER — Emergency Department
Admission: EM | Admit: 2018-10-27 | Discharge: 2018-10-27 | Disposition: A | Discharge status: Home / Self Care | Payer: Medicare Other | Attending: Emergency Medicine | Admitting: Emergency Medicine

## 2018-10-27 ENCOUNTER — Emergency Department: Payer: Medicare Other

## 2018-10-27 ENCOUNTER — Other Ambulatory Visit: Payer: Self-pay

## 2018-10-27 DIAGNOSIS — Z8673 Personal history of transient ischemic attack (TIA), and cerebral infarction without residual deficits: Secondary | ICD-10-CM | POA: Insufficient documentation

## 2018-10-27 DIAGNOSIS — R55 Syncope and collapse: Secondary | ICD-10-CM | POA: Insufficient documentation

## 2018-10-27 DIAGNOSIS — R4182 Altered mental status, unspecified: Secondary | ICD-10-CM | POA: Diagnosis present

## 2018-10-27 DIAGNOSIS — Z79899 Other long term (current) drug therapy: Secondary | ICD-10-CM | POA: Insufficient documentation

## 2018-10-27 HISTORY — DX: Thrombocytopenia, unspecified: D69.6

## 2018-10-27 LAB — URINALYSIS, ROUTINE W REFLEX MICROSCOPIC
Bacteria, UA: NONE SEEN
Glucose, UA: NEGATIVE mg/dL
Hgb urine dipstick: NEGATIVE
Ketones, ur: 5 mg/dL — AB
Leukocytes,Ua: NEGATIVE
Nitrite: NEGATIVE
Protein, ur: 30 mg/dL — AB
Specific Gravity, Urine: 1.026 (ref 1.005–1.030)
Squamous Epithelial / HPF: NONE SEEN (ref 0–5)
pH: 6 (ref 5.0–8.0)

## 2018-10-27 LAB — COMPREHENSIVE METABOLIC PANEL
ALT: 18 U/L (ref 0–44)
AST: 30 U/L (ref 15–41)
Albumin: 3.8 g/dL (ref 3.5–5.0)
Alkaline Phosphatase: 58 U/L (ref 38–126)
Anion gap: 10 (ref 5–15)
BUN: 18 mg/dL (ref 8–23)
CO2: 29 mmol/L (ref 22–32)
Calcium: 9.2 mg/dL (ref 8.9–10.3)
Chloride: 103 mmol/L (ref 98–111)
Creatinine, Ser: 0.73 mg/dL (ref 0.61–1.24)
GFR calc Af Amer: >60 mL/min (ref >60)
GFR calc non Af Amer: >60 mL/min (ref >60)
Glucose, Bld: 100 mg/dL — ABNORMAL HIGH (ref 70–99)
Potassium: 4.2 mmol/L (ref 3.5–5.1)
Sodium: 142 mmol/L (ref 135–145)
Total Bilirubin: 1.1 mg/dL (ref 0.3–1.2)
Total Protein: 6.3 g/dL — ABNORMAL LOW (ref 6.5–8.1)

## 2018-10-27 LAB — DIFFERENTIAL
Abs Immature Granulocytes: 0.02 10*3/uL (ref 0.00–0.07)
Basophils Absolute: 0 10*3/uL (ref 0.0–0.1)
Basophils Relative: 0 %
Eosinophils Absolute: 0 10*3/uL (ref 0.0–0.5)
Eosinophils Relative: 1 %
Immature Granulocytes: 0 %
Lymphocytes Relative: 35 %
Lymphs Abs: 1.8 10*3/uL (ref 0.7–4.0)
Monocytes Absolute: 0.3 10*3/uL (ref 0.1–1.0)
Monocytes Relative: 5 %
Neutro Abs: 3 10*3/uL (ref 1.7–7.7)
Neutrophils Relative %: 59 %
Smear Review: NORMAL
WBC Morphology: REACTIVE

## 2018-10-27 LAB — CBC
HCT: 40.2 % (ref 39.0–52.0)
Hemoglobin: 12.8 g/dL — ABNORMAL LOW (ref 13.0–17.0)
MCH: 35.5 pg — ABNORMAL HIGH (ref 26.0–34.0)
MCHC: 31.8 g/dL (ref 30.0–36.0)
MCV: 111.4 fL — ABNORMAL HIGH (ref 80.0–100.0)
Platelets: 124 10*3/uL — ABNORMAL LOW (ref 150–400)
RBC: 3.61 MIL/uL — ABNORMAL LOW (ref 4.22–5.81)
RDW: 14 % (ref 11.5–15.5)
WBC: 5.1 10*3/uL (ref 4.0–10.5)
nRBC: 0 % (ref 0.0–0.2)

## 2018-10-27 LAB — APTT: aPTT: 30 seconds (ref 24–36)

## 2018-10-27 LAB — PROTIME-INR
INR: 1.1 (ref 0.8–1.2)
Prothrombin Time: 13.9 seconds (ref 11.4–15.2)

## 2018-10-27 LAB — TROPONIN I (HIGH SENSITIVITY)
Troponin I (High Sensitivity): 18 ng/L — ABNORMAL HIGH (ref <18)
Troponin I (High Sensitivity): 20 ng/L — ABNORMAL HIGH (ref <18)

## 2018-10-27 LAB — VALPROIC ACID LEVEL: Valproic Acid Lvl: 82 ug/mL (ref 50.0–100.0)

## 2018-10-27 MED ORDER — SODIUM CHLORIDE 0.9 % IV BOLUS
1000.0000 mL | Freq: Once | INTRAVENOUS | Status: AC
  Administered 2018-10-27: 14:00:00 1000 mL via INTRAVENOUS

## 2018-10-27 NOTE — ED Notes (Signed)
Per conversation with Stanton Kidney, caregiver at [facility]: transport patient home via EMS because "Our driver went at five o'clock, and our [place] doesn't allow for family members to transport patient's back."

## 2018-10-27 NOTE — ED Notes (Signed)
Called [facility] for transport back to [facility]

## 2018-10-27 NOTE — ED Provider Notes (Addendum)
[facility] Emergency Department Provider Note  ____________________________________________   First MD Initiated Contact with Patient 10/27/18 1214     (approximate)  I have reviewed the triage vital signs and the nursing notes.   HISTORY  Chief Complaint Altered Mental Status    HPI Alvin Hudson is a 83 y.o. male with prior history of seizures who presents after being seen by physical therapy.  Patient went from standing to sitting and had a blank look on his face and became unresponsive.  This lasted for a few minutes and then came back to baseline.  Was reported that his eyes rolled up into his head.  Patient is altered at baseline and is a resident of [facility].  Patient does have a history of prior stroke with right-sided deficits.  Patient had a history of traumatic subdural status post craniotomy.  Daughter is at bedside and says that he is now acting his baseline self.  Unable to get full HPI due to patient's baseline altered mental status.      Past Medical History:  Diagnosis Date  . Allergy   . Seizures [facility])     Patient Active Problem List   Diagnosis Date Noted  . CVA (cerebral vascular accident) (HCC) 04/19/2018  . Anemia of chronic disease   . Megaloblastic anemia   . Hypoalbuminemia due to protein-calorie malnutrition (HCC)   . Leukocytosis   . New onset atrial fibrillation (HCC)   . Seizures (HCC)   . 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 History of subdural hematoma   . Dysphagia   . Atrial fibrillation (HCC) 03/17/2018  . Aphasia 03/12/2018  . Seizure (HCC) 03/12/2018  . Subdural hematoma (HCC) 03/10/2018    Past Surgical History:  Procedure Laterality Date  . CRANIOTOMY Left 03/10/2018   Procedure: CRANIOTOMY HEMATOMA EVACUATION SUBDURAL;  Surgeon: Tressie Stalker, MD;  Location: [facility] OR;  Service: Neurosurgery;  Laterality: Left;  . INGUINAL HERNIA REPAIR Bilateral     Prior to Admission  medications   Medication Sig Start Date End Date Taking? Authorizing Provider  acetaminophen (TYLENOL) 325 MG tablet Take 2 tablets (650 mg total) by mouth every 4 (four) hours as needed for mild pain (temp > 100.5). 04/11/18   Angiulli, Mcarthur Rossetti, PA-C  apixaban (ELIQUIS) 5 MG TABS tablet Take 1 tablet (5 mg total) by mouth 2 (two) times daily. Patient not taking: Reported on 10/11/2018 04/22/18   Adrian Saran, MD  atorvastatin (LIPITOR) 40 MG tablet Take 1 tablet (40 mg total) by mouth daily. Patient taking differently: Take 40 mg by mouth at bedtime.  04/22/18   Adrian Saran, MD  diltiazem (CARDIZEM CD) 240 MG 24 hr capsule Take 1 capsule (240 mg total) by mouth daily. Patient not taking: Reported on 10/11/2018 04/11/18   Angiulli, Mcarthur Rossetti, PA-C  divalproex (DEPAKOTE SPRINKLE) 125 MG capsule Take 500 mg by mouth 3 (three) times daily.    [provider]  divalproex (DEPAKOTE) 500 MG DR tablet Take 1 tablet (500 mg total) by mouth every 8 (eight) hours. Patient not taking: Reported on 10/11/2018 04/11/18   Angiulli, Mcarthur Rossetti, PA-C  docusate sodium (COLACE) 100 MG capsule Take 1 capsule (100 mg total) by mouth 2 (two) times daily. Patient not taking: Reported on 10/11/2018 03/12/18   Tressie Stalker, MD  ipratropium (ATROVENT) 0.03 % nasal spray Place 2 sprays into both nostrils 2 (two) times daily as needed for rhinitis.    [provider]  lacosamide (VIMPAT)  200 MG TABS tablet Take 1 tablet (200 mg total) by mouth 2 (two) times daily. 04/11/18   Angiulli, Mcarthur Rossetti, PA-C  levETIRAcetam (KEPPRA) 750 MG tablet Take 2 tablets (1,500 mg total) by mouth 2 (two) times daily. 04/22/18   Adrian Saran, MD  loratadine (CLARITIN) 10 MG tablet Take 1 tablet (10 mg total) by mouth daily. Patient not taking: Reported on 10/11/2018 04/11/18   Angiulli, Mcarthur Rossetti, PA-C  Melatonin 3 MG TABS Take 3 mg by mouth at bedtime.    [provider]  midodrine (PROAMATINE) 5 MG tablet Take 5 mg by mouth 3  (three) times daily. (holf if SBP >120)    [provider]  polyethylene glycol (MIRALAX / GLYCOLAX) packet Take 17 g by mouth daily. Patient taking differently: Take 17 g by mouth at bedtime.  04/11/18   Angiulli, Mcarthur Rossetti, PA-C  senna-docusate (SENOKOT-S) 8.6-50 MG tablet Take 2 tablets by mouth at bedtime. Patient taking differently: Take 2 tablets by mouth at bedtime as needed for mild constipation or moderate constipation.  04/11/18   Angiulli, Mcarthur Rossetti, PA-C  sertraline (ZOLOFT) 100 MG tablet Take 1 tablet (100 mg total) by mouth daily. Patient not taking: Reported on 10/11/2018 04/11/18   Angiulli, Mcarthur Rossetti, PA-C  sertraline (ZOLOFT) 25 MG tablet Take 25 mg by mouth at bedtime. (take with  dose to equal )    [provider]  sertraline (ZOLOFT) 50 MG tablet Take 50 mg by mouth at bedtime. (take with  dose to equal )    [provider]  zonisamide (ZONEGRAN) 100 MG capsule Take 1 capsule (100 mg total) by mouth daily. 04/22/18   Adrian Saran, MD    Allergies Aspirin and Naproxen  No family history on file.  Social History Social History   Tobacco Use  . Smoking status: Never Smoker  . Smokeless tobacco: Never Used  Substance Use Topics  . Alcohol use: Yes    Alcohol/week: 14.0 standard drinks    Types: 14 Standard drinks or equivalent per week  . Drug use: No      Review of Systems Unable to get full review of system due to patient's baseline altered mental status ____________________________________________   PHYSICAL EXAM:  VITAL SIGNS: Vitals:   10/27/18 1430 10/27/18 1500  BP: 104/75 103/73  Pulse: (!) 59 (!) 59  Resp: 11 12  SpO2: 100% 100%     Constitutional: Alert and oriented x1 Eyes: Conjunctivae are normal. EOMI. Head: Atraumatic. Nose: No congestion/rhinnorhea. Mouth/Throat: Mucous membranes are moist.   Neck: No stridor. Trachea Midline. FROM Cardiovascular: Bradycardic, regular rhythm. Grossly normal heart  sounds.  Good peripheral circulation. Respiratory: Normal respiratory effort.  No retractions. Lungs CTAB. Gastrointestinal: Soft and nontender. No distention. No abdominal bruits.  Musculoskeletal: No lower extremity tenderness nor edema.  No joint effusions. Neurologic: Alert and oriented x1.  Speaking and at baseline per family member.  Slightly decreased strength in the right arm.  Unable to lift either leg off the bed. Skin:  Skin is warm, dry and intact. No rash noted. Psychiatric: Mood and affect are normal. Speech and behavior are normal. GU: Deferred   ____________________________________________   LABS (all labs ordered are listed, but only abnormal results are displayed)  Labs Reviewed  CBC - Abnormal; Notable for the following components:      Result Value   RBC 3.61 (*)    Hemoglobin 12.8 (*)    MCV 111.4 (*)    MCH 35.5 (*)  Platelets 124 (*)    All other components within normal limits  COMPREHENSIVE METABOLIC PANEL - Abnormal; Notable for the following components:   Glucose, Bld 100 (*)    Total Protein 6.3 (*)    All other components within normal limits  URINALYSIS, ROUTINE W REFLEX MICROSCOPIC - Abnormal; Notable for the following components:   Color, Urine AMBER (*)    APPearance HAZY (*)    Bilirubin Urine SMALL (*)    Ketones, ur 5 (*)    Protein, ur 30 (*)    All other components within normal limits  TROPONIN I (HIGH SENSITIVITY) - Abnormal; Notable for the following components:   Troponin I (High Sensitivity) 18 (*)    All other components within normal limits  PROTIME-INR  APTT  DIFFERENTIAL  VALPROIC ACID LEVEL  LEVETIRACETAM LEVEL  TROPONIN I (HIGH SENSITIVITY)   ____________________________________________   ED ECG REPORT I, Vanessa Northport, the attending physician, personally viewed and interpreted this ECG.  EKG is a left bundle branch block scar Bosa negative, mild ST elevation in aVR, V1, V2 that is similar to prior EKGs.  ST  depression in V5, V6 again similar to prior.  Intervals are otherwise normal.  EKG looks similar to prior on 9/16 with the ST depressions in aVR elevation. ____________________________________________  RADIOLOGY   Official radiology report(s): Ct Head Wo Contrast  Result Date: 10/27/2018 CLINICAL DATA:  Altered mental status EXAM: CT HEAD WITHOUT CONTRAST TECHNIQUE: Contiguous axial images were obtained from the base of the skull through the vertex without intravenous contrast. COMPARISON:  10/11/2018 FINDINGS: Brain: Residual extra-axial fluid collection again identified overlying the left frontotemporal convexity measuring up to 5 mm in thickness (series 3, image 15) unchanged from prior CT. No definite recurrent acute hemorrhage. No midline shift. Ventricular size and configuration is stable without evidence of hydrocephalus. No evidence of acute infarct or edema. Moderate diffuse cerebral volume loss. Low-density changes in the periventricular and subcortical white matter compatible with chronic microvascular ischemic changes. Vascular: Mild atherosclerotic calcifications involving the large vessels of the skull base. No unexpected hyperdense vessel. Skull: Unchanged left frontotemporal craniotomy. No calvarial fracture. Sinuses/Orbits: No acute finding. Other: None. IMPRESSION: 1. Stable size and appearance of small residual extra-axial fluid collection adjacent to left frontotemporal craniotomy. No evidence of recurrent acute hemorrhage. 2. Chronic microvascular ischemic changes and cerebral volume loss without evidence of acute infarct. Electronically Signed   By: Davina Poke M.D.   On: 10/27/2018 12:48    ____________________________________________   PROCEDURES  Procedure(s) performed (including Critical Care):  Procedures   ____________________________________________   INITIAL IMPRESSION / ASSESSMENT AND PLAN / ED COURSE  Alvin Hudson was evaluated in Emergency Department  on 10/27/2018 for the symptoms described in the history of present illness. He was evaluated in the context of the global COVID-19 pandemic, which necessitated consideration that the patient might be at risk for infection with the SARS-CoV-2 virus that causes COVID-19. Institutional protocols and algorithms that pertain to the evaluation of patients at risk for COVID-19 are in a state of rapid change based on information released by regulatory bodies including the CDC and federal and state organizations. These policies and algorithms were followed during the patient's care in the ED.     Patient presents with syncopal or seizure episode that occurred today.  We will give 1 L of fluids given it did happen with position changes so potentially secondary to some dehydration.  Will get labs to evaluate for electrolyte abnormalities, AKI,  UTI.  Will get CT head to evaluate for bleed.  Will get cardiac markers to evaluate for ACS.  Patient EKG does not appear normal but is unchanged from prior EKGs.  Given patient does have a history of seizures will get seizure medication levels.  According to daughter they have recently been adjusting his Keppra.  This is solid patient was postictal or urinated on himself therefore lower suspicion for seizure.   CT head at baseline.  UA no evidence of UTI.  Troponin slightly elevated 18.  I had lengthy discussion with patient's daughter at bedside who is the POA.  Offered patient admission for syncope work-up.  Given patient is DNR and quality of life they would prefer to go home if possible.  They will have a neurology appointment on Monday or Tuesday of next week and can follow-up for discussion of his medications.  Given he is at baseline at this time we agreed on getting repeat troponin and if stable will discharge patient home.  Patient handed off pending repeat troponin.  If patient remains at baseline and cardiac markers are negative and family still wants to go home  would discharge patient.  I had a lengthy discussion with patient not being able to really pick up either leg.  Sounds of patient's been progressively declining over the past few months.  He originally had been able to ambulate a little bit at the facility but now they have needed to intermittently use a lift to move him.  Given the weakness in both legs we discussed MRI of the head or spine.  Given they would not want any surgical interventions and this is been more of a progressive decline over weeks to months we have opted to hold off on further imaging.       ____________________________________________   FINAL CLINICAL IMPRESSION(S) / ED DIAGNOSES   Final diagnoses:  Syncope and collapse      MEDICATIONS GIVEN DURING THIS VISIT:  Medications  sodium chloride 0.9 % bolus 1,000 mL (1,000 mLs Intravenous New Bag/Given 10/27/18 1421)     ED Discharge Orders    None       Note:  This document was prepared using Dragon voice recognition software and may include unintentional dictation errors.   Concha SeFunke, May Manrique E, MD 10/27/18 1616    Concha SeFunke, Kendal Raffo E, MD 10/27/18 [phone]

## 2018-10-27 NOTE — ED Notes (Signed)
Spoke staff at [facility] [employer] [employer] RN states at baseline pt alert, with some confusion. Facility reported when in therapy this morning pt was standing, when attempting to sit,  pt head went back, eyes rolled completely back then blank stare.  Pale, white around mouth. Not talking initially, but started speaking again after around 5 mins. normally able to speak and find word okay. Normally able remember staff names unable to do so after episode this morning. RN reports Puree diet and nectar thick liquids. meds crushed in apple sauce. Unable to ambulate but normally able to stand and pivot. Pt unable to do so this morning, staff had to use lift.

## 2018-10-27 NOTE — ED Notes (Signed)
Daughter at bedside, updated pt and fam on POC, awaiting EMS transport

## 2018-10-27 NOTE — ED Notes (Signed)
Dr. Jari Pigg states to this RN that a NIH stroke scale is not needed at this time.

## 2018-10-27 NOTE — Discharge Instructions (Addendum)
Patient was seen for episode of loss of consciousness.  Discussed admission for further work-up versus going home.  Given his work-up was reassuring including normal baseline CT head, normal labs and stable cardiac markers we decided to let you go home.  You should follow-up with your neurologist next week.  Return to the ER for any other concerns.

## 2018-10-27 NOTE — ED Provider Notes (Signed)
-----------------------------------------   6:01 PM on 10/27/2018 -----------------------------------------  Blood pressure (!) 115/98, pulse (!) 55, resp. rate 12, height 5\' 10"  (1.778 m), weight 53.9 kg, SpO2 100 %.  Assuming care from Dr. Jari Pigg.  In short, Alvin Hudson is a 83 y.o. male with a chief complaint of Altered Mental Status .  Refer to the original H&P for additional details.  The current plan of care is to follow-up repeat troponin, if this is not significantly changed from initial, family wishes for patient to be discharged home.  Repeat troponin within normal limits and daughter continues to express desire that patient be discharged home rather than be admitted for further syncope work-up.  Patient appears to be at his baseline mental status, counseled daughter to have him follow-up with his PCP and return to the ED for new or worsening symptoms.  Daughter agrees with plan.    Blake Divine, MD 10/27/18 [phone]

## 2018-10-27 NOTE — ED Notes (Signed)
Peripheral IV discontinued. Catheter intact. No signs of infiltration or redness. Gauze applied to IV site.   Peripheral IV discontinued. Catheter intact. No signs of infiltration or redness. Gauze applied to IV site.   Discharge instructions reviewed with patient. Questions fielded by this RN. Patient verbalizes understanding of instructions. Patient discharged home in stable condition per [facility]. No acute distress noted at time of discharge.   Report called prior

## 2018-10-27 NOTE — ED Notes (Signed)
Daughter "Charleston Ropes" at bedside

## 2018-10-27 NOTE — ED Notes (Signed)
Pt cleaned up from BM and urination, new diaper and chux applied

## 2018-10-27 NOTE — ED Triage Notes (Signed)
Pt to ED from Physical therapy after staff reports pt went from standing to sitting and suddenly had a blank stare on his face before his eyes rolled back in his head. Pt became unresponsive to staff for a short but unknown period of time. Pt is currently altered from his baseline per [facility] staff. Pt has a hx of CVA and hemiparesis affecting the right side. Right grip is notably weaker than left upon arrival. LKW reported to be 1050 today.   [facility] sent pt for evaluation of Stroke or Seizure.

## 2018-10-27 NOTE — ED Notes (Signed)
Lab contacted regarding pt's troponin, lab states that they are running pt's troponin at this time

## 2018-10-30 LAB — LEVETIRACETAM LEVEL: Levetiracetam Lvl: 52.6 ug/mL — ABNORMAL HIGH (ref 10.0–40.0)

## 2018-11-26 DEATH — deceased

## 2021-01-15 IMAGING — MR MR HEAD WO/W CM
11 of 14 series · 31 of 48 positions shown · IV contrast (Yes   MULTIHANCE)
Comparison: Head CT 03/12/2018

CLINICAL DATA: Altered mental status

EXAM:
MRI HEAD WITHOUT CONTRAST
MRA HEAD WITHOUT CONTRAST
TECHNIQUE: Multiplanar, multiecho pulse sequences of the brain and surrounding
structures were obtained without intravenous contrast. Angiographic
images of the head were obtained using MRA technique without
contrast.

[Series 9: DWI · axial · 3.0mm · 1.09mm/px · z∈[-80,+68]mm · 6 of 104 slices shown (1 of 4)]
[im 1/104]
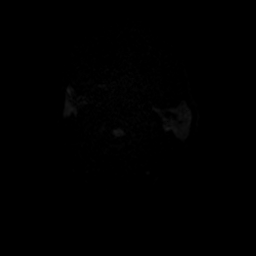
[im 21/104]
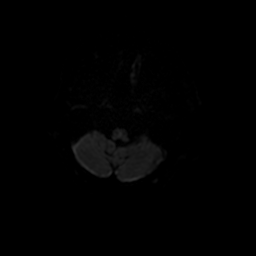
[im 42/104]
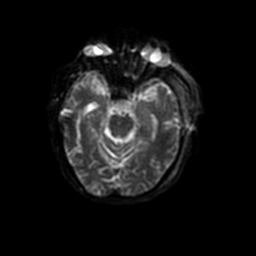
[im 62/104]
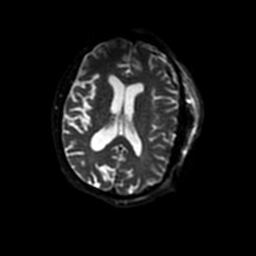
[im 83/104]
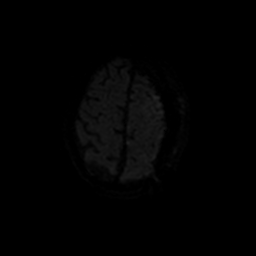
[im 104/104]
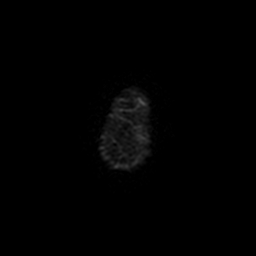

[Series 10: (id) mt fs · axial · 1.4mm · 0.43mm/px · z∈[-61,-38]mm · 3 of 136 slices shown]
[im 1/136]
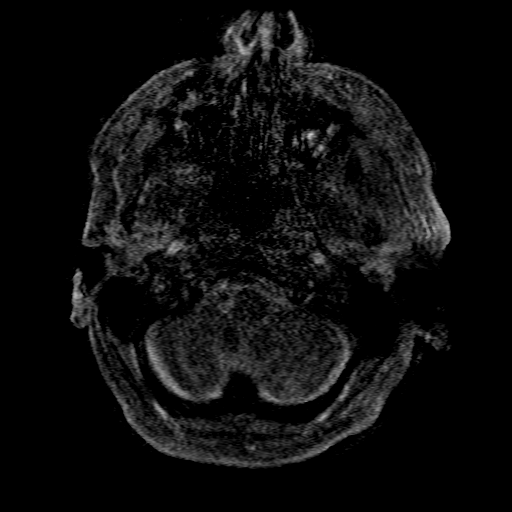
[im 17/136]
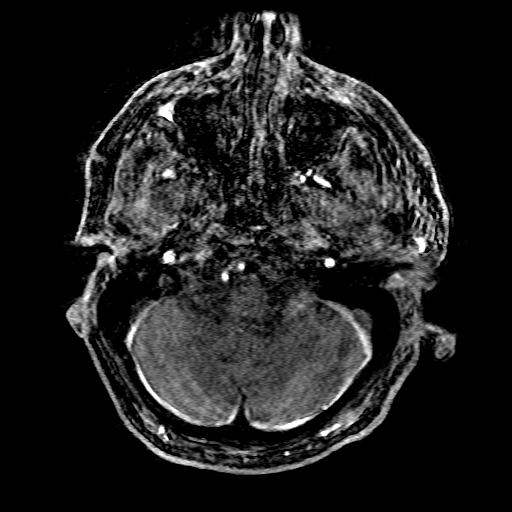
[im 34/136]
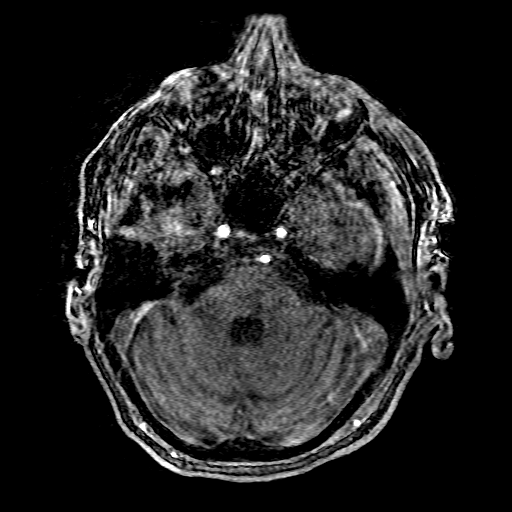

[Series 11: DWI · coronal · 5.0mm · 1.09mm/px · 5 of 74 slices shown (2 of 4)]
[im 1/74]
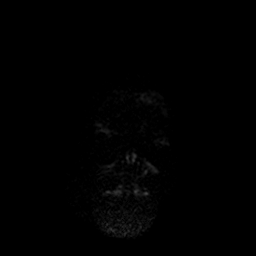
[im 19/74]
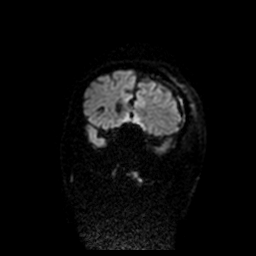
[im 37/74]
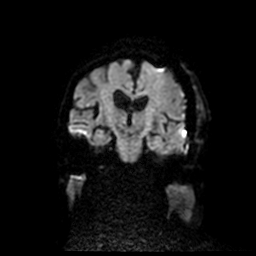
[im 55/74]
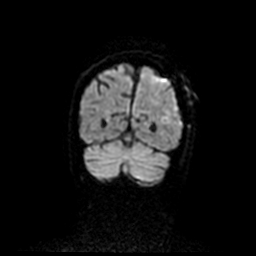
[im 74/74]
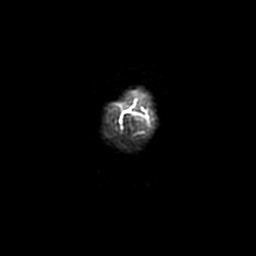

[Series 12: T1 · sagittal · 5.0mm · 0.47mm/px · 2 of 25 slices shown]
[im 1/25]
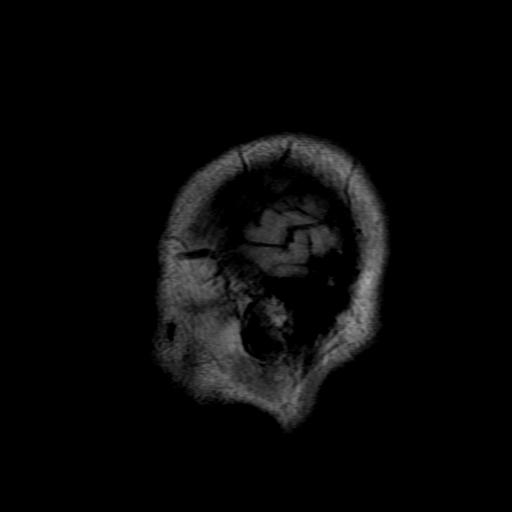
[im 25/25]
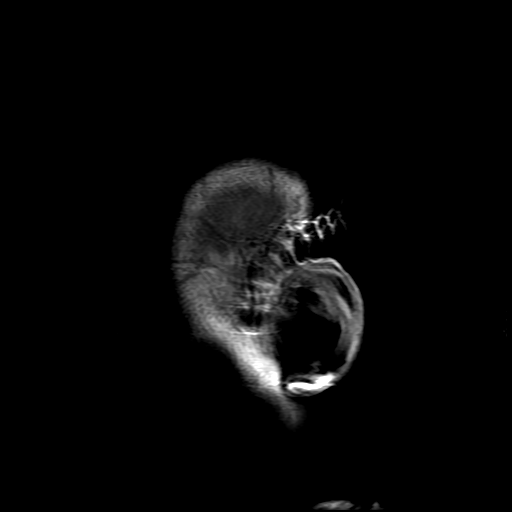

[Series 13: T2 · axial · 5.0mm · 0.43mm/px · z∈[-71,+67]mm · 2 of 25 slices shown]
[im 1/25]
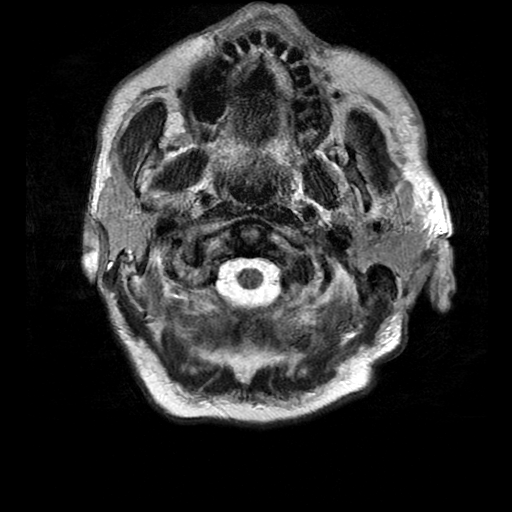
[im 25/25]
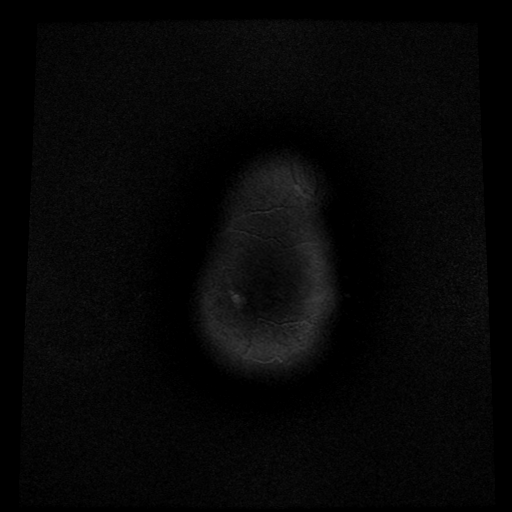

[Series 14: FLAIR · axial · 3.0mm · 0.43mm/px · z∈[-70,+69]mm · 2 of 25 slices shown]
[im 1/25]
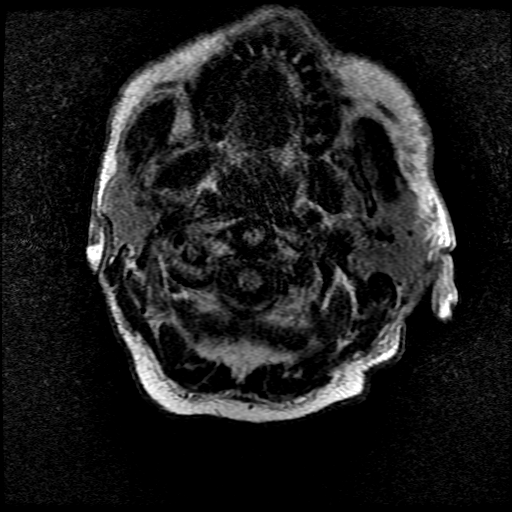
[im 25/25]
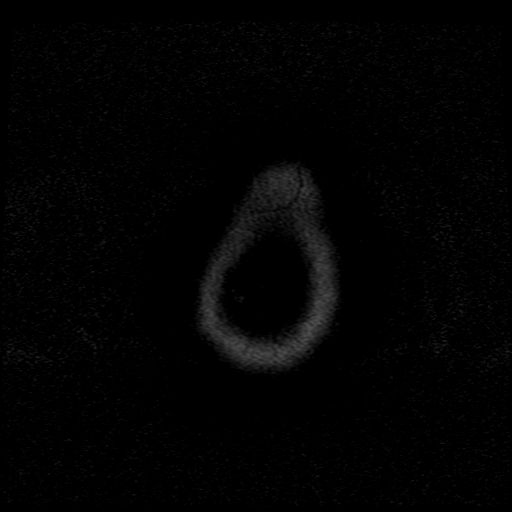

[Series 17: T2 post-contrast · coronal · 5.0mm · 0.39mm/px · 2 of 25 slices shown]
[im 1/25]
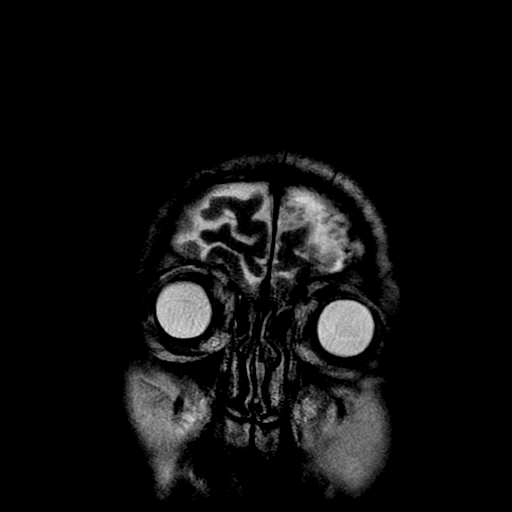
[im 25/25]
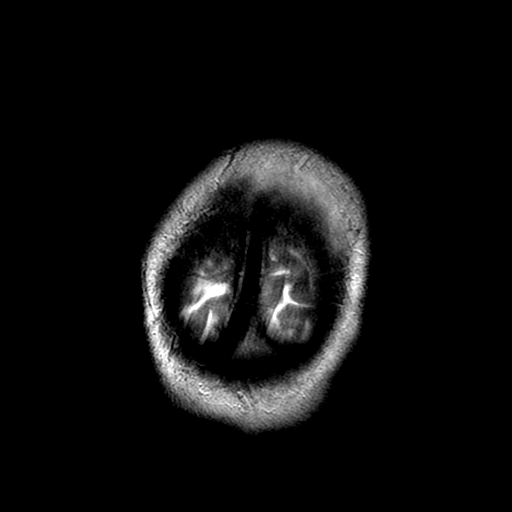

[Series 19: T1 post-contrast · coronal · 5.0mm · 0.39mm/px · 2 of 25 slices shown (1 of 2)]
[im 1/25]
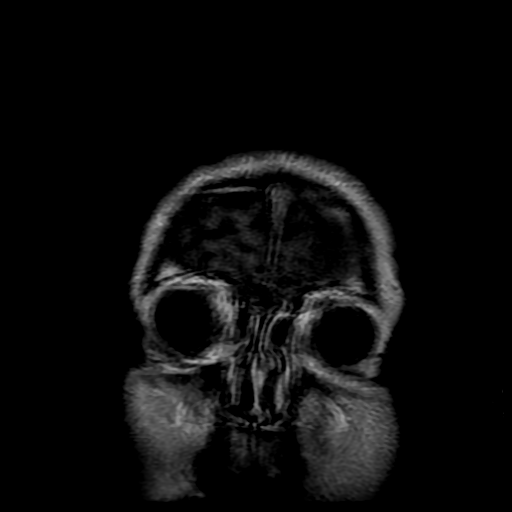
[im 25/25]
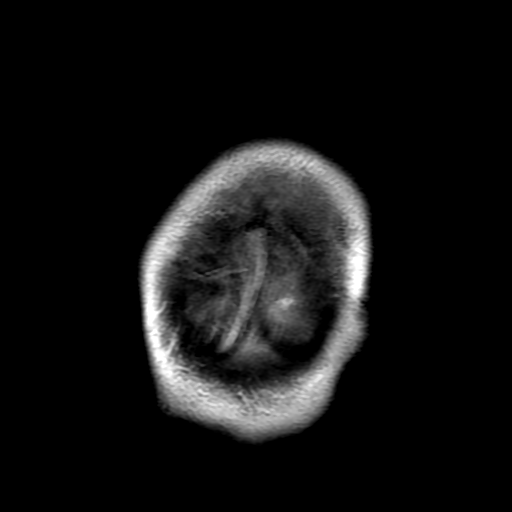

[Series 20: T1 post-contrast · sagittal · 5.0mm · 0.94mm/px · 2 of 25 slices shown (2 of 2)]
[im 1/25]
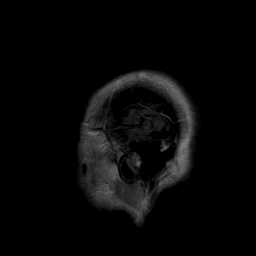
[im 25/25]
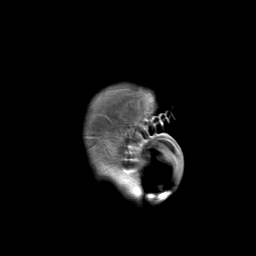

[Series 900: DWI · axial · 3.0mm · 1.09mm/px · z∈[-80,+68]mm · 3 of 52 slices shown (3 of 4)]
[im 1/52]
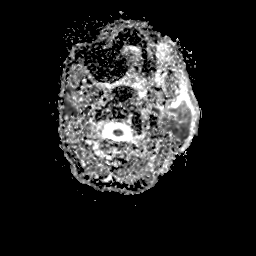
[im 26/52]
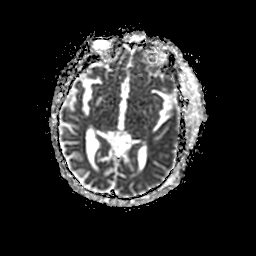
[im 52/52]
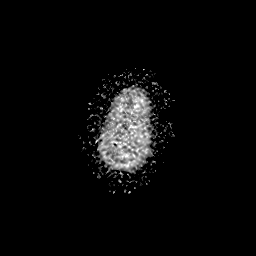

[Series 1100: DWI · coronal · 5.0mm · 1.09mm/px · 2 of 37 slices shown (4 of 4)]
[im 1/37]
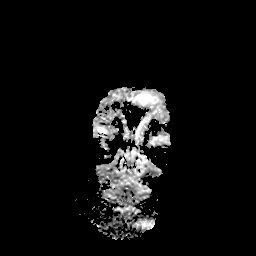
[im 37/37]
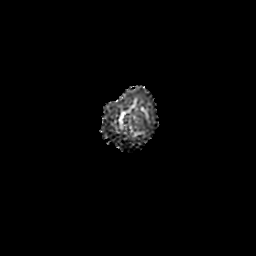

[31 of 48 positions shown; findings below may reference images not displayed]

FINDINGS: MRI HEAD FINDINGS

BRAIN: There are extra-axial blood products over the left convexity.
Multiple tiny foci of cortical diffusion weighted abnormality likely
due to the presence of nearby blood. No acute infarct is identified.
The left convexity collection measures up to 7 mm in thickness. The
midline structures are normal. There are no old infarcts. The white
matter signal is normal for the patient's age. Generalized atrophy
without lobar predilection. Susceptibility-sensitive sequences show
no chronic microhemorrhage or superficial siderosis.

SKULL AND UPPER CERVICAL SPINE: Status post left pterional
craniotomy.

SINUSES/ORBITS: No fluid levels or advanced mucosal thickening. No
mastoid or middle ear effusion. The orbits are normal.

MRA HEAD FINDINGS

POSTERIOR CIRCULATION:

--Basilar artery: Normal.

--Posterior cerebral arteries: Normal. Both originate from the
basilar artery.

--Superior cerebellar arteries: Normal.

--Inferior cerebellar arteries: Normal anterior and posterior
inferior cerebellar arteries.

ANTERIOR CIRCULATION:

--Intracranial internal carotid arteries: Normal.

--Anterior cerebral arteries: Normal. Both A1 segments are present.
Patent anterior communicating artery.

--Middle cerebral arteries: Multifocal moderate-to-severe
atherosclerotic narrowing of the M2 branches. Unremarkable right
MCA.

--Posterior communicating arteries: Absent bilaterally.
IMPRESSION: 1. 7 mm extra-axial hematoma over the left convexity, as previously
demonstrated on earlier head CT. No new site of hemorrhage. No acute
ischemia.
2. Multifocal moderate-to-severe stenosis of the left middle
cerebral artery M2 branches. No intracranial large vessel occlusion.
